# Patient Record
Sex: Male | Born: 1955 | Race: Black or African American | Hispanic: No | Marital: Single | Smoking: Never smoker
Health system: Southern US, Community
[De-identification: ages and names within clinical notes are randomized; demographics above are authoritative.]

## PROBLEM LIST (undated history)

## (undated) DIAGNOSIS — I10 Essential (primary) hypertension: Secondary | ICD-10-CM

## (undated) DIAGNOSIS — I509 Heart failure, unspecified: Secondary | ICD-10-CM

---

## 2004-08-25 ENCOUNTER — Other Ambulatory Visit: Payer: Self-pay

## 2004-08-25 ENCOUNTER — Emergency Department: Payer: Self-pay | Admitting: Emergency Medicine

## 2010-03-12 ENCOUNTER — Emergency Department: Payer: Self-pay | Admitting: Emergency Medicine

## 2010-10-06 ENCOUNTER — Inpatient Hospital Stay: Payer: Self-pay | Admitting: Internal Medicine

## 2010-10-07 DIAGNOSIS — R7989 Other specified abnormal findings of blood chemistry: Secondary | ICD-10-CM

## 2010-10-07 DIAGNOSIS — R0602 Shortness of breath: Secondary | ICD-10-CM

## 2010-10-07 DIAGNOSIS — I517 Cardiomegaly: Secondary | ICD-10-CM

## 2010-10-23 ENCOUNTER — Encounter: Payer: Self-pay | Admitting: Cardiovascular Disease

## 2013-01-01 ENCOUNTER — Emergency Department: Payer: Self-pay | Admitting: Emergency Medicine

## 2013-01-01 LAB — COMPREHENSIVE METABOLIC PANEL
Albumin: 3.7 g/dL (ref 3.4–5.0)
Anion Gap: 7 (ref 7–16)
Bilirubin,Total: 0.9 mg/dL (ref 0.2–1.0)
Calcium, Total: 8.8 mg/dL (ref 8.5–10.1)
Co2: 28 mmol/L (ref 21–32)
Creatinine: 0.8 mg/dL (ref 0.60–1.30)
Osmolality: 280 (ref 275–301)
Potassium: 3.8 mmol/L (ref 3.5–5.1)
SGOT(AST): 220 U/L — ABNORMAL HIGH (ref 15–37)
Sodium: 141 mmol/L (ref 136–145)
Total Protein: 8.6 g/dL — ABNORMAL HIGH (ref 6.4–8.2)

## 2013-01-01 LAB — CBC WITH DIFFERENTIAL/PLATELET
Basophil #: 0.1 10*3/uL (ref 0.0–0.1)
Basophil %: 1.2 %
Eosinophil %: 1.8 %
Lymphocyte %: 20.5 %
Monocyte %: 12.8 %
Neutrophil %: 63.7 %
Platelet: 152 10*3/uL (ref 150–440)
RDW: 13.5 % (ref 11.5–14.5)

## 2013-01-01 LAB — TROPONIN I: Troponin-I: <0.02 ng/mL

## 2013-07-04 ENCOUNTER — Inpatient Hospital Stay: Payer: Self-pay | Admitting: Student

## 2013-07-04 LAB — COMPREHENSIVE METABOLIC PANEL
Alkaline Phosphatase: 79 U/L
Anion Gap: 7 (ref 7–16)
BUN: 8 mg/dL (ref 7–18)
Co2: 29 mmol/L (ref 21–32)
Creatinine: 0.98 mg/dL (ref 0.60–1.30)
Osmolality: 273 (ref 275–301)
Potassium: 3.6 mmol/L (ref 3.5–5.1)
SGOT(AST): 119 U/L — ABNORMAL HIGH (ref 15–37)
SGPT (ALT): 148 U/L — ABNORMAL HIGH (ref 12–78)
Total Protein: 8.5 g/dL — ABNORMAL HIGH (ref 6.4–8.2)

## 2013-07-04 LAB — CBC
HCT: 48.2 % (ref 40.0–52.0)
MCH: 32.6 pg (ref 26.0–34.0)
MCHC: 35.4 g/dL (ref 32.0–36.0)
Platelet: 140 10*3/uL — ABNORMAL LOW (ref 150–440)

## 2013-07-04 LAB — TROPONIN I: Troponin-I: <0.02 ng/mL

## 2013-07-04 LAB — CK TOTAL AND CKMB (NOT AT ARMC): CK, Total: 301 U/L — ABNORMAL HIGH (ref 35–232)

## 2013-07-05 LAB — CBC WITH DIFFERENTIAL/PLATELET
Basophil #: 0 10*3/uL (ref 0.0–0.1)
Basophil %: 0.6 %
Eosinophil #: 0 10*3/uL (ref 0.0–0.7)
HGB: 15.9 g/dL (ref 13.0–18.0)
Lymphocyte #: 0.3 10*3/uL — ABNORMAL LOW (ref 1.0–3.6)
Lymphocyte %: 4.7 %
MCHC: 34.6 g/dL (ref 32.0–36.0)
Monocyte #: 0 x10 3/mm — ABNORMAL LOW (ref 0.2–1.0)
Neutrophil #: 6.9 10*3/uL — ABNORMAL HIGH (ref 1.4–6.5)
RBC: 4.97 10*6/uL (ref 4.40–5.90)
RDW: 13.1 % (ref 11.5–14.5)
WBC: 7.3 10*3/uL (ref 3.8–10.6)

## 2013-07-05 LAB — COMPREHENSIVE METABOLIC PANEL
Albumin: 3.2 g/dL — ABNORMAL LOW (ref 3.4–5.0)
Alkaline Phosphatase: 68 U/L
Anion Gap: 9 (ref 7–16)
BUN: 11 mg/dL (ref 7–18)
Calcium, Total: 8.5 mg/dL (ref 8.5–10.1)
Co2: 24 mmol/L (ref 21–32)
Creatinine: 1.06 mg/dL (ref 0.60–1.30)
EGFR (African American): >60
EGFR (Non-African Amer.): >60
Potassium: 3.8 mmol/L (ref 3.5–5.1)
SGOT(AST): 95 U/L — ABNORMAL HIGH (ref 15–37)
SGPT (ALT): 137 U/L — ABNORMAL HIGH (ref 12–78)
Sodium: 135 mmol/L — ABNORMAL LOW (ref 136–145)
Total Protein: 8.3 g/dL — ABNORMAL HIGH (ref 6.4–8.2)

## 2013-07-05 LAB — TROPONIN I: Troponin-I: <0.02 ng/mL

## 2013-07-05 LAB — TSH: Thyroid Stimulating Horm: 0.599 u[IU]/mL

## 2014-01-20 ENCOUNTER — Inpatient Hospital Stay: Payer: Self-pay | Admitting: Internal Medicine

## 2014-01-20 LAB — COMPREHENSIVE METABOLIC PANEL
ALK PHOS: 70 U/L
Albumin: 3.6 g/dL (ref 3.4–5.0)
Anion Gap: 9 (ref 7–16)
BILIRUBIN TOTAL: 0.9 mg/dL (ref 0.2–1.0)
BUN: 12 mg/dL (ref 7–18)
CO2: 26 mmol/L (ref 21–32)
Calcium, Total: 8.5 mg/dL (ref 8.5–10.1)
Chloride: 106 mmol/L (ref 98–107)
Creatinine: 1.21 mg/dL (ref 0.60–1.30)
EGFR (African American): >60
EGFR (Non-African Amer.): >60
Glucose: 132 mg/dL — ABNORMAL HIGH (ref 65–99)
Osmolality: 283 (ref 275–301)
POTASSIUM: 3.3 mmol/L — AB (ref 3.5–5.1)
SGOT(AST): 161 U/L — ABNORMAL HIGH (ref 15–37)
SGPT (ALT): 197 U/L — ABNORMAL HIGH (ref 12–78)
Sodium: 141 mmol/L (ref 136–145)
Total Protein: 9.2 g/dL — ABNORMAL HIGH (ref 6.4–8.2)

## 2014-01-20 LAB — CBC
HCT: 50.4 % (ref 40.0–52.0)
HGB: 17.3 g/dL (ref 13.0–18.0)
MCH: 32.8 pg (ref 26.0–34.0)
MCHC: 34.3 g/dL (ref 32.0–36.0)
MCV: 96 fL (ref 80–100)
Platelet: 149 10*3/uL — ABNORMAL LOW (ref 150–440)
RBC: 5.26 10*6/uL (ref 4.40–5.90)
RDW: 13.9 % (ref 11.5–14.5)
WBC: 7.5 10*3/uL (ref 3.8–10.6)

## 2014-01-20 LAB — TROPONIN I
Troponin-I: <0.02 ng/mL
Troponin-I: <0.02 ng/mL

## 2014-01-20 LAB — MAGNESIUM: Magnesium: 1.5 mg/dL — ABNORMAL LOW

## 2014-01-20 LAB — CK-MB
CK-MB: 7.6 ng/mL — ABNORMAL HIGH (ref 0.5–3.6)
CK-MB: 7.6 ng/mL — ABNORMAL HIGH (ref 0.5–3.6)
CK-MB: 8.6 ng/mL — AB (ref 0.5–3.6)

## 2014-01-20 LAB — LIPASE, BLOOD: Lipase: 383 U/L (ref 73–393)

## 2014-01-20 LAB — PRO B NATRIURETIC PEPTIDE: B-Type Natriuretic Peptide: 196 pg/mL — ABNORMAL HIGH (ref 0–125)

## 2014-01-21 LAB — BASIC METABOLIC PANEL
Anion Gap: 8 (ref 7–16)
BUN: 19 mg/dL — AB (ref 7–18)
CREATININE: 1.03 mg/dL (ref 0.60–1.30)
Calcium, Total: 8.7 mg/dL (ref 8.5–10.1)
Chloride: 103 mmol/L (ref 98–107)
Co2: 25 mmol/L (ref 21–32)
EGFR (Non-African Amer.): >60
Glucose: 126 mg/dL — ABNORMAL HIGH (ref 65–99)
OSMOLALITY: 276 (ref 275–301)
POTASSIUM: 3.9 mmol/L (ref 3.5–5.1)
Sodium: 136 mmol/L (ref 136–145)

## 2014-01-21 LAB — CBC WITH DIFFERENTIAL/PLATELET
Basophil #: 0 10*3/uL (ref 0.0–0.1)
Basophil %: 0.3 %
EOS ABS: 0 10*3/uL (ref 0.0–0.7)
EOS PCT: 0 %
HCT: 48.9 % (ref 40.0–52.0)
HGB: 16.4 g/dL (ref 13.0–18.0)
Lymphocyte #: 1 10*3/uL (ref 1.0–3.6)
Lymphocyte %: 7.7 %
MCH: 32.8 pg (ref 26.0–34.0)
MCHC: 33.5 g/dL (ref 32.0–36.0)
MCV: 98 fL (ref 80–100)
Monocyte #: 0.4 x10 3/mm (ref 0.2–1.0)
Monocyte %: 2.6 %
Neutrophil #: 12 10*3/uL — ABNORMAL HIGH (ref 1.4–6.5)
Neutrophil %: 89.4 %
PLATELETS: 165 10*3/uL (ref 150–440)
RBC: 5 10*6/uL (ref 4.40–5.90)
RDW: 13.9 % (ref 11.5–14.5)
WBC: 13.4 10*3/uL — ABNORMAL HIGH (ref 3.8–10.6)

## 2014-01-24 LAB — CREATININE, SERUM
Creatinine: 1.06 mg/dL (ref 0.60–1.30)
EGFR (African American): >60

## 2014-11-03 NOTE — Discharge Summary (Signed)
PATIENT NAME:  Joel Harrell, [name] MR#:  [id number] DATE OF BIRTH:  05-Jun-1956  DATE OF ADMISSION:  01/20/2014 DATE OF DISCHARGE:  01/24/2014  PRIMARY CARE PHYSICIAN: [facility].   FINAL DIAGNOSES:  1.  Acute respiratory failure, which improved.  2.  Asthma exacerbation.  3.  Accelerated hypertension.  4.  Hypokalemia.  5.  Hypomagnesemia.   MEDICATIONS ON DISCHARGE: Include: 1.  Multivitamin 1 tablet daily. 2.  Lisinopril 10 mg daily.  3.  Coreg 6.25 mg twice a day. 4.  Albuterol CFC 1 puff 4 times a day as needed for wheezing and shortness of breath. 5.  Prednisone 10 mg 4 tablets day 1 and 2,  three tablets day 3, 4, and 5, two tablets day 5 and 6, one tablet day 7 and 8, then stop. 6.  Amlodipine 5 mg 1 tablet daily. 7.  Ceftin 500 mg twice a day for 5 more days. 8.  Flovent CFC 220 mcg/inhalations 1 puff twice a day.   DIET: Low sodium diet, regular consistency.   ACTIVITY: As tolerated.   FOLLOW-UP: Follow up in [facility] weeks in [facility].   HOSPITAL COURSE: The patient was admitted 01/20/2014, discharged 01/24/2014. Came in with shortness of breath, was admitted with acute respiratory failure on BiPAP for asthma exacerbation and started on high-dose Solu-Medrol, Rocephin, Zithromax, and nebulizer treatments for his hypokalemia. This was replaced orally.  Hypomagnesemia replaced orally. 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   LABORATORY AND RADIOLOGICAL DATA: During the hospital course included an EKG that showed sinus tachycardia, left atrial enlargement. BNP 196. Troponin negative. Magnesium 1.5, lipase 383. White blood cell count 7.5, hemoglobin and hematocrit 7.3 and 50.4, platelet count of 149,000. Glucose 132, BUN 12, creatinine 1.21, sodium 141, potassium 3.3, chloride 106, CO2 26, calcium 8.5. Liver function tests:  ALT elevated at 197, AST up at 161, total protein 9.2,  albumin 3.6. ABG shows a pH of 7.43, pCO2 36, pO2 79, bicarbonate 23.9, oxygen saturation 97.6 and that was on BiPAP. Chest x-ray showed no active disease. Troponin negative. Creatinine upon discharge 1.06.   HOSPITAL COURSE PER PROBLEM LIST:  1.  For the patient's acute respiratory failure, initially requiring BiPAP in order to move air, the patient had improved to the time of discharge where he was not requiring oxygen at all.   2.  Asthma exacerbation: Patient was not moving air upon admission.  Upon discharge was moving better air. Slight expiratory wheeze. The patient was on high-dose steroids  throughout the entire hospital course, and will be put on a steroid taper, given a Flovent and albuterol inhaler upon discharge.   3.  Accelerated hypertension, likely secondary to respiratory failure. I did add amlodipine to his lisinopril and Coreg.   4.  For his hypokalemia, this was replaced during the hospital stay.   5.  For his hypomagnesemia, this was replaced during the hospital stay.   TIME SPENT ON DISCHARGE: 35 minutes.    ____________________________ Herschell Dimesichard J. Renae GlossWieting, MD rjw:ts D: 01/24/2014 16:15:21 ET T: 01/24/2014 17:19:17 ET JOB#: [id number]  cc: Herschell Dimesichard J. Renae GlossWieting, MD, <Dictator> [facility] Deitra Craine Jeanella AntonJ Vale Peraza MD ELECTRONICALLY SIGNED 01/25/2014 12:41

## 2014-11-03 NOTE — Discharge Summary (Signed)
PATIENT NAME:  Joel Harrell, Joel Harrell MR#:  [id number] DATE OF BIRTH:  17-Mar-1956  DATE OF ADMISSION:  07/04/2013 DATE OF DISCHARGE:  07/06/2013  PRIMARY CARE PHYSICIAN:  None.   CHIEF COMPLAINT:  Shortness of breath, palpitations.   DISCHARGE DIAGNOSES:  1.  Acute on chronic obstructive pulmonary disease/asthma exacerbation.  2.  Tachycardia, sinus in nature, likely secondary to epinephrine use.  3.  Chronic diastolic congestive heart failure with ejection fraction of 45% to 50%.  4.  Hypertension.  5.  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   DISCHARGE MEDICATIONS:  Multivitamin 1 tab daily, lisinopril 10 mg daily, Coreg 6.25 mg 2 times a day, Advair 250/50 mcg 1 puff 2 times a day, prednisone taper 50 mg daily and taper by 10 mg until done in 5 days, albuterol 2 puffs 4 times a day as needed for wheezing and shortness of breath, azithromycin 3 pack 500 mg once a day.   DIET:  Low sodium.   ACTIVITY:  As tolerated.   FOLLOWUP:  Please follow with [facility] within 1 to 2 weeks.   SIGNIFICANT LABORATORIES AND IMAGING:  Initial BUN 8, creatinine 0.98, BNP 460. AST 119, ALT 148. Troponin is negative x3. TSA was 0.599. White count of 6 on admission, hemoglobin 17, platelets 140. An x-ray of the chest, PA/lateral, showing no acute cardiopulmonary abnormalities.   HISTORY OF PRESENT ILLNESS AND HOSPITAL COURSE:  For full details of H and P, please see the dictation on December 23rd by Dr. Elisabeth PigeonVachhani, but briefly this is a 59 year old asthmatic with history of COPD, ongoing tobacco, who also has a history of systolic CHF, who has not followed with a physician for several months, ran out of medications, and comes in with palpitations. Of note, he did not have any more inhalers. Previously, he was on Advair and albuterol, and went to [employer], got over-the-counter racemic epinephrine inhalers, took 6 to 7 inhalations since the day prior to admission. He started to have some jitteriness, tachycardia,  palpitations, and came into the hospital. Was noted to have sinus tachycardia, received Cardizem, as well as some metoprolol. He was admitted to the hospitalist service for acute on chronic COPD/asthma exacerbation, started on IV steroids and nebulizers, montelukast, and Levaquin, as well as oxygen. In regards to the palpitations, he was started on metoprolol. He was transitioned from metoprolol to Coreg given his CHF, and slowly he improved. He did not have any significant fevers or leukocytosis to suggest he had pneumonia. He did well over the next couple of days and was taken off of the oxygen, and at this point, has been on lisinopril and Coreg in regards to the chronic systolic CHF. His acute asthma/COPD exacerbation has significantly improved. He is not hypoxic, and at this point, he will be discharged with Ventolin and Advair with azithromycin and a prednisone taper. His palpitations have resolved, and he will be discharged on Coreg. In regards to his elevated LFTs, there are transaminases in nature, and this is likely secondary to chronic hep C, and he will be following with [facility] as an outpatient, as he has lost his insurance. His chronic systolic CHF is stable. He is not volume overloaded.   TOTAL TIME SPENT:  35 minutes.   CODE STATUS:  The patient is FULL CODE.    ____________________________ Krystal EatonShayiq Shaquinta Peruski, MD sa:ms D: 07/06/2013 15:30:41 ET T: 07/06/2013 19:08:05 ET JOB#: [id number]  cc: Krystal EatonShayiq Lea Walbert, MD, <Dictator> Krystal EatonSHAYIQ Natassja Ollis MD ELECTRONICALLY SIGNED 07/25/2013 11:18

## 2014-11-03 NOTE — H&P (Signed)
PATIENT NAME:  Joel Harrell, Joel Harrell MR#:  [id number] DATE OF BIRTH:  1955-09-22  DATE OF ADMISSION:  07/04/2013  PRIMARY CARE PHYSICIAN: None.   REFERRING EMERGENCY ROOM PHYSICIAN: Dr. Daryel November.   CHIEF COMPLAINT: Shortness of breath and palpitations.   HISTORY OF PRESENT ILLNESS: A 59 year old male with past history of asthma and secondhand smoke exposure, questionable COPD, CHF and hypertension, who lost his job a few months ago and so is not following with any doctor and not taking any medication. Started feeling some shortness of breath since yesterday afternoon. He did not have his albuterol inhaler, so he went to [employer] and got over-the-counter racemic epinephrine inhalers and he took almost 6 to 7 of inhalations since yesterday evening until today morning. His shortness of breath did not get relieved much, but he started feeling palpitations and anxious and shaking since then, and so decided to come to the Emergency Room. In the ER, he is given 1 injection of steroid and 1 nebulizer treatment with bronchodilator, but it did not help much. Given 1 injection of Cardizem to slow down his heart rate without much help, so admitted to medical service for further management. ER physician is giving 1 more injection of metoprolol to slow down his heart rate. On further questioning, he denies any similar episode in the recent past. Denies any fever, but states that he has been shaking since today morning.   REVIEW OF SYSTEMS:     CONSTITUTIONAL: Negative for fever, fatigue, weakness, pain or weight loss.  EYES: No blurring, double vision or redness.  EARS, NOSE, THROAT: No tinnitus, ear pain or hearing loss.  RESPIRATORY: No cough, wheezing, hemoptysis, but has some shortness of breath.  CARDIOVASCULAR: No chest pain, orthopnea, edema or arrhythmia, but has palpitations.  GASTROINTESTINAL: No nausea, vomiting, diarrhea, abdominal pain.  GENITOURINARY: No dysuria, hematuria or increased frequency.   ENDOCRINE: No increased sweating. No heat or cold intolerance.  SKIN: No acne, rashes or any lesions on the skin.  MUSCULOSKELETAL: No pain or swelling in the joints.  NEUROLOGICAL: No numbness, weakness or vertigo, but has tremors and shaking.  PSYCHIATRIC: Not any history of anxiety, insomnia or bipolar disorder, but feeling a little anxious today.   PAST MEDICAL HISTORY: Asthma, CHF and hypertension, questionable COPD due to secondhand smoke exposure.   MEDICATIONS. Currently, at home, none.  ALLERGIES: None.   PAST SURGICAL HISTORY: None.   FAMILY HISTORY: Father died at 14 of MI. Mother died of unknown cancer at age 80. Brother had MI at 11 years of age and some kind of cancer. He is undergoing chemotherapy.   SOCIAL HISTORY: Has a monogamous girlfriend. He has 3 children. Does not smoke. No drink. Does not use any illegal drug use. 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   PHYSICAL EXAMINATION:  VITAL SIGNS:  In the ER, temperature 98.8, pulse 133, respirations 24 to 30 and blood pressure went up to 194/123, which came down to 160/110. Oxygen saturation 94 on 2 liters nasal cannula supplementation.  GENERAL: The patient is alert and oriented to time, place and person, appeared anxious and shaking.  HEENT: Head and neck atraumatic. Conjunctiva pink. Oral mucosa moist.  NECK: Supple. No JVD.  RESPIRATORY: Bilateral equal air entry. Some wheezing present. No crepitations.  CARDIOVASCULAR: S1, S2 present. Tachycardia, regular. No murmur appreciated.  SKIN: No rashes.  ABDOMEN: Soft, nontender. Bowel sounds present. No organomegaly.  EXTREMITIES:  Legs: No edema.  NEUROLOGICAL: Power 5/5. Follows commands.  Moves all 4 limbs. Overall shaking in limbs.  MUSCULOSKELETAL:  Joints: No swelling or tenderness.  PSYCHIATRIC: Appears anxious currently.   IMPORTANT LABORATORY RESULTS: BNP 460. Glucose 115, BUN 8, creatinine 0.98, sodium 137, potassium 3.6, chloride 101 and  CO2 of 29. Calcium is 8.5. Total protein 8.5, albumin 3.4, bilirubin total 0.9, alkaline phosphatase 79, SGOT 119, SGPT 148. Troponin less than 0.02.   WBC 6.0, hemoglobin 17, platelet count 140 and MCV 92. Chest x-ray, PA and lateral, no acute cardiopulmonary abnormality seen. EKG shows sinus tachycardia with a heart rate of 131.   ASSESSMENT AND PLAN: A 59 year old male with history of asthma and questionable chronic obstructive pulmonary disease, secondhand smoke exposure, congestive heart failure, hypertension, who came to the Emergency Room after feeling short of breath since yesterday and has used racemic epinephrine inhalation multiple times over the counter since yesterday, now feeling anxious and palpitations. Heart rate is ranging 130 to 140, supraventricular tachycardia.  1.  Chronic obstructive pulmonary disease/asthma exacerbation. We will give IV steroid inhaled  nebulizers and montelukast, and we will also give Levaquin to him and oxygen on as needed basis.  2.  Palpitations and tachycardia, appears to be having anxiety secondary to excessive epinephrine use. There is no effect to Cardizem injection. We will give metoprolol injection, and then put him on oral metoprolol. We will follow troponins and monitor him on telemetry.  3.  History of congestive heart failure. Ejection fraction 45%. Currently no finding of exacerbation. Stable symptoms. Not taking any medication. We will give beta blockers.  4.  Hypertension. We will put him on metoprolol for further management.   CODE STATUS: FULL CODE.   TOTAL TIME SPENT: 50 minutes in this admission.   We will still have to evaluate the effect of metoprolol injection. If not able to control, then we might need to start him on IV drip to control his heart rate better and his anxiety symptoms. We will wait for the response and decide later on.    ____________________________ Hope PigeonVaibhavkumar G. Elisabeth PigeonVachhani, MD vgv:dmm D: 07/04/2013 21:37:10  ET T: 07/04/2013 22:03:02 ET JOB#: [id number]  cc: Hope PigeonVaibhavkumar G. Elisabeth PigeonVachhani, MD, <Dictator> Altamese DillingVAIBHAVKUMAR Bartley Vuolo MD ELECTRONICALLY SIGNED 07/16/2013 21:56

## 2014-11-03 NOTE — H&P (Signed)
PATIENT NAME:  Joel Harrell, [place] MR#:  [id number] DATE OF BIRTH:  [birth year]  DATE OF ADMISSION:  01/20/2014  PRIMARY CARE PHYSICIAN:  None.   REFERRING PHYSICIAN:  Dr. York CeriseForbach.   CHIEF COMPLAINT:  Shortness of breath.   HISTORY OF PRESENT ILLNESS:  Joel Harrell is a 59 year old male with a history of hypertension, asthma, congestive heart failure who comes to the Emergency Department with complaints of severe shortness of breath.  The patient started to experience runny nose followed by cough with productive sputum.  The shortness of breath has been gradually getting worse.  Used multiple inhalers without much improvement.  Tonight experiencing severe shortness of breath.  Concerning this, called EMS and is brought to the Emergency Department.  The patient was in severe respiratory distress.  The patient was initially placed on BiPAP.  Received multiple breathing treatments and magnesium with some improvement.  The patient is able to speak full sentences currently.  The patient is also found to have low potassium of 3.3 and magnesium of 1.5.   PAST MEDICAL HISTORY: 1.  Asthma. 2.  Congestive heart failure.  3.  Hypertension.   PAST SURGICAL HISTORY:  None.   ALLERGIES:  No known drug allergies.   HOME MEDICATIONS: 1.  Multivitamin 1 tablet once a day.  2.  Lisinopril 10 mg daily.  3.  Coreg 6.25 mg 2 times a day.  4.  Albuterol 2 puffs 4 times a day as needed.   SOCIAL HISTORY:  No history of smoking.  Drinks alcohol two beers a day.  Denies using any illicit drugs.  Lives with his fiance.   FAMILY HISTORY:  Father died at the age of 61 with MI.  Mother died of unknown cancer at the age of 61.  Brother had an MI at the age of 256.   REVIEW OF SYSTEMS: CONSTITUTIONAL:  Has been experiencing generalized weakness.  EYES:  No change in vision.  EARS, NOSE, THROAT:  No change in hearing.  RESPIRATORY:  Has cough, shortness of breath.  CARDIOVASCULAR:  No chest pain, palpations.   GASTROINTESTINAL:  No nausea, vomiting, abdominal pain.  GENITOURINARY:  No dysuria or hematuria. HEMATOLOGY:  No easy bruising or bleeding.  SKIN:  No rash or lesions.  MUSCULOSKELETAL:  Has generalized body aches.  NEUROLOGIC:  No weakness or numbness in any part of the body.   PHYSICAL EXAMINATION: GENERAL:  This is a well-built, well-nourished, age-appropriate male lying down in the bed, not in distress, is able to speak full sentences.  VITAL SIGNS:  Temperature 98.9, pulse 124, blood pressure 155/112, respiratory rate of 24, oxygen saturation is 99% on BiPAP. HEENT:  Head normocephalic, atraumatic.  Eyes, no scleral icterus.  Conjunctivae normal.  Pupils equal and react to light.  Extraocular movements are intact.  Mucous membranes moist.  No pharyngeal erythema.  NECK:  Supple.  No lymphadenopathy.  No JVD.  No carotid bruit.  No thyromegaly.  CHEST:  Has no focal tenderness.  Bilateral diffuse wheezing, prolonged expiratory phase.  HEART:  S1, S2, regular, tachycardia.  ABDOMEN:  Bowel sounds plus.  Soft, nontender, nondistended.  No hepatosplenomegaly.  EXTREMITIES:  No pedal edema.  Pulses 2+.  SKIN:  No rash or lesions.  MUSCULOSKELETAL:  Good range of motion in all the extremities.  NEUROLOGIC:  The patient is alert, oriented to place, person, and time.  Cranial nerves II through XII intact.  Motor 5 by 5 in upper and lower extremities.   LABORATORY DATA:  ABGs  well within normal limits except for PaO2 of 79 on BiPAP.  Chest x-ray, one view portable:  No active disease of the chest.  Magnesium 1.5.  CBC is completely within normal limits.  CMP:  Potassium 3.3.  The rest of all the values are within normal limits.   ASSESSMENT AND PLAN:  Joel Harrell is a 59 year old with a known history of asthma comes with asthma exacerbation.  1.  Asthma exacerbation.  The patient is currently on BiPAP.  Continue with breathing treatments, Solu-Medrol.  Keep the patient on Rocephin and Zithromax  considering the patient's having productive sputum.  2.  Hypokalemia.  We will replace by mouth.  3.  Hypomagnesemia.  The patient is receiving currently magnesium.  We will recheck.  4.  Hypertension, uncontrolled, could be secondary to stress.  We will continue the home medications of lisinopril and Coreg.  We will also keep the patient on labetalol.  If the patient continues to have uncontrolled blood pressure, consider starting the patient on amlodipine.  5.  Keep the patient on deep vein thrombosis prophylaxis with Lovenox.   TIME SPENT:  55 minutes.    ____________________________ Susa Griffins, MD pv:ea D: 01/20/2014 04:12:07 ET T: 01/20/2014 05:08:50 ET JOB#: [id number]  cc: Susa Griffins, MD, <Dictator> Susa Griffins MD ELECTRONICALLY SIGNED 01/24/2014 0:05

## 2015-12-28 ENCOUNTER — Emergency Department: Payer: Self-pay

## 2015-12-28 ENCOUNTER — Emergency Department
Admission: EM | Admit: 2015-12-28 | Discharge: 2015-12-28 | Disposition: A | Discharge status: Home / Self Care | Payer: Self-pay | Attending: Emergency Medicine | Admitting: Emergency Medicine

## 2015-12-28 ENCOUNTER — Encounter: Payer: Self-pay | Admitting: *Deleted

## 2015-12-28 DIAGNOSIS — Z79899 Other long term (current) drug therapy: Secondary | ICD-10-CM | POA: Insufficient documentation

## 2015-12-28 DIAGNOSIS — I509 Heart failure, unspecified: Secondary | ICD-10-CM | POA: Insufficient documentation

## 2015-12-28 DIAGNOSIS — Z7982 Long term (current) use of aspirin: Secondary | ICD-10-CM | POA: Insufficient documentation

## 2015-12-28 DIAGNOSIS — J45901 Unspecified asthma with (acute) exacerbation: Secondary | ICD-10-CM | POA: Insufficient documentation

## 2015-12-28 LAB — CBC WITH DIFFERENTIAL/PLATELET
BASOS ABS: 0 10*3/uL (ref 0–0.1)
Basophils Relative: 1 %
Eosinophils Absolute: 0.4 10*3/uL (ref 0–0.7)
Eosinophils Relative: 9 %
HEMATOCRIT: 48.4 % (ref 40.0–52.0)
HEMOGLOBIN: 17.1 g/dL (ref 13.0–18.0)
Lymphocytes Relative: 29 %
Lymphs Abs: 1.4 10*3/uL (ref 1.0–3.6)
MCH: 32.1 pg (ref 26.0–34.0)
MCHC: 35.4 g/dL (ref 32.0–36.0)
MCV: 90.7 fL (ref 80.0–100.0)
MONO ABS: 0.5 10*3/uL (ref 0.2–1.0)
Monocytes Relative: 11 %
NEUTROS ABS: 2.3 10*3/uL (ref 1.4–6.5)
Neutrophils Relative %: 50 %
Platelets: 175 10*3/uL (ref 150–440)
RBC: 5.34 MIL/uL (ref 4.40–5.90)
RDW: 12.9 % (ref 11.5–14.5)
WBC: 4.6 10*3/uL (ref 3.8–10.6)

## 2015-12-28 LAB — COMPREHENSIVE METABOLIC PANEL
ALBUMIN: 3.9 g/dL (ref 3.5–5.0)
ALK PHOS: 67 U/L (ref 38–126)
ALT: 115 U/L — AB (ref 17–63)
ANION GAP: 10 (ref 5–15)
AST: 98 U/L — ABNORMAL HIGH (ref 15–41)
BUN: 9 mg/dL (ref 6–20)
CALCIUM: 8.8 mg/dL — AB (ref 8.9–10.3)
CO2: 22 mmol/L (ref 22–32)
Chloride: 103 mmol/L (ref 101–111)
Creatinine, Ser: 0.99 mg/dL (ref 0.61–1.24)
GFR calc Af Amer: >60 mL/min (ref >60)
GFR calc non Af Amer: >60 mL/min (ref >60)
GLUCOSE: 116 mg/dL — AB (ref 65–99)
Potassium: 3.4 mmol/L — ABNORMAL LOW (ref 3.5–5.1)
SODIUM: 135 mmol/L (ref 135–145)
Total Bilirubin: 0.9 mg/dL (ref 0.3–1.2)
Total Protein: 8.4 g/dL — ABNORMAL HIGH (ref 6.5–8.1)

## 2015-12-28 LAB — BRAIN NATRIURETIC PEPTIDE: B Natriuretic Peptide: 10 pg/mL (ref 0.0–100.0)

## 2015-12-28 LAB — TROPONIN I: Troponin I: <0.03 ng/mL (ref <0.031)

## 2015-12-28 LAB — MAGNESIUM: MAGNESIUM: 1.7 mg/dL (ref 1.7–2.4)

## 2015-12-28 MED ORDER — PREDNISONE 20 MG PO TABS: 40.0000 mg | ORAL_TABLET | Freq: Every day | ORAL | Status: DC

## 2015-12-28 MED ORDER — IPRATROPIUM-ALBUTEROL 0.5-2.5 (3) MG/3ML IN SOLN
3.0000 mL | Freq: Once | RESPIRATORY_TRACT | Status: AC
  Administered 2015-12-28: 3 mL via RESPIRATORY_TRACT
  Filled 2015-12-28: qty 3

## 2015-12-28 MED ORDER — METHYLPREDNISOLONE SODIUM SUCC 125 MG IJ SOLR
125.0000 mg | Freq: Once | INTRAMUSCULAR | Status: AC
  Administered 2015-12-28: 125 mg via INTRAVENOUS
  Filled 2015-12-28: qty 2

## 2015-12-28 MED ORDER — MONTELUKAST SODIUM 10 MG PO TABS: 10.0000 mg | ORAL_TABLET | Freq: Every day | ORAL | Status: DC

## 2015-12-28 MED ORDER — LISINOPRIL 20 MG PO TABS: 20.0000 mg | ORAL_TABLET | Freq: Every day | ORAL | Status: DC

## 2015-12-28 MED ORDER — ALBUTEROL SULFATE HFA 108 (90 BASE) MCG/ACT IN AERS: 2.0000 | INHALATION_SPRAY | Freq: Four times a day (QID) | RESPIRATORY_TRACT | Status: DC | PRN

## 2015-12-28 MED ORDER — ALBUTEROL SULFATE (2.5 MG/3ML) 0.083% IN NEBU: 2.5000 mg | INHALATION_SOLUTION | Freq: Four times a day (QID) | RESPIRATORY_TRACT | Status: DC | PRN

## 2015-12-28 NOTE — ED Notes (Signed)
Pt reports dizziness for 1 week , pt reports feeling short of breath, pt has a history of asthma

## 2015-12-28 NOTE — ED Provider Notes (Signed)
Time Seen: Approximately 0 900 I have reviewed the triage notes  Chief Complaint: Dizziness and Chest Pain   History of Present Illness: Joel Harrell is a 60 y.o. male *who states a history of congestive heart failure and asthma. Patient states he's had some increased shortness of breath over the past week. He's developed some mild left-sided chest discomfort he states which is typical for him when he develops asthma. He denies any new chest discomfort. He states he felt somewhat lightheaded without any syncopal episode. He denies any calf tenderness or swelling and no new pulmonary emboli risk factors. He denies any arm job back or flank discomfort. Denies any fever or chills or productive cough. Past Medical History  Diagnosis Date  . Asthma     There are no active problems to display for this patient.   History reviewed. No pertinent past surgical history.  History reviewed. No pertinent past surgical history.  Current Outpatient Rx  Name  Route  Sig  Dispense  Refill  . albuterol (PROVENTIL) (2.5 MG/3ML) 0.083% nebulizer solution   Nebulization   Take 2.5 mg by nebulization every 6 (six) hours as needed.           Marland Kitchen albuterol (PROVENTIL,VENTOLIN) 90 MCG/ACT inhaler   Inhalation   Inhale 2 puffs into the lungs every 6 (six) hours as needed.           Marland Kitchen aspirin 81 MG EC tablet   Oral   Take 81 mg by mouth daily.           . budesonide-formoterol (SYMBICORT) 160-4.5 MCG/ACT inhaler   Inhalation   Inhale 2 puffs into the lungs 2 (two) times daily.           Marland Kitchen lisinopril (PRINIVIL,ZESTRIL) 20 MG tablet   Oral   Take 20 mg by mouth daily.           . montelukast (SINGULAIR) 10 MG tablet   Oral   Take 10 mg by mouth at bedtime.           Marland Kitchen omeprazole (PRILOSEC) 20 MG capsule   Oral   Take 20 mg by mouth daily.           . predniSONE (DELTASONE) 20 MG tablet   Oral   Take 60 mg by mouth daily. ruce to  every 2 days until done             Allergies:  Review of patient's allergies indicates no known allergies.  Family History: Family History  Problem Relation Age of Onset  . Heart attack Father   . Cancer Mother     Social History: Social History  Substance Use Topics  . Smoking status: Never Smoker   . Smokeless tobacco: Never Used  . Alcohol Use: 4.2 oz/week    7 Cans of beer per week     Review of Systems:   10 point review of systems was performed and was otherwise negative:  Constitutional: No fever Eyes: No visual disturbances ENT: No sore throat, ear pain Cardiac: No chest pain Respiratory: Increasing shortness of breath and he has been using his inhaler at home. He is currently not on any steroid therapy. Abdomen: No abdominal pain, no vomiting, No diarrhea Endocrine: No weight loss, No night sweats Extremities: No peripheral edema, cyanosis Skin: No rashes, easy bruising Neurologic: No focal weakness, trouble with speech or swollowing Urologic: No dysuria, Hematuria, or urinary frequency   Physical Exam:  ED Triage Vitals  Enc Vitals Group     BP 12/28/15 0845 146/90 mmHg     Pulse Rate 12/28/15 0845 109     Resp 12/28/15 0845 20     Temp 12/28/15 0845 98.8 F (37.1 C)     Temp Source 12/28/15 0845 Oral     SpO2 12/28/15 0845 96 %     Weight 12/28/15 0843 232 lb (105.235 kg)     Height 12/28/15 0843 6\' 2"  (1.88 m)     Head Cir --      Peak Flow --      Pain Score 12/28/15 0841 6     Pain Loc --      Pain Edu? --      Excl. in GC? --     General: Awake , Alert , and Oriented times 3; GCS 15 Head: Normal cephalic , atraumatic Eyes: Pupils equal , round, reactive to light Nose/Throat: No nasal drainage, patent upper airway without erythema or exudate.  Neck: Supple, Full range of motion, No anterior adenopathy or palpable thyroid masses Lungs: Patient has an expiratory wheezing auscultated in all lung fields without rales or rhonchi Heart: Regular rate, regular rhythm without  murmurs , gallops , or rubs Abdomen: Soft, non tender without rebound, guarding , or rigidity; bowel sounds positive and symmetric in all 4 quadrants. No organomegaly .        Extremities: 2 plus symmetric pulses. No edema, clubbing or cyanosis Neurologic: normal ambulation, Motor symmetric without deficits, sensory intact Skin: warm, dry, no rashes   Labs:   All laboratory work was reviewed including any pertinent negatives or positives listed below:  Labs Reviewed  BRAIN NATRIURETIC PEPTIDE  COMPREHENSIVE METABOLIC PANEL  CBC WITH DIFFERENTIAL/PLATELET  TROPONIN I  MAGNESIUM  Laboratory work was reviewed and showed no clinically significant abnormalities. LFTs are slightly elevated.  EKG:  ED ECG REPORT I, Jennye MoccasinBrian S Quigley, the attending physician, personally viewed and interpreted this ECG.  Date: 12/28/2015 EKG Time: 0845 Rate: 105 Rhythm: Sinus tachycardia QRS Axis: normal Intervals: normal ST/T Wave abnormalities: normal Conduction Disturbances: none Narrative Interpretation: unremarkable No acute ischemic changes  Radiology: *      DG Chest Port 1 View (Final result) Result time: 12/28/15 09:36:25   Final result by Rad Results In Interface (12/28/15 09:36:25)   Narrative:   CLINICAL DATA: Shortness of breath.  EXAM: PORTABLE CHEST 1 VIEW  COMPARISON: Radiograph of January 20, 2014.  FINDINGS: The heart size and mediastinal contours are within normal limits. Both lungs are clear. No pneumothorax or pleural effusion is noted. The visualized skeletal structures are unremarkable.  IMPRESSION: No acute cardiopulmonary abnormality seen.   Electronically Signed By: Lupita RaiderJames Green Jr, M.D. On: 12/28/2015 09:36        I personally reviewed the radiologic studies  ED Course: ** Repeat exam shows clearing of majority of his wheezing. Patient admits that he is currently out of the vast majority of his medications at home. I prescribed him at least a  month's supply of 4 his nebulizer that he can use at home. He states he uses his sister's nebulizer. They apparently reside in the same location. Patient was also started on steroid therapy and was advised started the prescription for prednisone tomorrow. He was advised drink plenty of fluids and return here especially if he develops a fever, chest pain, increased shortness of breath or any other new concerns.  Assessment: Acute exacerbation of chronic asthma   F   Plan:  Outpatient management  Prescription for the patient's chronic blood pressure medication, asthma treatment, etc. Patient was advised to return immediately if condition worsens. Patient was advised to follow up with their primary care physician or other specialized physicians involved in their outpatient care. The patient and/or family member/power of attorney had laboratory results reviewed at the bedside. All questions and concerns were addressed and appropriate discharge instructions were distributed by the nursing staff.            Jennye Moccasin, MD 12/28/15 [phone]

## 2015-12-28 NOTE — ED Notes (Signed)
Reports mild dizziness and left side chest pain x 1 wk.  Some sob this am.  nodistress at this time, skin w/d with good color.  HS normal, mild wheezing noted. A&o, MAE

## 2015-12-28 NOTE — Discharge Instructions (Signed)
Asthma, Acute Bronchospasm °Acute bronchospasm caused by asthma is also referred to as an asthma attack. Bronchospasm means your air passages become narrowed. The narrowing is caused by inflammation and tightening of the muscles in the air tubes (bronchi) in your lungs. This can make it hard to breathe or cause you to wheeze and cough. °CAUSES °Possible triggers are: °· Animal dander from the skin, hair, or feathers of animals. °· Dust mites contained in house dust. °· Cockroaches. °· Pollen from trees or grass. °· Mold. °· Cigarette or tobacco smoke. °· Air pollutants such as dust, household cleaners, hair sprays, aerosol sprays, paint fumes, strong chemicals, or strong odors. °· Cold air or weather changes. Cold air may trigger inflammation. Winds increase molds and pollens in the air. °· Strong emotions such as crying or laughing hard. °· Stress. °· Certain medicines such as aspirin or beta-blockers. °· Sulfites in foods and drinks, such as dried fruits and wine. °· Infections or inflammatory conditions, such as a flu, cold, or inflammation of the nasal membranes (rhinitis). °· Gastroesophageal reflux disease (GERD). GERD is a condition where stomach acid backs up into your esophagus. °· Exercise or strenuous activity. °SIGNS AND SYMPTOMS  °· Wheezing. °· Excessive coughing, particularly at night. °· Chest tightness. °· Shortness of breath. °DIAGNOSIS  °Your health care provider will ask you about your medical history and perform a physical exam. A chest X-ray or blood testing may be performed to look for other causes of your symptoms or other conditions that may have triggered your asthma attack.  °TREATMENT  °Treatment is aimed at reducing inflammation and opening up the airways in your lungs.  Most asthma attacks are treated with inhaled medicines. These include quick relief or rescue medicines (such as bronchodilators) and controller medicines (such as inhaled corticosteroids). These medicines are sometimes  given through an inhaler or a nebulizer. Systemic steroid medicine taken by mouth or given through an IV tube also can be used to reduce the inflammation when an attack is moderate or severe. Antibiotic medicines are only used if a bacterial infection is present.  °HOME CARE INSTRUCTIONS  °· Rest. °· Drink plenty of liquids. This helps the mucus to remain thin and be easily coughed up. Only use caffeine in moderation and do not use alcohol until you have recovered from your illness. °· Do not smoke. Avoid being exposed to secondhand smoke. °· You play a critical role in keeping yourself in good health. Avoid exposure to things that cause you to wheeze or to have breathing problems. °· Keep your medicines up-to-date and available. Carefully follow your health care provider's treatment plan. °· Take your medicine exactly as prescribed. °· When pollen or pollution is bad, keep windows closed and use an air conditioner or go to places with air conditioning. °· Asthma requires careful medical care. See your health care provider for a follow-up as advised. If you are more than [redacted] weeks pregnant and you were prescribed any new medicines, let your obstetrician know about the visit and how you are doing. Follow up with your health care provider as directed. °· After you have recovered from your asthma attack, make an appointment with your outpatient doctor to talk about ways to reduce the likelihood of future attacks. If you do not have a doctor who manages your asthma, make an appointment with a primary care doctor to discuss your asthma. °SEEK IMMEDIATE MEDICAL CARE IF:  °· You are getting worse. °· You have trouble breathing. If severe, call your local   emergency services (911 in the [place]  You develop chest pain or discomfort.  You are vomiting.  You are not able to keep fluids down.  You are coughing up yellow, green, brown, or bloody sputum.  You have a fever and your symptoms suddenly get worse.  You have  trouble swallowing. MAKE SURE YOU:   Understand these instructions.  Will watch your condition.  Will get help right away if you are not doing well or get worse.   This information is not intended to replace advice given to you by your health care provider. Make sure you discuss any questions you have with your health care provider.   Document Released: 10/14/2006 Document Revised: 07/04/2013 Document Reviewed: 01/04/2013 [employer] Interactive Patient Education [employer]   Please return immediately if condition worsens. Please contact her primary physician or the physician you were given for referral. If you have any specialist physicians involved in her treatment and plan please also contact them. Thank you for using [facility].  Please continue with rescue inhalers at home. Prednisone prescription and is to start tomorrow.

## 2016-01-04 ENCOUNTER — Encounter: Payer: Self-pay | Admitting: Emergency Medicine

## 2016-01-04 ENCOUNTER — Other Ambulatory Visit: Payer: Self-pay

## 2016-01-04 DIAGNOSIS — Z7952 Long term (current) use of systemic steroids: Secondary | ICD-10-CM | POA: Insufficient documentation

## 2016-01-04 DIAGNOSIS — Z7982 Long term (current) use of aspirin: Secondary | ICD-10-CM | POA: Insufficient documentation

## 2016-01-04 DIAGNOSIS — J45901 Unspecified asthma with (acute) exacerbation: Secondary | ICD-10-CM | POA: Insufficient documentation

## 2016-01-04 DIAGNOSIS — R079 Chest pain, unspecified: Secondary | ICD-10-CM | POA: Insufficient documentation

## 2016-01-04 DIAGNOSIS — I509 Heart failure, unspecified: Secondary | ICD-10-CM | POA: Insufficient documentation

## 2016-01-04 DIAGNOSIS — I110 Hypertensive heart disease with heart failure: Secondary | ICD-10-CM | POA: Insufficient documentation

## 2016-01-04 NOTE — ED Notes (Signed)
Pt reports feeling dizzy, short of breath, wheezy and his blood pressure has been high. Pt reports seen here last week and put on lisinopril for his blood pressure. Pt did not follow up with a PCP because he could not afford to. Pt reports hx of htn and chf in the past.

## 2016-01-05 ENCOUNTER — Emergency Department
Admission: EM | Admit: 2016-01-05 | Discharge: 2016-01-05 | Disposition: A | Discharge status: Home / Self Care | Payer: Self-pay | Attending: Emergency Medicine | Admitting: Emergency Medicine

## 2016-01-05 ENCOUNTER — Emergency Department: Payer: Self-pay

## 2016-01-05 DIAGNOSIS — J45901 Unspecified asthma with (acute) exacerbation: Secondary | ICD-10-CM

## 2016-01-05 DIAGNOSIS — I10 Essential (primary) hypertension: Secondary | ICD-10-CM

## 2016-01-05 HISTORY — DX: Essential (primary) hypertension: I10

## 2016-01-05 LAB — URINALYSIS COMPLETE WITH MICROSCOPIC (ARMC ONLY)
Bacteria, UA: NONE SEEN
Bilirubin Urine: NEGATIVE
Glucose, UA: NEGATIVE mg/dL
HGB URINE DIPSTICK: NEGATIVE
KETONES UR: NEGATIVE mg/dL
Leukocytes, UA: NEGATIVE
NITRITE: NEGATIVE
PH: 6 (ref 5.0–8.0)
PROTEIN: NEGATIVE mg/dL
SPECIFIC GRAVITY, URINE: 1.011 (ref 1.005–1.030)

## 2016-01-05 LAB — CBC
HCT: 50.2 % (ref 40.0–52.0)
HEMOGLOBIN: 17.8 g/dL (ref 13.0–18.0)
MCH: 32.8 pg (ref 26.0–34.0)
MCHC: 35.5 g/dL (ref 32.0–36.0)
MCV: 92.6 fL (ref 80.0–100.0)
Platelets: 195 10*3/uL (ref 150–440)
RBC: 5.42 MIL/uL (ref 4.40–5.90)
RDW: 12.8 % (ref 11.5–14.5)
WBC: 6.1 10*3/uL (ref 3.8–10.6)

## 2016-01-05 LAB — BASIC METABOLIC PANEL
ANION GAP: 9 (ref 5–15)
BUN: 16 mg/dL (ref 6–20)
CALCIUM: 9.1 mg/dL (ref 8.9–10.3)
CO2: 30 mmol/L (ref 22–32)
Chloride: 99 mmol/L — ABNORMAL LOW (ref 101–111)
Creatinine, Ser: 0.99 mg/dL (ref 0.61–1.24)
Glucose, Bld: 98 mg/dL (ref 65–99)
Potassium: 3.5 mmol/L (ref 3.5–5.1)
Sodium: 138 mmol/L (ref 135–145)

## 2016-01-05 LAB — TROPONIN I: Troponin I: <0.03 ng/mL (ref <0.031)

## 2016-01-05 LAB — GLUCOSE, CAPILLARY: Glucose-Capillary: 103 mg/dL — ABNORMAL HIGH (ref 65–99)

## 2016-01-05 LAB — BRAIN NATRIURETIC PEPTIDE: B Natriuretic Peptide: 30 pg/mL (ref 0.0–100.0)

## 2016-01-05 MED ORDER — IPRATROPIUM-ALBUTEROL 0.5-2.5 (3) MG/3ML IN SOLN
3.0000 mL | Freq: Once | RESPIRATORY_TRACT | Status: AC
  Administered 2016-01-05: 3 mL via RESPIRATORY_TRACT
  Filled 2016-01-05: qty 3

## 2016-01-05 MED ORDER — ALBUTEROL SULFATE (2.5 MG/3ML) 0.083% IN NEBU
INHALATION_SOLUTION | RESPIRATORY_TRACT | Status: AC
  Filled 2016-01-05: qty 3

## 2016-01-05 MED ORDER — ACETAMINOPHEN 325 MG PO TABS
650.0000 mg | ORAL_TABLET | Freq: Once | ORAL | Status: AC
  Administered 2016-01-05: 650 mg via ORAL
  Filled 2016-01-05: qty 2

## 2016-01-05 NOTE — ED Notes (Signed)
Pt c/o of chest pain, first occurrence last week, and again Saturday (6/24) morning in the left chest described as sharp/pressure. Pt reports dizziness, diaphoresis, and SOB with onset of chest pain. Pt denies radiation, nausea/vomiting/weakness. Pt reports [facility] Wednesday for same symptoms and discharged with albuterol, singular, prednisone and lisinopril. Pt reports symptoms had completely resolved, however came back yesterday morning.

## 2016-01-05 NOTE — Discharge Instructions (Signed)
You have been seen in the Emergency Department (ED) today for shortness of breath and some chest pain.  As we have discussed todays test results are normal, but you may require further testing.  Please follow up with the recommended doctor as instructed above in these documents regarding todays emergent visit and your recent symptoms to discuss further management.  Continue to take your regular medications. If you are not doing so already, please also take a daily baby aspirin (81 mg), at least until you follow up with your doctor.  Return to the Emergency Department (ED) if you experience any further chest pain/pressure/tightness, difficulty breathing, or sudden sweating, or other symptoms that concern you.   Chest Pain (Nonspecific) It is often hard to give a specific diagnosis for the cause of chest pain. There is always a chance that your pain could be related to something serious, such as a heart attack or a blood clot in the lungs. You need to follow up with your health care provider for further evaluation. CAUSES   Heartburn.  Pneumonia or bronchitis.  Anxiety or stress.  Inflammation around your heart (pericarditis) or lung (pleuritis or pleurisy).  A blood clot in the lung.  A collapsed lung (pneumothorax). It can develop suddenly on its own (spontaneous pneumothorax) or from trauma to the chest.  Shingles infection (herpes zoster virus). The chest wall is composed of bones, muscles, and cartilage. Any of these can be the source of the pain.  The bones can be bruised by injury.  The muscles or cartilage can be strained by coughing or overwork.  The cartilage can be affected by inflammation and become sore (costochondritis). DIAGNOSIS  Lab tests or other studies may be needed to find the cause of your pain. Your health care provider may have you take a test called an ambulatory electrocardiogram (ECG). An ECG records your heartbeat patterns over a 24-hour period. You may  also have other tests, such as:  Transthoracic echocardiogram (TTE). During echocardiography, sound waves are used to evaluate how blood flows through your heart.  Transesophageal echocardiogram (TEE).  Cardiac monitoring. This allows your health care provider to monitor your heart rate and rhythm in real time.  Holter monitor. This is a portable device that records your heartbeat and can help diagnose heart arrhythmias. It allows your health care provider to track your heart activity for several days, if needed.  Stress tests by exercise or by giving medicine that makes the heart beat faster. TREATMENT   Treatment depends on what may be causing your chest pain. Treatment may include:  Acid blockers for heartburn.  Anti-inflammatory medicine.  Pain medicine for inflammatory conditions.  Antibiotics if an infection is present.  You may be advised to change lifestyle habits. This includes stopping smoking and avoiding alcohol, caffeine, and chocolate.  You may be advised to keep your head raised (elevated) when sleeping. This reduces the chance of acid going backward from your stomach into your esophagus. Most of the time, nonspecific chest pain will improve within 2-3 days with rest and mild pain medicine.  HOME CARE INSTRUCTIONS   If antibiotics were prescribed, take them as directed. Finish them even if you start to feel better.  For the next few days, avoid physical activities that bring on chest pain. Continue physical activities as directed.  Do not use any tobacco products, including cigarettes, chewing tobacco, or electronic cigarettes.  Avoid drinking alcohol.  Only take medicine as directed by your health care provider.  Follow your  health care provider's suggestions for further testing if your chest pain does not go away.  Keep any follow-up appointments you made. If you do not go to an appointment, you could develop lasting (chronic) problems with pain. If there is  any problem keeping an appointment, call to reschedule. SEEK MEDICAL CARE IF:   Your chest pain does not go away, even after treatment.  You have a rash with blisters on your chest.  You have a fever. SEEK IMMEDIATE MEDICAL CARE IF:   You have increased chest pain or pain that spreads to your arm, neck, jaw, back, or abdomen.  You have shortness of breath.  You have an increasing cough, or you cough up blood.  You have severe back or abdominal pain.  You feel nauseous or vomit.  You have severe weakness.  You faint.  You have chills. This is an emergency. Do not wait to see if the pain will go away. Get medical help at once. Call your local emergency services (911 in [place] Do not drive yourself to the hospital. MAKE SURE YOU:   Understand these instructions.  Will watch your condition.  Will get help right away if you are not doing well or get worse. Document Released: 04/08/2005 Document Revised: 07/04/2013 Document Reviewed: 02/02/2008 [facility] Patient Information 2015 [place] [place]. This information is not intended to replace advice given to you by your health care provider. Make sure you discuss any questions you have with your health care provider.

## 2016-01-05 NOTE — ED Provider Notes (Signed)
[facility] Emergency Department Provider Note  ____________________________________________  Time seen: Approximately 4:28 AM  I have reviewed the triage vital signs and the nursing notes.   HISTORY  Chief Complaint Hypertension; Dizziness; and Wheezing    HPI Joel Harrell is a 60 y.o. male with history of asthma on nebulizers, hypertension, and mild CHF not on diuretics new presents for evaluation of chest pain and shortness of breath and wheezing.  He reports that he was seen last week for similar symptoms and discharged with lisinopril and some nebulizer medication as well as some prednisone.  He was feeling better for days but then had acute onset of worsening shortness of breath and wheezing about 24 hours ago that felt similar to prior but also with some mild, intermittent, brief left sided sharp chest pain.  That is completely resolved.  Nothing particular made it better or worse.  He denies fever/chills, nausea, vomiting, abdominal pain.  He has been taking his lisinopril which is a new prescription.  He has not yet followed up with a primary care doctor due to financial reasons.   Past Medical History  Diagnosis Date  . Asthma   . Hypertension     There are no active problems to display for this patient.   No past surgical history on file.  Current Outpatient Rx  Name  Route  Sig  Dispense  Refill  . albuterol (PROVENTIL HFA;VENTOLIN HFA) 108 (90 Base) MCG/ACT inhaler   Inhalation   Inhale 2 puffs into the lungs every 6 (six) hours as needed for wheezing or shortness of breath.   1 Inhaler   2   . albuterol (PROVENTIL) (2.5 MG/3ML) 0.083% nebulizer solution   Nebulization   Take 3 mLs (2.5 mg total) by nebulization every 6 (six) hours as needed for wheezing or shortness of breath.   75 mL   12   . aspirin 81 MG EC tablet   Oral   Take 81 mg by mouth daily.           Marland Kitchen lisinopril (PRINIVIL,ZESTRIL) 20 MG tablet   Oral   Take 1  tablet (20 mg total) by mouth daily.   30 tablet   1   . montelukast (SINGULAIR) 10 MG tablet   Oral   Take 1 tablet (10 mg total) by mouth at bedtime.   30 tablet   2   . predniSONE (DELTASONE) 20 MG tablet   Oral   Take 2 tablets (40 mg total) by mouth daily.   10 tablet   0   . budesonide-formoterol (SYMBICORT) 160-4.5 MCG/ACT inhaler   Inhalation   Inhale 2 puffs into the lungs 2 (two) times daily.           Marland Kitchen omeprazole (PRILOSEC) 20 MG capsule   Oral   Take 20 mg by mouth daily.             Allergies Review of patient's allergies indicates no known allergies.  Family History  Problem Relation Age of Onset  . Heart attack Father   . Cancer Mother     Social History Social History  Substance Use Topics  . Smoking status: Never Smoker   . Smokeless tobacco: Never Used  . Alcohol Use: 4.2 oz/week    7 Cans of beer per week    Review of Systems Constitutional: No fever/chills Eyes: No visual changes. ENT: No sore throat. Cardiovascular: +chest pain. Respiratory: +shortness of breath. Gastrointestinal: No abdominal pain.  No  nausea, no vomiting.  No diarrhea.  No constipation. Genitourinary: Negative for dysuria. Musculoskeletal: Negative for back pain. Skin: Negative for rash. Neurological: Negative for headaches, focal weakness or numbness.  10-point ROS otherwise negative.  ____________________________________________   PHYSICAL EXAM:  VITAL SIGNS: ED Triage Vitals  Enc Vitals Group     BP 01/04/16 2332 178/108 mmHg     Pulse Rate 01/04/16 2332 97     Resp 01/05/16 0241 18     Temp 01/04/16 2332 98.5 F (36.9 C)     Temp Source 01/04/16 2332 Oral     SpO2 01/04/16 2332 97 %     Weight 01/04/16 2332 232 lb (105.235 kg)     Height 01/04/16 2332 6\' 3"  (1.905 m)     Head Cir --      Peak Flow --      Pain Score 01/04/16 2353 7     Pain Loc --      Pain Edu? --      Excl. in GC? --     Constitutional: Alert and oriented. Well  appearing and in no acute distress. Eyes: Conjunctivae are normal. PERRL. EOMI. Head: Atraumatic. Nose: No congestion/rhinnorhea. Mouth/Throat: Mucous membranes are moist.  Oropharynx non-erythematous. Neck: No stridor.  No meningeal signs.   Cardiovascular: Normal rate, regular rhythm. Good peripheral circulation. Grossly normal heart sounds.   Respiratory: Normal respiratory effort.  No retractions. Mild expiratory wheezing throughout Gastrointestinal: Soft and nontender. No distention.  Musculoskeletal: No lower extremity tenderness nor edema. No gross deformities of extremities. Neurologic:  Normal speech and language. No gross focal neurologic deficits are appreciated.  Skin:  Skin is warm, dry and intact. No rash noted. Psychiatric: Mood and affect are normal. Speech and behavior are normal.  ____________________________________________   LABS (all labs ordered are listed, but only abnormal results are displayed)  Labs Reviewed  BASIC METABOLIC PANEL - Abnormal; Notable for the following:    Chloride 99 (*)    All other components within normal limits  URINALYSIS COMPLETEWITH MICROSCOPIC ([facility] ONLY) - Abnormal; Notable for the following:    Color, Urine YELLOW (*)    APPearance CLEAR (*)    Squamous Epithelial / LPF 0-5 (*)    All other components within normal limits  GLUCOSE, CAPILLARY - Abnormal; Notable for the following:    Glucose-Capillary 103 (*)    All other components within normal limits  CBC  BRAIN NATRIURETIC PEPTIDE  TROPONIN I  CBG MONITORING, ED   ____________________________________________  EKG  ED ECG REPORT I, Charlann Wayne, the attending physician, personally viewed and interpreted this ECG.  Date: 01/04/2016 EKG Time: 23:30 Rate: 85 Rhythm: normal sinus rhythm QRS Axis: normal Intervals: normal ST/T Wave abnormalities: normal Conduction Disturbances: none Narrative Interpretation:  unremarkable  ____________________________________________  RADIOLOGY   Dg Chest 2 View  01/05/2016  CLINICAL DATA:  60 year old male with shortness of breath and chest pain EXAM: CHEST  2 VIEW COMPARISON:  Chest radiograph dated 12/28/2015 FINDINGS: The heart size and mediastinal contours are within normal limits. Both lungs are clear. The visualized skeletal structures are unremarkable. IMPRESSION: No active cardiopulmonary disease. Electronically Signed   By: Elgie CollardArash  Radparvar M.D.   On: 01/05/2016 04:28    ____________________________________________   PROCEDURES  Procedure(s) performed:   Procedures   ____________________________________________   INITIAL IMPRESSION / ASSESSMENT AND PLAN / ED COURSE  Pertinent labs & imaging results that were available during my care of the patient were reviewed by me and considered in my  medical decision making (see chart for details).  The patient was alert and feeling better by the time I saw him.  He continued having some wheezing which improved completely after 2 DuoNeb labs.  His labs were reassuring and he had an unremarkable stay in the ED.  I encouraged him to continue using his breathing treatments and regular medications and follow-up with Dr. this coming week.  He has plans to see someone to establish primary care.  I gave my usual and customary return precautions.      ____________________________________________  FINAL CLINICAL IMPRESSION(S) / ED DIAGNOSES  Final diagnoses:  Asthma exacerbation  Essential hypertension     MEDICATIONS GIVEN DURING THIS VISIT:  Medications  acetaminophen (TYLENOL) tablet 650 mg (650 mg Oral Given 01/05/16 0406)  ipratropium-albuterol (DUONEB) 0.5-2.5 (3) MG/3ML nebulizer solution 3 mL (3 mLs Nebulization Given 01/05/16 0457)  ipratropium-albuterol (DUONEB) 0.5-2.5 (3) MG/3ML nebulizer solution 3 mL (3 mLs Nebulization Given 01/05/16 0457)  albuterol (PROVENTIL) (2.5 MG/3ML) 0.083%  nebulizer solution (  [facility])     NEW OUTPATIENT MEDICATIONS STARTED DURING THIS VISIT:  Discharge Medication List as of 01/05/2016  6:54 AM        Note:  This document was prepared using Dragon voice recognition software and may include unintentional dictation errors.   Loleta Roseory Astryd Pearcy, MD 01/05/16 [phone]

## 2016-09-14 ENCOUNTER — Encounter: Payer: Self-pay | Admitting: Emergency Medicine

## 2016-09-14 ENCOUNTER — Emergency Department
Admission: EM | Admit: 2016-09-14 | Discharge: 2016-09-14 | Disposition: A | Discharge status: Home / Self Care | Payer: Self-pay | Attending: Emergency Medicine | Admitting: Emergency Medicine

## 2016-09-14 DIAGNOSIS — Z9114 Patient's other noncompliance with medication regimen: Secondary | ICD-10-CM | POA: Insufficient documentation

## 2016-09-14 DIAGNOSIS — J4521 Mild intermittent asthma with (acute) exacerbation: Secondary | ICD-10-CM | POA: Insufficient documentation

## 2016-09-14 DIAGNOSIS — I10 Essential (primary) hypertension: Secondary | ICD-10-CM | POA: Insufficient documentation

## 2016-09-14 DIAGNOSIS — Z79899 Other long term (current) drug therapy: Secondary | ICD-10-CM | POA: Insufficient documentation

## 2016-09-14 DIAGNOSIS — Z760 Encounter for issue of repeat prescription: Secondary | ICD-10-CM | POA: Insufficient documentation

## 2016-09-14 LAB — URINALYSIS, COMPLETE (UACMP) WITH MICROSCOPIC
Bacteria, UA: NONE SEEN
Bilirubin Urine: NEGATIVE
Glucose, UA: NEGATIVE mg/dL
Ketones, ur: 5 mg/dL — AB
Leukocytes, UA: NEGATIVE
Nitrite: NEGATIVE
PH: 5 (ref 5.0–8.0)
Protein, ur: NEGATIVE mg/dL
SPECIFIC GRAVITY, URINE: 1.011 (ref 1.005–1.030)
WBC, UA: NONE SEEN WBC/hpf (ref 0–5)

## 2016-09-14 LAB — BASIC METABOLIC PANEL
ANION GAP: 8 (ref 5–15)
BUN: 10 mg/dL (ref 6–20)
CHLORIDE: 104 mmol/L (ref 101–111)
CO2: 25 mmol/L (ref 22–32)
Calcium: 8.7 mg/dL — ABNORMAL LOW (ref 8.9–10.3)
Creatinine, Ser: 0.83 mg/dL (ref 0.61–1.24)
GFR calc non Af Amer: >60 mL/min (ref >60)
Glucose, Bld: 107 mg/dL — ABNORMAL HIGH (ref 65–99)
POTASSIUM: 4 mmol/L (ref 3.5–5.1)
SODIUM: 137 mmol/L (ref 135–145)

## 2016-09-14 LAB — CBC
HEMATOCRIT: 45.5 % (ref 40.0–52.0)
HEMOGLOBIN: 16 g/dL (ref 13.0–18.0)
MCH: 32.4 pg (ref 26.0–34.0)
MCHC: 35.2 g/dL (ref 32.0–36.0)
MCV: 91.8 fL (ref 80.0–100.0)
PLATELETS: 169 10*3/uL (ref 150–440)
RBC: 4.96 MIL/uL (ref 4.40–5.90)
RDW: 12.8 % (ref 11.5–14.5)
WBC: 5 10*3/uL (ref 3.8–10.6)

## 2016-09-14 MED ORDER — ACETAMINOPHEN 500 MG PO TABS
1000.0000 mg | ORAL_TABLET | Freq: Once | ORAL | Status: AC
  Administered 2016-09-14: 1000 mg via ORAL

## 2016-09-14 MED ORDER — LISINOPRIL 20 MG PO TABS: 20.0000 mg | ORAL_TABLET | Freq: Every day | ORAL | 1 refills | Status: DC

## 2016-09-14 MED ORDER — ALBUTEROL SULFATE HFA 108 (90 BASE) MCG/ACT IN AERS: 2.0000 | INHALATION_SPRAY | RESPIRATORY_TRACT | 0 refills | Status: DC | PRN

## 2016-09-14 MED ORDER — PREDNISONE 20 MG PO TABS: 40.0000 mg | ORAL_TABLET | Freq: Every day | ORAL | 0 refills | Status: DC

## 2016-09-14 MED ORDER — ACETAMINOPHEN 500 MG PO TABS
ORAL_TABLET | ORAL | Status: AC
  Filled 2016-09-14: qty 2

## 2016-09-14 NOTE — ED Notes (Signed)
Pt reports dizziness and sob.  Sx began at noon today.  Pt reports high blood pressure today. Pt took bp med and now has a h/a.  Pt alert.  Speech clear.  No diff ambulating.  Pt wheezing and used breathing treatment this morning with some relief.  Nonsmoker  No fever.  No chest pain.  Iv in place.

## 2016-09-14 NOTE — ED Notes (Signed)
ED Provider at bedside. 

## 2016-09-14 NOTE — ED Notes (Signed)
Patient was given a specimen cup to collect a sample of urine when the patient has to use the restroom.

## 2016-09-14 NOTE — ED Provider Notes (Signed)
[facility] Emergency Department Provider Note  ____________________________________________  Time seen: Approximately 5:56 PM  I have reviewed the triage vital signs and the nursing notes.   HISTORY  Chief Complaint Dizziness and Shortness of Breath    HPI Joel Harrell is a 61 y.o. male who complains of elevated blood pressure of about 200/115 earlier today at home with his automatic cuff. He went to the fire department and found it to be about 180/120. He had some dizziness so he came to the ED. He notes that he doesn't have a doctor and was running out of his lisinopril so he's been stretching it out taking it and most every other day. Blood pressures have been running higher because of that. Denies vision changes numbness [place] weakness or syncope. No chest pain. Also reports wheezing, has albuterol inhaler. He's been battling a cold for almost a week with productive cough and nasal congestion as well.     Past Medical History:  Diagnosis Date  . Asthma   . Hypertension      There are no active problems to display for this patient.    History reviewed. No pertinent surgical history.   Prior to Admission medications   Medication Sig Start Date End Date Taking? Authorizing Provider  albuterol (PROVENTIL) (2.5 MG/3ML) 0.083% nebulizer solution Take 3 mLs (2.5 mg total) by nebulization every 6 (six) hours as needed for wheezing or shortness of breath. 12/28/15  Yes Jennye MoccasinBrian S Quigley, MD  aspirin 81 MG EC tablet Take 81 mg by mouth daily.     Yes Historical Provider, MD  budesonide-formoterol (SYMBICORT) 160-4.5 MCG/ACT inhaler Inhale 2 puffs into the lungs 2 (two) times daily.     Yes Historical Provider, MD  lisinopril (PRINIVIL,ZESTRIL) 20 MG tablet Take 1 tablet (20 mg total) by mouth daily. 12/28/15 12/27/16 Yes Jennye MoccasinBrian S Quigley, MD  albuterol (PROVENTIL HFA) 108 (90 Base) MCG/ACT inhaler Inhale 2 puffs into the lungs every 4 (four) hours as needed for  wheezing or shortness of breath. 09/14/16   Sharman CheekPhillip Lisia Westbay, MD  albuterol (PROVENTIL HFA;VENTOLIN HFA) 108 (90 Base) MCG/ACT inhaler Inhale 2 puffs into the lungs every 6 (six) hours as needed for wheezing or shortness of breath. 12/28/15   Jennye MoccasinBrian S Quigley, MD  lisinopril (PRINIVIL,ZESTRIL) 20 MG tablet Take 1 tablet (20 mg total) by mouth daily. 09/14/16 09/14/17  Sharman CheekPhillip Corrinna Karapetyan, MD  montelukast (SINGULAIR) 10 MG tablet Take 1 tablet (10 mg total) by mouth at bedtime. 12/28/15 12/27/16  Jennye MoccasinBrian S Quigley, MD  omeprazole (PRILOSEC) 20 MG capsule Take 20 mg by mouth daily.      Historical Provider, MD  predniSONE (DELTASONE) 20 MG tablet Take 2 tablets (40 mg total) by mouth daily. 09/14/16   Sharman CheekPhillip Nirel Babler, MD     Allergies Patient has no known allergies.   Family History  Problem Relation Age of Onset  . Heart attack Father   . Cancer Mother     Social History Social History  Substance Use Topics  . Smoking status: Never Smoker  . Smokeless tobacco: Never Used  . Alcohol use 6.0 oz/week    10 Cans of beer per week    Review of Systems  Constitutional:   No fever or chills.  ENT:   No sore throat. No rhinorrhea. Cardiovascular:   No chest pain. Respiratory:   No dyspnea Positive wheezing and cough. Gastrointestinal:   Negative for abdominal pain, vomiting and diarrhea.  Genitourinary:   Negative for dysuria or difficulty  urinating. Musculoskeletal:   Negative for focal pain or swelling Neurological:   Positive of bilateral frontal headache 10-point ROS otherwise negative.  ____________________________________________   PHYSICAL EXAM:  VITAL SIGNS: ED Triage Vitals [09/14/16 1512]  Enc Vitals Group     BP (!) 160/82     Pulse Rate (!) 110     Resp 20     Temp 98.4 F (36.9 C)     Temp Source Oral     SpO2 97 %     Weight 230 lb (104.3 kg)     Height 6\' 3"  (1.905 m)     Head Circumference      Peak Flow      Pain Score      Pain Loc      Pain Edu?      Excl. in  GC?     Vital signs reviewed, nursing assessments reviewed.   Constitutional:   Alert and oriented. Well appearing and in no distress. Eyes:   No scleral icterus. No conjunctival pallor. PERRL. EOMI.  No nystagmus. ENT   Head:   Normocephalic and atraumatic.   Nose:   No congestion/rhinnorhea. No septal hematoma   Mouth/Throat:   MMM, no pharyngeal erythema. No peritonsillar mass.    Neck:   No stridor. No SubQ emphysema. No meningismus. Hematological/Lymphatic/Immunilogical:   No cervical lymphadenopathy. Cardiovascular:   RRR. Symmetric bilateral radial and DP pulses.  No murmurs.  Respiratory:   Normal respiratory effort without tachypnea nor retractions. Diffuse expiratory wheezing Gastrointestinal:   Soft and nontender. Non distended. There is no CVA tenderness.  No rebound, rigidity, or guarding. Genitourinary:   deferred Musculoskeletal:   Normal range of motion in all extremities. No joint effusions.  No lower extremity tenderness.  No edema. Neurologic:   Normal speech and language.  CN 2-10 normal. Motor grossly intact. Normal gait. Normal cerebellar testing. No gross focal neurologic deficits are appreciated.  Skin:    Skin is warm, dry and intact. No rash noted.  No petechiae, purpura, or bullae.  ____________________________________________    LABS (pertinent positives/negatives) (all labs ordered are listed, but only abnormal results are displayed) Labs Reviewed  BASIC METABOLIC PANEL - Abnormal; Notable for the following:       Result Value   Glucose, Bld 107 (*)    Calcium 8.7 (*)    All other components within normal limits  URINALYSIS, COMPLETE (UACMP) WITH MICROSCOPIC - Abnormal; Notable for the following:    Color, Urine YELLOW (*)    APPearance CLEAR (*)    Hgb urine dipstick SMALL (*)    Ketones, ur 5 (*)    Squamous Epithelial / LPF 0-5 (*)    All other components within normal limits  CBC  CBG MONITORING, ED  CBG MONITORING, ED    ____________________________________________   EKG  Interpreted by me Sinus tachycardia rate 103, normal axis intervals QRS ST segments and T waves  ____________________________________________    RADIOLOGY  No results found.  ____________________________________________   PROCEDURES Procedures  ____________________________________________   INITIAL IMPRESSION / ASSESSMENT AND PLAN / ED COURSE  Pertinent labs & imaging results that were available during my care of the patient were reviewed by me and considered in my medical decision making (see chart for details).  Patient well appearing no acute distress, complains of some vague headache symptoms possibly related to uncontrolled hypertension. Neurologically intact, low suspicion for stroke intracranial hemorrhage glaucoma temporal arteritis or meningitis or encephalitis. I'll re-prescribe his lisinopril. Also prednisone  burst for his asthma exacerbation. No evidence of ACS PE dissection heart failure. Suitable for outpatient follow-up. Patient will call to arrange himself a primary care doctor.         ____________________________________________   FINAL CLINICAL IMPRESSION(S) / ED DIAGNOSES  Final diagnoses:  Mild intermittent asthma with exacerbation  Uncontrolled hypertension  Encounter for medication refill  Noncompliance with medications      New Prescriptions   ALBUTEROL (PROVENTIL HFA) 108 (90 BASE) MCG/ACT INHALER    Inhale 2 puffs into the lungs every 4 (four) hours as needed for wheezing or shortness of breath.   LISINOPRIL (PRINIVIL,ZESTRIL) 20 MG TABLET    Take 1 tablet (20 mg total) by mouth daily.   PREDNISONE (DELTASONE) 20 MG TABLET    Take 2 tablets (40 mg total) by mouth daily.     Portions of this note were generated with dragon dictation software. Dictation errors may occur despite best attempts at proofreading.    Sharman Cheek, MD 09/14/16 [phone]

## 2016-09-14 NOTE — ED Notes (Signed)
20g iv removed from left hand.  D/c inst to pt.

## 2016-09-14 NOTE — ED Triage Notes (Addendum)
Pt reports feeling dizzy this morning after breakfast, as well as sob. He reports his bp was 208/116 at home. Pt states he is still feeling dizzy, but not as bad. He also reports having a breathing treatment at home before he came here, but he still feels some sob. Pt has had cough and cold symptoms for last week, with productive cough. Pt alert & oriented with NAD noted.

## 2018-03-17 ENCOUNTER — Emergency Department: Payer: Self-pay

## 2018-03-17 ENCOUNTER — Other Ambulatory Visit: Payer: Self-pay

## 2018-03-17 ENCOUNTER — Emergency Department
Admission: EM | Admit: 2018-03-17 | Discharge: 2018-03-17 | Disposition: A | Discharge status: Home / Self Care | Payer: Self-pay | Attending: Emergency Medicine | Admitting: Emergency Medicine

## 2018-03-17 ENCOUNTER — Encounter: Payer: Self-pay | Admitting: Emergency Medicine

## 2018-03-17 DIAGNOSIS — Z79899 Other long term (current) drug therapy: Secondary | ICD-10-CM | POA: Insufficient documentation

## 2018-03-17 DIAGNOSIS — Z7982 Long term (current) use of aspirin: Secondary | ICD-10-CM | POA: Insufficient documentation

## 2018-03-17 DIAGNOSIS — R0602 Shortness of breath: Secondary | ICD-10-CM | POA: Insufficient documentation

## 2018-03-17 DIAGNOSIS — I10 Essential (primary) hypertension: Secondary | ICD-10-CM | POA: Insufficient documentation

## 2018-03-17 DIAGNOSIS — J45909 Unspecified asthma, uncomplicated: Secondary | ICD-10-CM | POA: Insufficient documentation

## 2018-03-17 LAB — BASIC METABOLIC PANEL
Anion gap: 8 (ref 5–15)
BUN: 9 mg/dL (ref 8–23)
CHLORIDE: 101 mmol/L (ref 98–111)
CO2: 26 mmol/L (ref 22–32)
Calcium: 8.6 mg/dL — ABNORMAL LOW (ref 8.9–10.3)
Creatinine, Ser: 0.96 mg/dL (ref 0.61–1.24)
GFR calc non Af Amer: >60 mL/min (ref >60)
Glucose, Bld: 125 mg/dL — ABNORMAL HIGH (ref 70–99)
POTASSIUM: 3 mmol/L — AB (ref 3.5–5.1)
SODIUM: 135 mmol/L (ref 135–145)

## 2018-03-17 LAB — LACTIC ACID, PLASMA: Lactic Acid, Venous: 1.4 mmol/L (ref 0.5–1.9)

## 2018-03-17 LAB — CBC
HEMATOCRIT: 46.9 % (ref 40.0–52.0)
Hemoglobin: 16.5 g/dL (ref 13.0–18.0)
MCH: 32.3 pg (ref 26.0–34.0)
MCHC: 35.1 g/dL (ref 32.0–36.0)
MCV: 92 fL (ref 80.0–100.0)
Platelets: 159 10*3/uL (ref 150–440)
RBC: 5.1 MIL/uL (ref 4.40–5.90)
RDW: 13.6 % (ref 11.5–14.5)
WBC: 4 10*3/uL (ref 3.8–10.6)

## 2018-03-17 LAB — TROPONIN I: Troponin I: <0.03 ng/mL (ref <0.03)

## 2018-03-17 MED ORDER — ALBUTEROL SULFATE HFA 108 (90 BASE) MCG/ACT IN AERS: 2.0000 | INHALATION_SPRAY | Freq: Four times a day (QID) | RESPIRATORY_TRACT | 0 refills | Status: DC | PRN

## 2018-03-17 MED ORDER — PREDNISONE 20 MG PO TABS: 40.0000 mg | ORAL_TABLET | Freq: Every day | ORAL | 0 refills | Status: DC

## 2018-03-17 MED ORDER — ALBUTEROL SULFATE (2.5 MG/3ML) 0.083% IN NEBU: 2.5000 mg | INHALATION_SOLUTION | Freq: Four times a day (QID) | RESPIRATORY_TRACT | 12 refills | Status: DC | PRN

## 2018-03-17 MED ORDER — AMLODIPINE BESYLATE 5 MG PO TABS: 5.0000 mg | ORAL_TABLET | Freq: Every day | ORAL | 1 refills | Status: DC

## 2018-03-17 MED ORDER — ALBUTEROL SULFATE (2.5 MG/3ML) 0.083% IN NEBU
5.0000 mg | INHALATION_SOLUTION | Freq: Once | RESPIRATORY_TRACT | Status: AC
  Administered 2018-03-17: 5 mg via RESPIRATORY_TRACT
  Filled 2018-03-17: qty 6

## 2018-03-17 MED ORDER — IPRATROPIUM-ALBUTEROL 0.5-2.5 (3) MG/3ML IN SOLN
3.0000 mL | Freq: Once | RESPIRATORY_TRACT | Status: AC
  Administered 2018-03-17: 3 mL via RESPIRATORY_TRACT
  Filled 2018-03-17: qty 3

## 2018-03-17 MED ORDER — PREDNISONE 20 MG PO TABS
60.0000 mg | ORAL_TABLET | Freq: Once | ORAL | Status: AC
  Administered 2018-03-17: 60 mg via ORAL
  Filled 2018-03-17: qty 3

## 2018-03-17 NOTE — ED Triage Notes (Signed)
Pt in via POV with complaints of chest pain w/ radiation into left arm and shortness of breath x 2 days.  Congested cough and dyspnea noted at rest.

## 2018-03-17 NOTE — Discharge Instructions (Addendum)
Please seek medical attention for any high fevers, chest pain, shortness of breath, change in behavior, persistent vomiting, bloody stool or any other new or concerning symptoms.  

## 2018-03-17 NOTE — ED Triage Notes (Signed)
FIRST NURSE NOTE-pulled for EKG, c/o chest pain. Ambulatory.

## 2018-03-17 NOTE — ED Provider Notes (Signed)
[facility] Emergency Department Provider Note  ____________________________________________   I have reviewed the triage vital signs and the nursing notes.   HISTORY  Chief Complaint Chest Pain and Shortness of Breath   History limited by: Not Limited   HPI Joel Harrell is a 62 y.o. male who presents to the emergency department today with concerns for shortness of breath as well as some chest discomfort.  The patient states that the shortness of breath started a couple of days ago.  He states that he has been wheezing.  He has had a cough which is been nonbloody.  He has some chest discomfort with this.  He states that it addition for the past few weeks he has been having intermittent left arm numbness.  He has not noticed any pattern to this.  He denies any fevers.  Per medical record review patient has a history of asthma, htn.  Past Medical History:  Diagnosis Date  . Asthma   . Hypertension     There are no active problems to display for this patient.   History reviewed. No pertinent surgical history.  Prior to Admission medications   Medication Sig Start Date End Date Taking? Authorizing Provider  albuterol (PROVENTIL HFA) 108 (90 Base) MCG/ACT inhaler Inhale 2 puffs into the lungs every 4 (four) hours as needed for wheezing or shortness of breath. 09/14/16   Sharman Cheek, MD  albuterol (PROVENTIL HFA;VENTOLIN HFA) 108 (90 Base) MCG/ACT inhaler Inhale 2 puffs into the lungs every 6 (six) hours as needed for wheezing or shortness of breath. 12/28/15   Jennye Moccasin, MD  albuterol (PROVENTIL) (2.5 MG/3ML) 0.083% nebulizer solution Take 3 mLs (2.5 mg total) by nebulization every 6 (six) hours as needed for wheezing or shortness of breath. 12/28/15   Jennye Moccasin, MD  aspirin 81 MG EC tablet Take 81 mg by mouth daily.      [provider]  budesonide-formoterol (SYMBICORT) 160-4.5 MCG/ACT inhaler Inhale 2 puffs into the lungs 2 (two)  times daily.      [provider]  lisinopril (PRINIVIL,ZESTRIL) 20 MG tablet Take 1 tablet (20 mg total) by mouth daily. 12/28/15 12/27/16  Jennye Moccasin, MD  lisinopril (PRINIVIL,ZESTRIL) 20 MG tablet Take 1 tablet (20 mg total) by mouth daily. 09/14/16 09/14/17  Sharman Cheek, MD  montelukast (SINGULAIR) 10 MG tablet Take 1 tablet (10 mg total) by mouth at bedtime. 12/28/15 12/27/16  Jennye Moccasin, MD  omeprazole (PRILOSEC) 20 MG capsule Take 20 mg by mouth daily.      [provider]  predniSONE (DELTASONE) 20 MG tablet Take 2 tablets (40 mg total) by mouth daily. 09/14/16   Sharman Cheek, MD  albuterol (PROVENTIL,VENTOLIN) 90 MCG/ACT inhaler Inhale 2 puffs into the lungs every 6 (six) hours as needed.    12/28/15  [provider]    Allergies Patient has no known allergies.  Family History  Problem Relation Age of Onset  . Heart attack Father   . Cancer Mother     Social History Social History   Tobacco Use  . Smoking status: Never Smoker  . Smokeless tobacco: Never Used  Substance Use Topics  . Alcohol use: Yes    Alcohol/week: 10.0 standard drinks    Types: 10 Cans of beer per week  . Drug use: No    Review of Systems Constitutional: No fever/chills Eyes: No visual changes. ENT: No sore throat. Cardiovascular: Positive for chest pain. Respiratory: Positive for shortness of  breath. Gastrointestinal: No abdominal pain.  No nausea, no vomiting.  No diarrhea.   Genitourinary: Negative for dysuria. Musculoskeletal: Negative for back pain. Skin: Negative for rash. Neurological: Intermittent left arm numbness  ____________________________________________   PHYSICAL EXAM:  VITAL SIGNS: ED Triage Vitals  Enc Vitals Group     BP 03/17/18 1309 (!) 155/87     Pulse Rate 03/17/18 1309 (!) 105     Resp 03/17/18 1309 20     Temp 03/17/18 1309 98.1 F (36.7 C)     Temp Source 03/17/18 1309 Oral     SpO2 03/17/18 1309 94 %     Weight  03/17/18 1310 230 lb (104.3 kg)     Height 03/17/18 1310 6\' 2"  (1.88 m)     Head Circumference --      Peak Flow --      Pain Score 03/17/18 1310 6   Constitutional: Alert and oriented.  Eyes: Conjunctivae are normal.  ENT      Head: Normocephalic and atraumatic.      Nose: No congestion/rhinnorhea.      Mouth/Throat: Mucous membranes are moist.      Neck: No stridor. Hematological/Lymphatic/Immunilogical: No cervical lymphadenopathy. Cardiovascular: Normal rate, regular rhythm.  No murmurs, rubs, or gallops.  Respiratory: Slightly increased respiratory effort with diffuse expiratory wheezing. Gastrointestinal: Soft and non tender. No rebound. No guarding.  Genitourinary: Deferred Musculoskeletal: Normal range of motion in all extremities. No lower extremity edema. Neurologic:  Normal speech and language. No gross focal neurologic deficits are appreciated.  Skin:  Skin is warm, dry and intact. No rash noted. Psychiatric: Mood and affect are normal. Speech and behavior are normal. Patient exhibits appropriate insight and judgment.  ____________________________________________    LABS (pertinent positives/negatives)  Lactic 1.4 Trop <0.03 CBC wnl BMP na 135, k 3.0, glu 125, cr 0.96  ____________________________________________   EKG  I, Phineas Semen, attending physician, personally viewed and interpreted this EKG  EKG Time: 1304 Rate: 105 Rhythm: sinus tachycardia Axis: normal Intervals: qtc 459 QRS: narrow ST changes: no st elevation Impression: abnormal ekg  ____________________________________________    RADIOLOGY  CXR No acute process  ____________________________________________   PROCEDURES  Procedures  ____________________________________________   INITIAL IMPRESSION / ASSESSMENT AND PLAN / ED COURSE  Pertinent labs & imaging results that were available during my care of the patient were reviewed by me and considered in my medical decision  making (see chart for details).   Patient presented to the emergency department today because of concerns for shortness of breath and some chest discomfort.  Patient did have diffuse expiratory wheezing on initial exam.  Patient was given steroids breathing treatments and did feel improved.  Patient's work-up without any concerning findings for pneumonia.  This point I doubt ACS.  Will discharge with prescription for steroids and inhaler.  _________________________   FINAL CLINICAL IMPRESSION(S) / ED DIAGNOSES  Final diagnoses:  Shortness of breath     Note: This dictation was prepared with Dragon dictation. Any transcriptional errors that result from this process are unintentional     Phineas Semen, MD 03/17/18 1645

## 2018-12-05 ENCOUNTER — Encounter: Payer: Self-pay | Admitting: *Deleted

## 2018-12-05 ENCOUNTER — Other Ambulatory Visit: Payer: Self-pay

## 2018-12-05 ENCOUNTER — Emergency Department: Payer: Medicare Other

## 2018-12-05 ENCOUNTER — Emergency Department
Admission: EM | Admit: 2018-12-05 | Discharge: 2018-12-05 | Disposition: A | Discharge status: Home / Self Care | Payer: Medicare Other | Attending: Emergency Medicine | Admitting: Emergency Medicine

## 2018-12-05 DIAGNOSIS — R079 Chest pain, unspecified: Secondary | ICD-10-CM | POA: Diagnosis present

## 2018-12-05 DIAGNOSIS — Z79899 Other long term (current) drug therapy: Secondary | ICD-10-CM | POA: Insufficient documentation

## 2018-12-05 DIAGNOSIS — J45909 Unspecified asthma, uncomplicated: Secondary | ICD-10-CM | POA: Insufficient documentation

## 2018-12-05 DIAGNOSIS — I110 Hypertensive heart disease with heart failure: Secondary | ICD-10-CM | POA: Diagnosis not present

## 2018-12-05 DIAGNOSIS — I509 Heart failure, unspecified: Secondary | ICD-10-CM | POA: Diagnosis not present

## 2018-12-05 DIAGNOSIS — R0789 Other chest pain: Secondary | ICD-10-CM | POA: Insufficient documentation

## 2018-12-05 DIAGNOSIS — Z7982 Long term (current) use of aspirin: Secondary | ICD-10-CM | POA: Diagnosis not present

## 2018-12-05 DIAGNOSIS — Z760 Encounter for issue of repeat prescription: Secondary | ICD-10-CM

## 2018-12-05 HISTORY — DX: Heart failure, unspecified: I50.9

## 2018-12-05 LAB — CBC
HCT: 44.6 % (ref 39.0–52.0)
Hemoglobin: 15.6 g/dL (ref 13.0–17.0)
MCH: 32.1 pg (ref 26.0–34.0)
MCHC: 35 g/dL (ref 30.0–36.0)
MCV: 91.8 fL (ref 80.0–100.0)
Platelets: 164 10*3/uL (ref 150–400)
RBC: 4.86 MIL/uL (ref 4.22–5.81)
RDW: 13.1 % (ref 11.5–15.5)
WBC: 6.2 10*3/uL (ref 4.0–10.5)
nRBC: 0 % (ref 0.0–0.2)

## 2018-12-05 LAB — BASIC METABOLIC PANEL
Anion gap: 10 (ref 5–15)
BUN: 11 mg/dL (ref 8–23)
CO2: 23 mmol/L (ref 22–32)
Calcium: 8.2 mg/dL — ABNORMAL LOW (ref 8.9–10.3)
Chloride: 107 mmol/L (ref 98–111)
Creatinine, Ser: 0.89 mg/dL (ref 0.61–1.24)
GFR calc Af Amer: >60 mL/min (ref >60)
GFR calc non Af Amer: >60 mL/min (ref >60)
Glucose, Bld: 95 mg/dL (ref 70–99)
Potassium: 3 mmol/L — ABNORMAL LOW (ref 3.5–5.1)
Sodium: 140 mmol/L (ref 135–145)

## 2018-12-05 LAB — TROPONIN I: Troponin I: <0.03 ng/mL (ref <0.03)

## 2018-12-05 MED ORDER — IPRATROPIUM-ALBUTEROL 0.5-2.5 (3) MG/3ML IN SOLN
3.0000 mL | Freq: Once | RESPIRATORY_TRACT | Status: AC
  Administered 2018-12-05: 3 mL via RESPIRATORY_TRACT

## 2018-12-05 MED ORDER — BUDESONIDE-FORMOTEROL FUMARATE 160-4.5 MCG/ACT IN AERO: 2.0000 | INHALATION_SPRAY | Freq: Two times a day (BID) | RESPIRATORY_TRACT | 1 refills | Status: DC

## 2018-12-05 MED ORDER — AMLODIPINE BESYLATE 5 MG PO TABS: 5.0000 mg | ORAL_TABLET | Freq: Every day | ORAL | 1 refills | Status: DC

## 2018-12-05 MED ORDER — PREDNISONE 20 MG PO TABS
60.0000 mg | ORAL_TABLET | Freq: Once | ORAL | Status: AC
  Administered 2018-12-05: 02:00:00 60 mg via ORAL
  Filled 2018-12-05: qty 3

## 2018-12-05 MED ORDER — PREDNISONE 50 MG PO TABS: 50.0000 mg | ORAL_TABLET | Freq: Every day | ORAL | 0 refills | Status: DC

## 2018-12-05 MED ORDER — IPRATROPIUM-ALBUTEROL 0.5-2.5 (3) MG/3ML IN SOLN
RESPIRATORY_TRACT | Status: AC
  Filled 2018-12-05: qty 3

## 2018-12-05 MED ORDER — ALBUTEROL SULFATE HFA 108 (90 BASE) MCG/ACT IN AERS: 2.0000 | INHALATION_SPRAY | Freq: Four times a day (QID) | RESPIRATORY_TRACT | 1 refills | Status: DC | PRN

## 2018-12-05 MED ORDER — LISINOPRIL 20 MG PO TABS: 20.0000 mg | ORAL_TABLET | Freq: Every day | ORAL | 1 refills | Status: DC

## 2018-12-05 NOTE — ED Provider Notes (Signed)
[facility] Emergency Department Provider Note   ____________________________________________    I have reviewed the triage vital signs and the nursing notes.   HISTORY  Chief Complaint Chest Pain     HPI Joel Harrell is a 63 y.o. male who presents with complaints of chest tightness and mild shortness of breath especially when he is outside in the heat.  He thinks this may have played with his asthma as he has been out of his medications.  He denies fevers or chills.  No significant cough.  No myalgias.  No exposure to COVID-19 positive patients.  Has not take anything for this.  Has tried to schedule a follow-up visit for refills with PCP.  Currently feels well and reports only minimal chest tightness  Past Medical History:  Diagnosis Date  . Asthma   . CHF (congestive heart failure) (HCC)   . Hypertension     There are no active problems to display for this patient.   History reviewed. No pertinent surgical history.  Prior to Admission medications   Medication Sig Start Date End Date Taking? Authorizing Provider  albuterol (VENTOLIN HFA) 108 (90 Base) MCG/ACT inhaler Inhale 2 puffs into the lungs every 6 (six) hours as needed for wheezing or shortness of breath. 12/05/18   Jene EveryKinner, Dealva Lafoy, MD  amLODipine (NORVASC) 5 MG tablet Take 1 tablet (5 mg total) by mouth daily. 12/05/18 12/05/19  Jene EveryKinner, Joei Frangos, MD  aspirin 81 MG EC tablet Take 81 mg by mouth daily.      [provider]  budesonide-formoterol (SYMBICORT) 160-4.5 MCG/ACT inhaler Inhale 2 puffs into the lungs 2 (two) times daily. 12/05/18   Jene EveryKinner, Jakerria Kingbird, MD  lisinopril (PRINIVIL,ZESTRIL) 20 MG tablet Take 1 tablet (20 mg total) by mouth daily. 09/14/16 09/14/17  Sharman CheekStafford, Phillip, MD  lisinopril (ZESTRIL) 20 MG tablet Take 1 tablet (20 mg total) by mouth daily. 12/05/18 12/05/19  Jene EveryKinner, Ascencion Stegner, MD  montelukast (SINGULAIR) 10 MG tablet Take 1 tablet (10 mg total) by mouth at bedtime. 12/28/15  12/27/16  Jennye MoccasinQuigley, Brian S, MD  omeprazole (PRILOSEC) 20 MG capsule Take 20 mg by mouth daily.      [provider]  predniSONE (DELTASONE) 50 MG tablet Take 1 tablet (50 mg total) by mouth daily with breakfast. 12/05/18   Jene EveryKinner, Ruthe Roemer, MD     Allergies Patient has no known allergies.  Family History  Problem Relation Age of Onset  . Heart attack Father   . Cancer Mother     Social History Social History   Tobacco Use  . Smoking status: Never Smoker  . Smokeless tobacco: Never Used  Substance Use Topics  . Alcohol use: Yes    Alcohol/week: 10.0 standard drinks    Types: 10 Cans of beer per week  . Drug use: No    Review of Systems  Constitutional: No fever/chills Eyes: No visual changes.  ENT: No sore throat. Cardiovascular: As above Respiratory: As above Gastrointestinal: No abdominal pain.  No nausea, no vomiting.   Genitourinary: Negative for dysuria. Musculoskeletal: Negative for back pain. Skin: Negative for rash. Neurological: Negative for headaches or weakness   ____________________________________________   PHYSICAL EXAM:  VITAL SIGNS: ED Triage Vitals  Enc Vitals Group     BP 12/05/18 0100 (!) 134/95     Pulse Rate 12/05/18 0100 72     Resp 12/05/18 0100 19     Temp 12/05/18 0100 98.2 F (36.8 C)     Temp src --  SpO2 12/05/18 0100 97 %     Weight 12/05/18 0024 102.1 kg (225 lb)     Height 12/05/18 0024 1.905 m (6\' 3" )     Head Circumference --      Peak Flow --      Pain Score 12/05/18 0024 6     Pain Loc --      Pain Edu? --      Excl. in [place]? --     Constitutional: Alert and oriented. No acute distress. Pleasant and interactive Eyes: Conjunctivae are normal.  Head: Atraumatic. Nose: No congestion/rhinnorhea.  Cardiovascular: Normal rate, regular rhythm. Grossly normal heart sounds.  Good peripheral circulation. Respiratory: Normal respiratory effort.  No retractions.  Scattered wheezes Gastrointestinal: Soft and  nontender. No distention.  No CVA tenderness.  Musculoskeletal: No lower extremity tenderness nor edema.  Warm and well perfused Neurologic:  Normal speech and language. No gross focal neurologic deficits are appreciated.  Skin:  Skin is warm, dry and intact. No rash noted. Psychiatric: Mood and affect are normal. Speech and behavior are normal.  ____________________________________________   LABS (all labs ordered are listed, but only abnormal results are displayed)  Labs Reviewed  BASIC METABOLIC PANEL - Abnormal; Notable for the following components:      Result Value   Potassium 3.0 (*)    Calcium 8.2 (*)    All other components within normal limits  CBC  TROPONIN I   ____________________________________________  EKG  ED ECG REPORT I, Jene Every, the attending physician, personally viewed and interpreted this ECG.  Date: 12/05/2018  Rhythm: normal sinus rhythm QRS Axis: normal Intervals: normal ST/T Wave abnormalities: normal Narrative Interpretation: no evidence of acute ischemia  ____________________________________________  RADIOLOGY  Chest x-ray viewed by me, unremarkable ____________________________________________   PROCEDURES  Procedure(s) performed: No  Procedures   Critical Care performed: No ____________________________________________   INITIAL IMPRESSION / ASSESSMENT AND PLAN / ED COURSE  Pertinent labs & imaging results that were available during my care of the patient were reviewed by me and considered in my medical decision making (see chart for details).  Patient presents with chest tightness, mild shortness of breath with exertion.  Out of his asthma meds.  He does have scattered wheezes this is likely the cause of his symptoms.  Will treat with p.o. prednisone, DuoNeb here in the emergency department.    Feel much better after DuoNeb.  Lab work is quite reassuring, troponin is normal.  EKG is reassuring.  Chest x-ray is clear   We will refill his asthma medications as well as his blood pressure medications.   Verne Chaussee was evaluated in Emergency Department on 12/05/2018 for the symptoms described in the history of present illness. He was evaluated in the context of the global COVID-19 pandemic, which necessitated consideration that the patient might be at risk for infection with the SARS-CoV-2 virus that causes COVID-19. Institutional protocols and algorithms that pertain to the evaluation of patients at risk for COVID-19 are in a state of rapid change based on information released by regulatory bodies including the CDC and federal and state organizations. These policies and algorithms were followed during the patient's care in the ED.     ____________________________________________   FINAL CLINICAL IMPRESSION(S) / ED DIAGNOSES  Final diagnoses:  Atypical chest pain  Medication refill        Note:  This document was prepared using Dragon voice recognition software and may include unintentional dictation errors.   Jene Every, MD 12/05/18 [phone]

## 2018-12-05 NOTE — ED Triage Notes (Signed)
Patient reports upper chest pain all day.  Reports shortness of breath and cough.  Reports pain from right shoulder across to left chest.

## 2019-01-19 ENCOUNTER — Encounter: Payer: Self-pay | Admitting: Emergency Medicine

## 2019-01-19 ENCOUNTER — Other Ambulatory Visit: Payer: Self-pay

## 2019-01-19 ENCOUNTER — Emergency Department: Payer: Medicare Other

## 2019-01-19 ENCOUNTER — Emergency Department
Admission: EM | Admit: 2019-01-19 | Discharge: 2019-01-20 | Disposition: A | Discharge status: Home / Self Care | Payer: Medicare Other | Attending: Emergency Medicine | Admitting: Emergency Medicine

## 2019-01-19 DIAGNOSIS — I110 Hypertensive heart disease with heart failure: Secondary | ICD-10-CM | POA: Insufficient documentation

## 2019-01-19 DIAGNOSIS — I509 Heart failure, unspecified: Secondary | ICD-10-CM | POA: Diagnosis not present

## 2019-01-19 DIAGNOSIS — Z7982 Long term (current) use of aspirin: Secondary | ICD-10-CM | POA: Diagnosis not present

## 2019-01-19 DIAGNOSIS — J45909 Unspecified asthma, uncomplicated: Secondary | ICD-10-CM | POA: Insufficient documentation

## 2019-01-19 DIAGNOSIS — Z79899 Other long term (current) drug therapy: Secondary | ICD-10-CM | POA: Insufficient documentation

## 2019-01-19 DIAGNOSIS — R0789 Other chest pain: Secondary | ICD-10-CM | POA: Diagnosis present

## 2019-01-19 DIAGNOSIS — R0602 Shortness of breath: Secondary | ICD-10-CM

## 2019-01-19 DIAGNOSIS — R079 Chest pain, unspecified: Secondary | ICD-10-CM

## 2019-01-19 LAB — BASIC METABOLIC PANEL
Anion gap: 9 (ref 5–15)
BUN: 11 mg/dL (ref 8–23)
CO2: 25 mmol/L (ref 22–32)
Calcium: 8.7 mg/dL — ABNORMAL LOW (ref 8.9–10.3)
Chloride: 104 mmol/L (ref 98–111)
Creatinine, Ser: 0.8 mg/dL (ref 0.61–1.24)
GFR calc Af Amer: >60 mL/min (ref >60)
GFR calc non Af Amer: >60 mL/min (ref >60)
Glucose, Bld: 103 mg/dL — ABNORMAL HIGH (ref 70–99)
Potassium: 3.5 mmol/L (ref 3.5–5.1)
Sodium: 138 mmol/L (ref 135–145)

## 2019-01-19 LAB — CBC
HCT: 46.9 % (ref 39.0–52.0)
Hemoglobin: 16.4 g/dL (ref 13.0–17.0)
MCH: 32.5 pg (ref 26.0–34.0)
MCHC: 35 g/dL (ref 30.0–36.0)
MCV: 92.9 fL (ref 80.0–100.0)
Platelets: 203 10*3/uL (ref 150–400)
RBC: 5.05 MIL/uL (ref 4.22–5.81)
RDW: 12.6 % (ref 11.5–15.5)
WBC: 5.2 10*3/uL (ref 4.0–10.5)
nRBC: 0 % (ref 0.0–0.2)

## 2019-01-19 LAB — TROPONIN I (HIGH SENSITIVITY): Troponin I (High Sensitivity): 7 ng/L (ref <18)

## 2019-01-19 NOTE — ED Triage Notes (Signed)
Patient reports dizziness that started this morning. Reports intermittent left-sided sharp chest pain with SOB that started this afternoon. Patient denies cough or recent illness.

## 2019-01-20 LAB — TROPONIN I (HIGH SENSITIVITY): Troponin I (High Sensitivity): 7 ng/L (ref <18)

## 2019-01-20 MED ORDER — ASPIRIN 81 MG PO CHEW
243.0000 mg | CHEWABLE_TABLET | Freq: Once | ORAL | Status: AC
  Administered 2019-01-20: 243 mg via ORAL
  Filled 2019-01-20: qty 3

## 2019-01-20 NOTE — ED Provider Notes (Signed)
[facility] Emergency Department Provider Note  ____________________________________________   First MD Initiated Contact with Patient 01/20/19 0026     (approximate)  I have reviewed the triage vital signs and the nursing notes.   HISTORY  Chief Complaint Dizziness, Chest Pain, and Shortness of Breath    HPI Joel Harrell Allensworth is a [age] y.o. male    HPI: A 63 year old patient with a history of hypertension presents for evaluation of chest pain. Initial onset of pain was more than 6 hours ago. The patient's chest pain is well-localized, is described as heaviness/pressure/tightness and is not worse with exertion. The patient's chest pain is middle- or left-sided, is not sharp and does not radiate to the arms/jaw/neck. The patient does not complain of nausea and denies diaphoresis. The patient has no history of stroke, has no history of peripheral artery disease, has not smoked in the past 90 days, denies any history of treated diabetes, has no relevant family history of coronary artery disease (first degree relative at less than age 65), has no history of hypercholesterolemia and does not have an elevated BMI (>=30).    The patient has no history of blood clots in the legs of the lungs.  He has not been diagnosed with any specific heart problem except for hypertension.  He does not have a cardiologist or primary care doctor at this time.  He says the pain is mild currently although it was severe earlier.  He has a history of asthma and he has an inhaler at home that helps when he feels short of breath.  He has not had any contact recently with COVID-19 patients and denies sore throat, cough, and shortness of breath except occasionally is associated with the chest pain.    Past Medical History:  Diagnosis Date  . Asthma   . CHF (congestive heart failure) (HCC)   . Hypertension     There are no active problems to display for this patient.   History reviewed. No  pertinent surgical history.  Prior to Admission medications   Medication Sig Start Date End Date Taking? Authorizing Provider  albuterol (VENTOLIN HFA) 108 (90 Base) MCG/ACT inhaler Inhale 2 puffs into the lungs every 6 (six) hours as needed for wheezing or shortness of breath. 12/05/18   Jene EveryKinner, Robert, MD  amLODipine (NORVASC) 5 MG tablet Take 1 tablet (5 mg total) by mouth daily. 12/05/18 12/05/19  Jene EveryKinner, Robert, MD  aspirin 81 MG EC tablet Take 81 mg by mouth daily.      [provider]  budesonide-formoterol (SYMBICORT) 160-4.5 MCG/ACT inhaler Inhale 2 puffs into the lungs 2 (two) times daily. 12/05/18   Jene EveryKinner, Robert, MD  lisinopril (PRINIVIL,ZESTRIL) 20 MG tablet Take 1 tablet (20 mg total) by mouth daily. 09/14/16 09/14/17  Sharman CheekStafford, Phillip, MD  lisinopril (ZESTRIL) 20 MG tablet Take 1 tablet (20 mg total) by mouth daily. 12/05/18 12/05/19  Jene EveryKinner, Robert, MD  montelukast (SINGULAIR) 10 MG tablet Take 1 tablet (10 mg total) by mouth at bedtime. 12/28/15 12/27/16  Jennye MoccasinQuigley, Brian S, MD  omeprazole (PRILOSEC) 20 MG capsule Take 20 mg by mouth daily.      [provider]  predniSONE (DELTASONE) 50 MG tablet Take 1 tablet (50 mg total) by mouth daily with breakfast. 12/05/18   Jene EveryKinner, Robert, MD    Allergies Patient has no known allergies.  Family History  Problem Relation Age of Onset  . Heart attack Father   . Cancer Mother     Social  History Social History   Tobacco Use  . Smoking status: Never Smoker  . Smokeless tobacco: Never Used  Substance Use Topics  . Alcohol use: Yes    Alcohol/week: 10.0 standard drinks    Types: 10 Cans of beer per week  . Drug use: No    Review of Systems Constitutional: No fever/chills Eyes: No visual changes. ENT: No sore throat. Cardiovascular: +chest pain. Respiratory: +shortness of breath. Gastrointestinal: No abdominal pain.  No nausea, no vomiting.  No diarrhea.  No constipation. Genitourinary: Negative for dysuria.  Musculoskeletal: Negative for neck pain.  Negative for back pain. Integumentary: Negative for rash. Neurological: Negative for headaches, focal weakness or numbness.   ____________________________________________   PHYSICAL EXAM:  VITAL SIGNS: ED Triage Vitals  Enc Vitals Group     BP 01/19/19 1836 135/89     Pulse Rate 01/19/19 1836 96     Resp 01/19/19 1836 16     Temp 01/19/19 1836 98.5 F (36.9 C)     Temp Source 01/19/19 1836 Oral     SpO2 01/19/19 1836 98 %     Weight 01/19/19 1837 99.8 kg (220 lb)     Height 01/19/19 1837 1.88 m (6\' 2" )     Head Circumference --      Peak Flow --      Pain Score 01/19/19 1837 6     Pain Loc --      Pain Edu? --      Excl. in [place]? --     Constitutional: Alert and oriented. Well appearing and in no acute distress. Eyes: Conjunctivae are normal.  Head: Atraumatic. Nose: No congestion/rhinnorhea. Mouth/Throat: Mucous membranes are moist. Neck: No stridor.  No meningeal signs.   Cardiovascular: Normal rate, regular rhythm. Good peripheral circulation. Grossly normal heart sounds. Respiratory: Normal respiratory effort.  No retractions. No audible wheezing.  Good air movement throughout lung fields. Gastrointestinal: Soft and nontender. No distention.  Musculoskeletal: There is a slight reproducible nature to the left-sided chest pain upon palpation of the ribs just below the left nipple, but he said that it does not feel exactly like what he was feeling earlier.  No lower extremity tenderness nor edema. No gross deformities of extremities. Neurologic:  Normal speech and language. No gross focal neurologic deficits are appreciated.  Skin:  Skin is warm, dry and intact. No rash noted. Psychiatric: Mood and affect are normal. Speech and behavior are normal.  ____________________________________________   LABS (all labs ordered are listed, but only abnormal results are displayed)  Labs Reviewed  BASIC METABOLIC PANEL - Abnormal;  Notable for the following components:      Result Value   Glucose, Bld 103 (*)    Calcium 8.7 (*)    All other components within normal limits  CBC  TROPONIN I (HIGH SENSITIVITY)  TROPONIN I (HIGH SENSITIVITY)   ____________________________________________  EKG  ED ECG REPORT I, Hinda Kehr, the attending physician, personally viewed and interpreted this ECG.  Date: 01/19/2019 EKG Time: 18:28 Rate: 91 Rhythm: normal sinus rhythm QRS Axis: normal Intervals: normal ST/T Wave abnormalities: normal Narrative Interpretation: no evidence of acute ischemia  ____________________________________________  RADIOLOGY I, Hinda Kehr, personally viewed and evaluated these images (plain radiographs) as part of my medical decision making, as well as reviewing the written report by the radiologist.  ED MD interpretation:  No acute abnormalities  Official radiology report(s): Dg Chest Port 1 View  Result Date: 01/20/2019 CLINICAL DATA:  Dizziness and left-sided sharp chest pain.  EXAM: PORTABLE CHEST 1 VIEW COMPARISON:  Dec 05, 2018 FINDINGS: The heart size and mediastinal contours are within normal limits. Both lungs are clear. The visualized skeletal structures are unremarkable. IMPRESSION: No active disease. Electronically Signed   By: Katherine Mantlehristopher  Green M.D.   On: 01/20/2019 00:25    ____________________________________________   PROCEDURES   Procedure(s) performed (including Critical Care):  Procedures   ____________________________________________   INITIAL IMPRESSION / MDM / ASSESSMENT AND PLAN / ED COURSE  As part of my medical decision making, I reviewed the following data within the electronic MEDICAL RECORD NUMBER Nursing notes reviewed and incorporated, Labs reviewed , EKG interpreted , Old chart reviewed, Radiograph reviewed  and Notes from prior ED visits   HEAR Score: 4  Differential diagnosis includes, but is not limited to, ACS, musculoskeletal pain, PE,  pneumonia, pneumothorax, aortic dissection.  The patient is well-appearing and in no distress.  He has been in the emergency department for more than 7 hours and has had 2 unchanged high-sensitivity troponins with the results being 7 each time which is reassuring.  No ischemic changes on EKG.  Lung sounds are clear, chest x-ray is clear, lab work is unremarkable.  We talked about risk factors for chest pain and the importance of follow-up.  He is comfortable going home and does not want to stay in the hospital and given his relatively low risk and reassuring results including the 2- high-sensitivity troponins I think that is okay, but I strongly encouraged him to call the office of Dr. Juliann Paresallwood in the morning and set up the next available follow-up appointment to see whether additional outpatient testing would be appropriate.  I also gave him strict return precautions.  He takes a daily 81 mg aspirin I encouraged him to continue taking it and I gave him an extra 381 mg aspirin tonight to make a total of a full dose for the day.  He understands and agrees with the plan.      ____________________________________________  FINAL CLINICAL IMPRESSION(S) / ED DIAGNOSES  Final diagnoses:  Chest pain, unspecified type     MEDICATIONS GIVEN DURING THIS VISIT:  Medications  aspirin chewable tablet 243 mg (has no administration in time range)     ED Discharge Orders    None      *Please note:  Joel Harrell Hearn was evaluated in Emergency Department on 01/20/2019 for the symptoms described in the history of present illness. He was evaluated in the context of the global COVID-19 pandemic, which necessitated consideration that the patient might be at risk for infection with the SARS-CoV-2 virus that causes COVID-19. Institutional protocols and algorithms that pertain to the evaluation of patients at risk for COVID-19 are in a state of rapid change based on information released by regulatory bodies including the  CDC and federal and state organizations. These policies and algorithms were followed during the patient's care in the ED.  Some ED evaluations and interventions may be delayed as a result of limited staffing during the pandemic.*  Note:  This document was prepared using Dragon voice recognition software and may include unintentional dictation errors.   Loleta RoseForbach, Josafat Enrico, MD 01/20/19 [id number]

## 2019-01-20 NOTE — Discharge Instructions (Signed)

## 2019-04-20 ENCOUNTER — Emergency Department
Admission: EM | Admit: 2019-04-20 | Discharge: 2019-04-20 | Disposition: A | Discharge status: Home / Self Care | Payer: Medicare Other | Attending: Emergency Medicine | Admitting: Emergency Medicine

## 2019-04-20 ENCOUNTER — Emergency Department: Payer: Medicare Other

## 2019-04-20 ENCOUNTER — Other Ambulatory Visit: Payer: Self-pay

## 2019-04-20 DIAGNOSIS — R0789 Other chest pain: Secondary | ICD-10-CM | POA: Diagnosis not present

## 2019-04-20 DIAGNOSIS — I110 Hypertensive heart disease with heart failure: Secondary | ICD-10-CM | POA: Insufficient documentation

## 2019-04-20 DIAGNOSIS — J45909 Unspecified asthma, uncomplicated: Secondary | ICD-10-CM | POA: Diagnosis not present

## 2019-04-20 DIAGNOSIS — M940 Chondrocostal junction syndrome [Tietze]: Secondary | ICD-10-CM | POA: Insufficient documentation

## 2019-04-20 DIAGNOSIS — R0602 Shortness of breath: Secondary | ICD-10-CM | POA: Insufficient documentation

## 2019-04-20 DIAGNOSIS — Z7982 Long term (current) use of aspirin: Secondary | ICD-10-CM | POA: Insufficient documentation

## 2019-04-20 DIAGNOSIS — I509 Heart failure, unspecified: Secondary | ICD-10-CM | POA: Insufficient documentation

## 2019-04-20 DIAGNOSIS — Z79899 Other long term (current) drug therapy: Secondary | ICD-10-CM | POA: Diagnosis not present

## 2019-04-20 DIAGNOSIS — R079 Chest pain, unspecified: Secondary | ICD-10-CM | POA: Diagnosis present

## 2019-04-20 LAB — BASIC METABOLIC PANEL
Anion gap: 10 (ref 5–15)
BUN: 14 mg/dL (ref 8–23)
CO2: 24 mmol/L (ref 22–32)
Calcium: 8.8 mg/dL — ABNORMAL LOW (ref 8.9–10.3)
Chloride: 103 mmol/L (ref 98–111)
Creatinine, Ser: 0.92 mg/dL (ref 0.61–1.24)
GFR calc Af Amer: >60 mL/min (ref >60)
GFR calc non Af Amer: >60 mL/min (ref >60)
Glucose, Bld: 116 mg/dL — ABNORMAL HIGH (ref 70–99)
Potassium: 3.3 mmol/L — ABNORMAL LOW (ref 3.5–5.1)
Sodium: 137 mmol/L (ref 135–145)

## 2019-04-20 LAB — CBC
HCT: 47.6 % (ref 39.0–52.0)
Hemoglobin: 16.9 g/dL (ref 13.0–17.0)
MCH: 32.4 pg (ref 26.0–34.0)
MCHC: 35.5 g/dL (ref 30.0–36.0)
MCV: 91.2 fL (ref 80.0–100.0)
Platelets: 188 10*3/uL (ref 150–400)
RBC: 5.22 MIL/uL (ref 4.22–5.81)
RDW: 11.9 % (ref 11.5–15.5)
WBC: 5.7 10*3/uL (ref 4.0–10.5)
nRBC: 0 % (ref 0.0–0.2)

## 2019-04-20 LAB — TROPONIN I (HIGH SENSITIVITY)
Troponin I (High Sensitivity): 8 ng/L (ref <18)
Troponin I (High Sensitivity): 6 ng/L (ref <18)

## 2019-04-20 LAB — FIBRIN DERIVATIVES D-DIMER (ARMC ONLY): Fibrin derivatives D-dimer (ARMC): 257.44 ng/mL (FEU) (ref 0.00–499.00)

## 2019-04-20 MED ORDER — NAPROXEN 375 MG PO TABS: 375.0000 mg | ORAL_TABLET | Freq: Two times a day (BID) | ORAL | 0 refills | Status: DC

## 2019-04-20 MED ORDER — NAPROXEN 375 MG PO TABS: 375.0000 mg | ORAL_TABLET | Freq: Two times a day (BID) | ORAL | 0 refills | Status: AC

## 2019-04-20 MED ORDER — ALBUTEROL SULFATE (2.5 MG/3ML) 0.083% IN NEBU
2.5000 mg | INHALATION_SOLUTION | Freq: Once | RESPIRATORY_TRACT | Status: AC
  Administered 2019-04-20: 15:00:00 2.5 mg via RESPIRATORY_TRACT
  Filled 2019-04-20: qty 3

## 2019-04-20 MED ORDER — AMLODIPINE BESYLATE 5 MG PO TABS: 5.0000 mg | ORAL_TABLET | Freq: Every day | ORAL | 1 refills | Status: DC

## 2019-04-20 MED ORDER — LISINOPRIL 20 MG PO TABS: 20.0000 mg | ORAL_TABLET | Freq: Every day | ORAL | 1 refills | Status: DC

## 2019-04-20 MED ORDER — KETOROLAC TROMETHAMINE 60 MG/2ML IM SOLN
60.0000 mg | Freq: Once | INTRAMUSCULAR | Status: AC
  Administered 2019-04-20: 60 mg via INTRAMUSCULAR
  Filled 2019-04-20: qty 2

## 2019-04-20 MED ORDER — PREDNISONE 20 MG PO TABS
60.0000 mg | ORAL_TABLET | Freq: Once | ORAL | Status: AC
  Administered 2019-04-20: 15:00:00 60 mg via ORAL
  Filled 2019-04-20: qty 3

## 2019-04-20 NOTE — ED Provider Notes (Signed)
[facility] Emergency Department Provider Note  ____________________________________________   First MD Initiated Contact with Patient 04/20/19 1426     (approximate)  I have reviewed the triage vital signs and the nursing notes.   HISTORY  Chief Complaint Chest Pain and Shortness of Breath    HPI Joel Harrell is a 63 y.o. male with history of hypertension, reported CHF though he does not have a cardiologist, asthma, here with very atypical chest pain.  The patient states that off and on for the last several days, he has had an intermittent, sharp, left, parasternal chest pain.  The pain is somewhat worse with palpation and movement.  He states that he has been somewhat more short of breath recently, and has been coughing more than usual.  He has been using his nebulizer, which does improve his symptoms, though only briefly.  He states that the pain has been very transient, and he denies any specific alleviating or aggravating factors.  For example, he was able to walk with his family this morning without any kind of difficulty or chest pain, but upon returning inside, he began coughing, then experienced the pain.  No hemoptysis.  No leg swelling.  No acute recent immobilization or history of DVT.  No recent surgeries.  He has had similar pain in the past and was referred to cardiology, but has not set up an appointment.  Denies any specific alleviating factors.       Past Medical History:  Diagnosis Date  . Asthma   . CHF (congestive heart failure) (HCC)   . Hypertension     There are no active problems to display for this patient.   History reviewed. No pertinent surgical history.  Prior to Admission medications   Medication Sig Start Date End Date Taking? Authorizing Provider  albuterol (VENTOLIN HFA) 108 (90 Base) MCG/ACT inhaler Inhale 2 puffs into the lungs every 6 (six) hours as needed for wheezing or shortness of breath. 12/05/18   Jene Every,  MD  amLODipine (NORVASC) 5 MG tablet Take 1 tablet (5 mg total) by mouth daily. 04/20/19 04/19/20  Shaune Pollack, MD  aspirin 81 MG EC tablet Take 81 mg by mouth daily.      [provider]  budesonide-formoterol (SYMBICORT) 160-4.5 MCG/ACT inhaler Inhale 2 puffs into the lungs 2 (two) times daily. 12/05/18   Jene Every, MD  lisinopril (ZESTRIL) 20 MG tablet Take 1 tablet (20 mg total) by mouth daily. 04/20/19 04/19/20  Shaune Pollack, MD  montelukast (SINGULAIR) 10 MG tablet Take 1 tablet (10 mg total) by mouth at bedtime. 12/28/15 12/27/16  Jennye Moccasin, MD  naproxen (NAPROSYN) 375 MG tablet Take 1 tablet (375 mg total) by mouth 2 (two) times daily with a meal for 5 days. 04/20/19 04/25/19  Shaune Pollack, MD  omeprazole (PRILOSEC) 20 MG capsule Take 20 mg by mouth daily.      [provider]  predniSONE (DELTASONE) 50 MG tablet Take 1 tablet (50 mg total) by mouth daily with breakfast. 12/05/18   Jene Every, MD    Allergies Patient has no known allergies.  Family History  Problem Relation Age of Onset  . Heart attack Father   . Cancer Mother     Social History Social History   Tobacco Use  . Smoking status: Never Smoker  . Smokeless tobacco: Never Used  Substance Use Topics  . Alcohol use: Yes    Alcohol/week: 10.0 standard drinks    Types: 10 Cans  of beer per week  . Drug use: No    Review of Systems  Review of Systems  Constitutional: Positive for fatigue. Negative for chills and fever.  HENT: Negative for sore throat.   Respiratory: Positive for chest tightness and shortness of breath.   Cardiovascular: Positive for chest pain.  Gastrointestinal: Negative for abdominal pain.  Genitourinary: Negative for flank pain.  Musculoskeletal: Negative for neck pain.  Skin: Negative for rash and wound.  Allergic/Immunologic: Negative for immunocompromised state.  Neurological: Negative for weakness and numbness.  Hematological: Does not bruise/bleed  easily.     ____________________________________________  PHYSICAL EXAM:      VITAL SIGNS: ED Triage Vitals  Enc Vitals Group     BP 04/20/19 1205 (!) 145/80     Pulse Rate 04/20/19 1205 (!) 105     Resp 04/20/19 1205 18     Temp 04/20/19 1205 97.8 F (36.6 C)     Temp Source 04/20/19 1205 Oral     SpO2 04/20/19 1205 97 %     Weight 04/20/19 1206 220 lb (99.8 kg)     Height 04/20/19 1206 6' (1.829 m)     Head Circumference --      Peak Flow --      Pain Score 04/20/19 1205 5     Pain Loc --      Pain Edu? --      Excl. in GC? --      Physical Exam Vitals signs and nursing note reviewed.  Constitutional:      General: He is not in acute distress.    Appearance: He is well-developed.  HENT:     Head: Normocephalic and atraumatic.  Eyes:     Conjunctiva/sclera: Conjunctivae normal.  Neck:     Musculoskeletal: Neck supple.  Cardiovascular:     Rate and Rhythm: Normal rate and regular rhythm.     Heart sounds: Normal heart sounds. No murmur. No friction rub.  Pulmonary:     Effort: Pulmonary effort is normal. No respiratory distress.     Breath sounds: Normal breath sounds. No wheezing or rales.  Abdominal:     General: There is no distension.     Palpations: Abdomen is soft.     Tenderness: There is no abdominal tenderness.  Skin:    General: Skin is warm.     Capillary Refill: Capillary refill takes less than 2 seconds.  Neurological:     Mental Status: He is alert and oriented to person, place, and time.     Motor: No abnormal muscle tone.       ____________________________________________   LABS (all labs ordered are listed, but only abnormal results are displayed)  Labs Reviewed  BASIC METABOLIC PANEL - Abnormal; Notable for the following components:      Result Value   Potassium 3.3 (*)    Glucose, Bld 116 (*)    Calcium 8.8 (*)    All other components within normal limits  CBC  FIBRIN DERIVATIVES D-DIMER ([facility] ONLY)  TROPONIN I (HIGH  SENSITIVITY)  TROPONIN I (HIGH SENSITIVITY)    ____________________________________________  EKG: Normal sinus rhythm, ventricular rate 98.  PR 170, QRS 82, QTc 449.  No acute ST or T-segment changes.  No evidence of acute ischemia or infarct. ________________________________________  RADIOLOGY All imaging, including plain films, CT scans, and ultrasounds, independently reviewed by me, and interpretations confirmed via formal radiology reads.  ED MD interpretation:   Chest x-ray: Clear, no pneumonia, no abnormality noted.  Official radiology report(s): Dg Chest 2 View  Result Date: 04/20/2019 CLINICAL DATA:  Shortness of breath EXAM: CHEST - 2 VIEW COMPARISON:  01/19/2019 FINDINGS: The heart size and mediastinal contours are within normal limits. Both lungs are clear. The visualized skeletal structures are unremarkable. IMPRESSION: No active cardiopulmonary disease. Electronically Signed   By: Duanne GuessNicholas  Plundo M.D.   On: 04/20/2019 13:27    ____________________________________________  PROCEDURES   Procedure(s) performed (including Critical Care):  Procedures  ____________________________________________  INITIAL IMPRESSION / MDM / ASSESSMENT AND PLAN / ED COURSE  As part of my medical decision making, I reviewed the following data within the electronic MEDICAL RECORD NUMBER       *Assunta CurtisObra Klecka was evaluated in Emergency Department on 04/20/2019 for the symptoms described in the history of present illness. He was evaluated in the context of the global COVID-19 pandemic, which necessitated consideration that the patient might be at risk for infection with the SARS-CoV-2 virus that causes COVID-19. Institutional protocols and algorithms that pertain to the evaluation of patients at risk for COVID-19 are in a state of rapid change based on information released by regulatory bodies including the CDC and federal and state organizations. These policies and algorithms were followed during the  patient's care in the ED.  Some ED evaluations and interventions may be delayed as a result of limited staffing during the pandemic.*   Clinical Course as of Apr 20 1535  Thu Apr 20, 2019  [id number] 63 year old male here with atypical chest pain.  The patient has been seen multiple times for this in the past.  His pain is pinpoint, located in the left intercostal spaces, and I suspect he has musculoskeletal chest wall pain.  It is reproducible on my examination. EKG is non-ischemic and troponin neg x 2 with low-risk HEART score - low concern for acute ACS at this time, and he has already been referred to Cardiology as an outpatient. Otherwise, CXR is clear. No PNA, PTX. D-Dimer neg, and pain, time course, and presentation is not highly concerning for PE or dissection. He does have some mild end-expiratory wheezes on exam with h/o asthma, and I suspect his chest wall pain is 2/2 coughing spells from his asthma/COPD. Will treat as such, refer for outpatient follow-up. Good return precautions given.   [CI]    Clinical Course User Index [CI] Shaune PollackIsaacs, Teruko Joswick, MD    Medical Decision Making:  As above.  Symptoms are completely resolved after Toradol.  On further discussion, the patient states that he actually now remembers lifting a heavy object to help someone move just prior to the onset of his pain.  I suspect this, more so than his chronic wheezing, is the likely etiology for his chest wall pain.  He will continue to use his inhaler as needed, but feel he would benefit from an NSAID regimen for the next 5 days.  He was given a steroid here, but given his otherwise normal work of breathing, normal sats, resolution of wheezing with albuterol, will hold to prevent GI distress and side effects in addition to the NSAIDs.  ____________________________________________  FINAL CLINICAL IMPRESSION(S) / ED DIAGNOSES  Final diagnoses:  Atypical chest pain  Costochondritis     MEDICATIONS GIVEN DURING THIS VISIT:   Medications  predniSONE (DELTASONE) tablet 60 mg (60 mg Oral Given 04/20/19 1445)  albuterol (PROVENTIL) (2.5 MG/3ML) 0.083% nebulizer solution 2.5 mg (2.5 mg Nebulization Given 04/20/19 1446)  ketorolac (TORADOL) injection 60 mg (60 mg Intramuscular Given 04/20/19 1446)  ED Discharge Orders         Ordered    naproxen (NAPROSYN) 375 MG tablet  2 times daily with meals     04/20/19 1531    amLODipine (NORVASC) 5 MG tablet  Daily     04/20/19 1531    lisinopril (ZESTRIL) 20 MG tablet  Daily     04/20/19 1531           Note:  This document was prepared using Dragon voice recognition software and may include unintentional dictation errors.   Duffy Bruce, MD 04/20/19 1536

## 2019-04-20 NOTE — ED Triage Notes (Signed)
Reports SOB X 3-4 days and chest pain on left side of chest that started yesterday. Denies cough/ congestion or recent sickness. Pt alert and oriented X4, cooperative, RR even and unlabored, color WNL. Pt in NAD.

## 2019-04-20 NOTE — Discharge Instructions (Addendum)
As we discussed, I suspect your pain could be from muscles in your chest.  For this, I prescribed Naprosyn 375 mg.  Take this twice a day for 5 days with a meal.  Do not take ibuprofen, Aleve, or other NSAID pain medications while taking this.  This should help with the pain and inflammation.  You can take Tylenol over-the-counter as well.  Follow-up with a doctor in the next 1 to 2 months for refills of your medications.

## 2019-04-20 NOTE — ED Notes (Signed)
Pt alert and oriented X 4, stable for discharge. RR even and unlabored, color WNL. Discussed discharge instructions and follow up when appropriate. Instructed to follow up with ER for any life threatening symptoms or concerns that patient or family of patient may have  

## 2019-04-20 NOTE — ED Notes (Signed)
ED Provider at bedside. 

## 2019-06-08 ENCOUNTER — Emergency Department: Payer: Medicare Other

## 2019-06-08 ENCOUNTER — Other Ambulatory Visit: Payer: Self-pay

## 2019-06-08 ENCOUNTER — Emergency Department
Admission: EM | Admit: 2019-06-08 | Discharge: 2019-06-09 | Disposition: A | Discharge status: Home / Self Care | Payer: Medicare Other | Attending: Student | Admitting: Student

## 2019-06-08 DIAGNOSIS — Z7982 Long term (current) use of aspirin: Secondary | ICD-10-CM | POA: Insufficient documentation

## 2019-06-08 DIAGNOSIS — S61313A Laceration without foreign body of left middle finger with damage to nail, initial encounter: Secondary | ICD-10-CM

## 2019-06-08 DIAGNOSIS — W268XXA Contact with other sharp object(s), not elsewhere classified, initial encounter: Secondary | ICD-10-CM | POA: Insufficient documentation

## 2019-06-08 DIAGNOSIS — Z760 Encounter for issue of repeat prescription: Secondary | ICD-10-CM | POA: Diagnosis not present

## 2019-06-08 DIAGNOSIS — Y929 Unspecified place or not applicable: Secondary | ICD-10-CM | POA: Diagnosis not present

## 2019-06-08 DIAGNOSIS — Z79899 Other long term (current) drug therapy: Secondary | ICD-10-CM | POA: Diagnosis not present

## 2019-06-08 DIAGNOSIS — Y999 Unspecified external cause status: Secondary | ICD-10-CM | POA: Insufficient documentation

## 2019-06-08 DIAGNOSIS — Y939 Activity, unspecified: Secondary | ICD-10-CM | POA: Insufficient documentation

## 2019-06-08 MED ORDER — LISINOPRIL 20 MG PO TABS: 20.0000 mg | ORAL_TABLET | Freq: Every day | ORAL | 1 refills | Status: DC

## 2019-06-08 MED ORDER — CEPHALEXIN 500 MG PO CAPS: 500.0000 mg | ORAL_CAPSULE | Freq: Four times a day (QID) | ORAL | 0 refills | Status: DC

## 2019-06-08 MED ORDER — ALBUTEROL SULFATE (2.5 MG/3ML) 0.083% IN NEBU: 2.5000 mg | INHALATION_SOLUTION | Freq: Four times a day (QID) | RESPIRATORY_TRACT | 0 refills | Status: DC | PRN

## 2019-06-08 MED ORDER — CEPHALEXIN 500 MG PO CAPS: 500.0000 mg | ORAL_CAPSULE | Freq: Four times a day (QID) | ORAL | 0 refills | Status: AC

## 2019-06-08 MED ORDER — TETANUS-DIPHTH-ACELL PERTUSSIS 5-2.5-18.5 LF-MCG/0.5 IM SUSP
0.5000 mL | Freq: Once | INTRAMUSCULAR | Status: AC
  Administered 2019-06-08: 0.5 mL via INTRAMUSCULAR
  Filled 2019-06-08: qty 0.5

## 2019-06-08 MED ORDER — AMLODIPINE BESYLATE 5 MG PO TABS: 5.0000 mg | ORAL_TABLET | Freq: Every day | ORAL | 1 refills | Status: DC

## 2019-06-08 MED ORDER — ALBUTEROL SULFATE HFA 108 (90 BASE) MCG/ACT IN AERS: 2.0000 | INHALATION_SPRAY | Freq: Four times a day (QID) | RESPIRATORY_TRACT | 0 refills | Status: DC | PRN

## 2019-06-08 MED ORDER — CEPHALEXIN 500 MG PO CAPS
500.0000 mg | ORAL_CAPSULE | Freq: Once | ORAL | Status: AC
  Administered 2019-06-08: 500 mg via ORAL
  Filled 2019-06-08: qty 1

## 2019-06-08 MED ORDER — LIDOCAINE HCL (PF) 1 % IJ SOLN
5.0000 mL | Freq: Once | INTRAMUSCULAR | Status: AC
  Administered 2019-06-08: 5 mL via INTRADERMAL
  Filled 2019-06-08: qty 5

## 2019-06-08 NOTE — ED Triage Notes (Signed)
Pt arrives to ED with lac to L middle finger, states cut on can tonight. Wrapped by pt. No blood thinner use. A&O, ambulatory. No distress noted.

## 2019-06-08 NOTE — ED Notes (Signed)
PA remains at bedside discussing home meds with pt. Will give ordered meds once PA done.

## 2019-06-08 NOTE — ED Notes (Signed)
Caryl Pina, PA at bedside with lidocaine. Dara at bedside for imaging.

## 2019-06-08 NOTE — ED Notes (Signed)
Pt has lac to tip of middle L hand finger. Got lac from open can. Bleeding currently controlled. Pt states unsure of when last tetanus shot was. Pt can move finger appropriately.

## 2019-06-08 NOTE — Discharge Instructions (Addendum)
Please establish with primary care.  Sutures need to be removed in 7 days.  I have given you prescription for Keflex to prevent any infection.  I have also refilled your blood pressure medications and your albuterol for a short time.

## 2019-06-08 NOTE — ED Provider Notes (Signed)
[facility] Emergency Department Provider Note  ____________________________________________  Time seen: Approximately 11:20 PM  I have reviewed the triage vital signs and the nursing notes.   HISTORY  Chief Complaint Laceration    HPI Joel Harrell is a 63 y.o. male that presents to emergency department for evaluation of left middle finger laceration.  Patient cut his finger on a beam can.  He is unsure of last tetanus.  Patient would also like a refill on his blood pressure medications and his albuterol.  Patient states that he only has a couple of pills left of his blood pressure medications.  He also only has a small amount left on his albuterol nebulizer treatment and does not believe there is anything left in his inhaler. He would also like a flu shot.  No current complaints.  No headache, shortness of breath, chest pain.  He no longer has primary care but states that he has good insurance he just has not been able to get established with anyone new.  He would like a referral.    Past Medical History:  Diagnosis Date  . Asthma   . CHF (congestive heart failure) (HCC)   . Hypertension     There are no active problems to display for this patient.   History reviewed. No pertinent surgical history.  Prior to Admission medications   Medication Sig Start Date End Date Taking? Authorizing Provider  albuterol (PROVENTIL) (2.5 MG/3ML) 0.083% nebulizer solution Take 3 mLs (2.5 mg total) by nebulization every 6 (six) hours as needed for wheezing or shortness of breath. 06/08/19   Enid Derry, PA-C  albuterol (VENTOLIN HFA) 108 (90 Base) MCG/ACT inhaler Inhale 2 puffs into the lungs every 6 (six) hours as needed for wheezing or shortness of breath. 06/08/19   Enid Derry, PA-C  amLODipine (NORVASC) 5 MG tablet Take 1 tablet (5 mg total) by mouth daily. 06/08/19 06/07/20  Enid Derry, PA-C  aspirin 81 MG EC tablet Take 81 mg by mouth daily.      [provider]  budesonide-formoterol (SYMBICORT) 160-4.5 MCG/ACT inhaler Inhale 2 puffs into the lungs 2 (two) times daily. 12/05/18   Jene Every, MD  cephALEXin (KEFLEX) 500 MG capsule Take 1 capsule (500 mg total) by mouth 4 (four) times daily for 10 days. 06/08/19 06/18/19  Enid Derry, PA-C  lisinopril (ZESTRIL) 20 MG tablet Take 1 tablet (20 mg total) by mouth daily. 06/08/19 06/07/20  Enid Derry, PA-C  montelukast (SINGULAIR) 10 MG tablet Take 1 tablet (10 mg total) by mouth at bedtime. 12/28/15 12/27/16  Jennye Moccasin, MD  omeprazole (PRILOSEC) 20 MG capsule Take 20 mg by mouth daily.      [provider]  predniSONE (DELTASONE) 50 MG tablet Take 1 tablet (50 mg total) by mouth daily with breakfast. 12/05/18   Jene Every, MD    Allergies Patient has no known allergies.  Family History  Problem Relation Age of Onset  . Heart attack Father   . Cancer Mother     Social History Social History   Tobacco Use  . Smoking status: Never Smoker  . Smokeless tobacco: Never Used  Substance Use Topics  . Alcohol use: Yes    Alcohol/week: 10.0 standard drinks    Types: 10 Cans of beer per week  . Drug use: No     Review of Systems  Musculoskeletal: Positive for finger pain. Skin: Negative for rash, abrasions, ecchymosis. Positive for laceration. Neurological: Negative for numbness or tingling  ____________________________________________   PHYSICAL EXAM:  VITAL SIGNS: ED Triage Vitals  Enc Vitals Group     BP 06/08/19 2304 126/80     Pulse Rate 06/08/19 2304 92     Resp 06/08/19 2304 16     Temp 06/08/19 2304 98.3 F (36.8 C)     Temp Source 06/08/19 2304 Oral     SpO2 06/08/19 2304 98 %     Weight 06/08/19 2305 220 lb (99.8 kg)     Height 06/08/19 2305 6\' 2"  (1.88 m)     Head Circumference --      Peak Flow --      Pain Score 06/08/19 2305 6     Pain Loc --      Pain Edu? --      Excl. in GC? --      Constitutional: Alert and oriented.  Well appearing and in no acute distress. Eyes: Conjunctivae are normal. PERRL. EOMI. Head: Atraumatic. ENT:      Ears:      Nose: No congestion/rhinnorhea.      Mouth/Throat: Mucous membranes are moist.  Neck: No stridor.  Cardiovascular: Normal rate, regular rhythm.  Good peripheral circulation. Respiratory: Normal respiratory effort without tachypnea or retractions. Lungs CTAB. Good air entry to the bases with no decreased or absent breath sounds. Musculoskeletal: Full range of motion to all extremities. No gross deformities appreciated. 1 cm flap laceration to distal left middle finger. Distal fingernail avulsed. Neurologic:  Normal speech and language. No gross focal neurologic deficits are appreciated.  Skin:  Skin is warm, dry and intact. No rash noted. Psychiatric: Mood and affect are normal. Speech and behavior are normal. Patient exhibits appropriate insight and judgement.   ____________________________________________   LABS (all labs ordered are listed, but only abnormal results are displayed)  Labs Reviewed - No data to display ____________________________________________  EKG   ____________________________________________  RADIOLOGY   Dg Finger Middle Left  Result Date: 06/08/2019 CLINICAL DATA:  Recent laceration on can, initial encounter EXAM: LEFT MIDDLE FINGER 2+V COMPARISON:  None. FINDINGS: Soft tissue laceration is noted distally. No radiopaque foreign body is noted. No bony abnormality is seen. IMPRESSION: Soft tissue laceration in the distal third digit without bony abnormality. Electronically Signed   By: Alcide CleverMark  Lukens M.D.   On: 06/08/2019 23:44    ____________________________________________    PROCEDURES  Procedure(s) performed:    Procedures  LACERATION REPAIR Performed by: Enid DerryAshley Bastion Bolger  Consent: Verbal consent obtained.  Consent given by: patient  Prepped and Draped in normal sterile fashion  Wound explored: No foreign bodies    Laceration Location: finger  Laceration Length: 1 cm  Anesthesia: None  Local anesthetic: lidocaine 1% without epinephrine  Anesthetic total: 4 ml  Irrigation method: syringe  Amount of cleaning: 500ml normal saline  Skin closure: 4-0 nylon  Number of sutures: 3  Technique: Simple interrupted  Patient tolerance: Patient tolerated the procedure well with no immediate complications.  Medications  Tdap (BOOSTRIX) injection 0.5 mL (has no administration in time range)  lidocaine (PF) (XYLOCAINE) 1 % injection 5 mL (5 mLs Intradermal Given 06/08/19 2337)  cephALEXin (KEFLEX) capsule 500 mg (500 mg Oral Given 06/08/19 2352)     ____________________________________________   INITIAL IMPRESSION / ASSESSMENT AND PLAN / ED COURSE  Pertinent labs & imaging results that were available during my care of the patient were reviewed by me and considered in my medical decision making (see chart for details).  Review of the [place] CSRS was  performed in accordance of the [place] prior to dispensing any controlled drugs.     Patient's diagnosis is consistent with finger laceration.  Vital signs and exam are reassuring.  X-ray negative for acute bony abnormalities.  Laceration was repaired with stitches.  Finger splint was placed.  Patient is also requesting refills on his blood pressure medications and his albuterol.  He was given resources for primary care.  Patient will be discharged home with prescriptions for keflex, lisinopril, amlodipine, albuterol, albuterol nebulizer. Patient is to follow up with PCP as directed.  Referrals were given. He will follow up with PCP or health department for flu vaccine. Patient is given ED precautions to return to the ED for any worsening or new symptoms.   Joel Harrell was evaluated in Emergency Department on 06/08/2019 for the symptoms described in the history of present illness. He was evaluated in the context of the global COVID-19 pandemic, which  necessitated consideration that the patient might be at risk for infection with the SARS-CoV-2 virus that causes COVID-19. Institutional protocols and algorithms that pertain to the evaluation of patients at risk for COVID-19 are in a state of rapid change based on information released by regulatory bodies including the CDC and federal and state organizations. These policies and algorithms were followed during the patient's care in the ED.  ____________________________________________  FINAL CLINICAL IMPRESSION(S) / ED DIAGNOSES  Final diagnoses:  Laceration of left middle finger without foreign body with damage to nail, initial encounter  Encounter for medication refill      NEW MEDICATIONS STARTED DURING THIS VISIT:  ED Discharge Orders         Ordered    cephALEXin (KEFLEX) 500 MG capsule  4 times daily,   Status:  Discontinued     06/08/19 2325    lisinopril (ZESTRIL) 20 MG tablet  Daily     06/08/19 2350    cephALEXin (KEFLEX) 500 MG capsule  4 times daily     06/08/19 2351    albuterol (VENTOLIN HFA) 108 (90 Base) MCG/ACT inhaler  Every 6 hours PRN     06/08/19 2351    amLODipine (NORVASC) 5 MG tablet  Daily     06/08/19 2351    albuterol (PROVENTIL) (2.5 MG/3ML) 0.083% nebulizer solution  Every 6 hours PRN     06/08/19 2352              This chart was dictated using voice recognition software/Dragon. Despite best efforts to proofread, errors can occur which can change the meaning. Any change was purely unintentional.    Laban Emperor, PA-C 06/09/19 0004    Lilia Pro., MD 06/09/19 [phone]

## 2019-06-24 ENCOUNTER — Other Ambulatory Visit: Payer: Self-pay

## 2019-06-24 ENCOUNTER — Emergency Department: Payer: Medicare Other

## 2019-06-24 ENCOUNTER — Emergency Department
Admission: EM | Admit: 2019-06-24 | Discharge: 2019-06-24 | Disposition: A | Discharge status: Home / Self Care | Payer: Medicare Other | Attending: Emergency Medicine | Admitting: Emergency Medicine

## 2019-06-24 ENCOUNTER — Encounter: Payer: Self-pay | Admitting: Emergency Medicine

## 2019-06-24 DIAGNOSIS — Y939 Activity, unspecified: Secondary | ICD-10-CM | POA: Insufficient documentation

## 2019-06-24 DIAGNOSIS — I509 Heart failure, unspecified: Secondary | ICD-10-CM | POA: Diagnosis not present

## 2019-06-24 DIAGNOSIS — M79652 Pain in left thigh: Secondary | ICD-10-CM | POA: Diagnosis not present

## 2019-06-24 DIAGNOSIS — Z79899 Other long term (current) drug therapy: Secondary | ICD-10-CM | POA: Insufficient documentation

## 2019-06-24 DIAGNOSIS — Y929 Unspecified place or not applicable: Secondary | ICD-10-CM | POA: Diagnosis not present

## 2019-06-24 DIAGNOSIS — S79922A Unspecified injury of left thigh, initial encounter: Secondary | ICD-10-CM | POA: Diagnosis present

## 2019-06-24 DIAGNOSIS — W1849XA Other slipping, tripping and stumbling without falling, initial encounter: Secondary | ICD-10-CM | POA: Diagnosis not present

## 2019-06-24 DIAGNOSIS — S61213D Laceration without foreign body of left middle finger without damage to nail, subsequent encounter: Secondary | ICD-10-CM | POA: Diagnosis not present

## 2019-06-24 DIAGNOSIS — Z7982 Long term (current) use of aspirin: Secondary | ICD-10-CM | POA: Insufficient documentation

## 2019-06-24 DIAGNOSIS — Y999 Unspecified external cause status: Secondary | ICD-10-CM | POA: Insufficient documentation

## 2019-06-24 DIAGNOSIS — J45909 Unspecified asthma, uncomplicated: Secondary | ICD-10-CM | POA: Insufficient documentation

## 2019-06-24 DIAGNOSIS — Z4802 Encounter for removal of sutures: Secondary | ICD-10-CM | POA: Diagnosis not present

## 2019-06-24 DIAGNOSIS — I110 Hypertensive heart disease with heart failure: Secondary | ICD-10-CM | POA: Diagnosis not present

## 2019-06-24 DIAGNOSIS — S7012XA Contusion of left thigh, initial encounter: Secondary | ICD-10-CM | POA: Diagnosis not present

## 2019-06-24 DIAGNOSIS — S8012XA Contusion of left lower leg, initial encounter: Secondary | ICD-10-CM

## 2019-06-24 MED ORDER — ONDANSETRON 8 MG PO TBDP: 8.0000 mg | ORAL_TABLET | Freq: Once | ORAL | Status: DC

## 2019-06-24 MED ORDER — OXYCODONE-ACETAMINOPHEN 5-325 MG PO TABS: 1.0000 | ORAL_TABLET | Freq: Once | ORAL | Status: DC

## 2019-06-24 NOTE — ED Triage Notes (Signed)
Needs stitches removed from left 3rd finger, and wants a bruise checked out on his left inner thigh from a slip 5 days ago. Denies falling, states just hyperextended.

## 2019-06-24 NOTE — ED Provider Notes (Signed)
[facility] Emergency Department Provider Note  ____________________________________________  Time seen: Approximately 3:08 PM  I have reviewed the triage vital signs and the nursing notes.   HISTORY  Chief Complaint Suture / Staple Removal    HPI Joel Harrell is a 63 y.o. male who presents the emergency department for 2 complaints.  Patient is here for suture removal from a laceration that was primary closed.  Patient denies any complications with same, has 3 stitches requiring removal.  Patient is also concerned about pain and ecchymosis to the left medial thigh.  Patient reports that he slipped a few days prior, had a splitting motion but did not actually fall.  Patient is having significant ecchymosis and pain to the medial thigh since.  No other injury or complaint.  Patient is ambulatory at this time.  No medications prior to arrival.  Patient has a history of asthma, CHF and hypertension and no complaints of chronic medical problems.         Past Medical History:  Diagnosis Date  . Asthma   . CHF (congestive heart failure) (HCC)   . Hypertension     There are no problems to display for this patient.   History reviewed. No pertinent surgical history.  Prior to Admission medications   Medication Sig Start Date End Date Taking? Authorizing Provider  albuterol (PROVENTIL) (2.5 MG/3ML) 0.083% nebulizer solution Take 3 mLs (2.5 mg total) by nebulization every 6 (six) hours as needed for wheezing or shortness of breath. 06/08/19   Enid DerryWagner, Ashley, PA-C  albuterol (VENTOLIN HFA) 108 (90 Base) MCG/ACT inhaler Inhale 2 puffs into the lungs every 6 (six) hours as needed for wheezing or shortness of breath. 06/08/19   Enid DerryWagner, Ashley, PA-C  amLODipine (NORVASC) 5 MG tablet Take 1 tablet (5 mg total) by mouth daily. 06/08/19 06/07/20  Enid DerryWagner, Ashley, PA-C  aspirin 81 MG EC tablet Take 81 mg by mouth daily.      [provider]  budesonide-formoterol  (SYMBICORT) 160-4.5 MCG/ACT inhaler Inhale 2 puffs into the lungs 2 (two) times daily. 12/05/18   Jene EveryKinner, Robert, MD  lisinopril (ZESTRIL) 20 MG tablet Take 1 tablet (20 mg total) by mouth daily. 06/08/19 06/07/20  Enid DerryWagner, Ashley, PA-C  montelukast (SINGULAIR) 10 MG tablet Take 1 tablet (10 mg total) by mouth at bedtime. 12/28/15 12/27/16  Jennye MoccasinQuigley, Brian S, MD  omeprazole (PRILOSEC) 20 MG capsule Take 20 mg by mouth daily.      [provider]  predniSONE (DELTASONE) 50 MG tablet Take 1 tablet (50 mg total) by mouth daily with breakfast. 12/05/18   Jene EveryKinner, Robert, MD    Allergies Patient has no known allergies.  Family History  Problem Relation Age of Onset  . Heart attack Father   . Cancer Mother     Social History Social History   Tobacco Use  . Smoking status: Never Smoker  . Smokeless tobacco: Never Used  Substance Use Topics  . Alcohol use: Yes    Alcohol/week: 10.0 standard drinks    Types: 10 Cans of beer per week  . Drug use: No     Review of Systems  Constitutional: No fever/chills Eyes: No visual changes. No discharge ENT: No upper respiratory complaints. Cardiovascular: no chest pain. Respiratory: no cough. No SOB. Gastrointestinal: No abdominal pain.  No nausea, no vomiting.   Musculoskeletal: Pain and ecchymosis to the medial thigh left leg Skin: Sutured laceration to left middle finger Neurological: Negative for headaches, focal weakness or numbness. 10-point  ROS otherwise negative.  ____________________________________________   PHYSICAL EXAM:  VITAL SIGNS: ED Triage Vitals  Enc Vitals Group     BP 06/24/19 1400 (!) 149/89     Pulse Rate 06/24/19 1400 (!) 120     Resp 06/24/19 1400 16     Temp 06/24/19 1400 98.9 F (37.2 C)     Temp Source 06/24/19 1400 Oral     SpO2 06/24/19 1400 95 %     Weight 06/24/19 1401 220 lb (99.8 kg)     Height 06/24/19 1401 6\' 2"  (1.88 m)     Head Circumference --      Peak Flow --      Pain Score 06/24/19  1404 6     Pain Loc --      Pain Edu? --      Excl. in [place]? --      Constitutional: Alert and oriented. Well appearing and in no acute distress. Eyes: Conjunctivae are normal. PERRL. EOMI. Head: Atraumatic. ENT:      Ears:       Nose: No congestion/rhinnorhea.      Mouth/Throat: Mucous membranes are moist.  Neck: No stridor.    Cardiovascular: Normal rate, regular rhythm. Normal S1 and S2.  Good peripheral circulation. Respiratory: Normal respiratory effort without tachypnea or retractions. Lungs CTAB. Good air entry to the bases with no decreased or absent breath sounds. Musculoskeletal: Full range of motion to all extremities. No gross deformities appreciated.  Visualization of the left lower extremity reveals significant ecchymosis along the medial aspect of the thigh.  No other appreciable abnormality about the left lower extremity.  Full range of motion to the left hip, left knee.  No erythema, no warmth.  Area is diffusely tender to palpation along ecchymotic region.  Dorsalis pedis pulses sensation intact distally. Neurologic:  Normal speech and language. No gross focal neurologic deficits are appreciated.  Skin:  Skin is warm, dry and intact. No rash noted. Psychiatric: Mood and affect are normal. Speech and behavior are normal. Patient exhibits appropriate insight and judgement.   ____________________________________________   LABS (all labs ordered are listed, but only abnormal results are displayed)  Labs Reviewed - No data to display ____________________________________________  EKG   ____________________________________________  RADIOLOGY I personally viewed and evaluated these images as part of my medical decision making, as well as reviewing the written report by the radiologist.  14/12/20 Venous Img Lower Unilateral Left  Result Date: 06/24/2019 CLINICAL DATA:  Left thigh pain, ecchymosis EXAM: LEFT LOWER EXTREMITY VENOUS DOPPLER ULTRASOUND TECHNIQUE: Gray-scale  sonography with graded compression, as well as color Doppler and duplex ultrasound were performed to evaluate the lower extremity deep venous systems from the level of the common femoral vein and including the common femoral, femoral, profunda femoral, popliteal and calf veins including the posterior tibial, peroneal and gastrocnemius veins when visible. The superficial great saphenous vein was also interrogated. Spectral Doppler was utilized to evaluate flow at rest and with distal augmentation maneuvers in the common femoral, femoral and popliteal veins. COMPARISON:  None. FINDINGS: Contralateral Common Femoral Vein: Respiratory phasicity is normal and symmetric with the symptomatic side. No evidence of thrombus. Normal compressibility. Common Femoral Vein: No evidence of thrombus. Normal compressibility, respiratory phasicity and response to augmentation. Saphenofemoral Junction: No evidence of thrombus. Normal compressibility and flow on color Doppler imaging. Profunda Femoral Vein: No evidence of thrombus. Normal compressibility and flow on color Doppler imaging. Femoral Vein: No evidence of thrombus. Normal compressibility, respiratory phasicity and  response to augmentation. Popliteal Vein: No evidence of thrombus. Normal compressibility, respiratory phasicity and response to augmentation. Calf Veins: No evidence of thrombus. Normal compressibility and flow on color Doppler imaging. Superficial Great Saphenous Vein: No evidence of thrombus. Normal compressibility. Venous Reflux:  None. Other Findings:  None. IMPRESSION: No evidence of left lower extremity deep venous thrombosis. Electronically Signed   By: Davina Poke M.D.   On: 06/24/2019 16:35    ____________________________________________    PROCEDURES  Procedure(s) performed:    .Suture Removal  Date/Time: 06/24/2019 3:44 PM Performed by: Darletta Moll, PA-C Authorized by: Darletta Moll, PA-C   Consent:    Consent  obtained:  Verbal   Consent given by:  Patient   Risks discussed:  Pain and wound separation Location:    Location:  Upper extremity   Upper extremity location:  Hand   Hand location:  L long finger Procedure details:    Wound appearance:  No signs of infection and good wound healing   Number of sutures removed:  3 Post-procedure details:    Post-removal:  No dressing applied   Patient tolerance of procedure:  Tolerated well, no immediate complications      Medications - No data to display   ____________________________________________   INITIAL IMPRESSION / ASSESSMENT AND PLAN / ED COURSE  Pertinent labs & imaging results that were available during my care of the patient were reviewed by me and considered in my medical decision making (see chart for details).  Review of the [place] CSRS was performed in accordance of the [place] prior to dispensing any controlled drugs.           Patient's diagnosis is consistent with suture removal, traumatic ecchymosis of the left lower leg.  Patient presented to emergency department with 2 complaints.  Patient had sutures removed with no complication.  Patient also had an injury without direct trauma to the lower extremity but had posttraumatic ecchymosis.  Patient was concerned for blood clot.  Ultrasound is reassuring at this time.  No evidence of muscle or ligament rupture about the left leg.  Patient is to follow-up with orthopedics if patient develops any worsening leg symptoms.  Otherwise, over-the-counter medications for any pain.  Follow-up with orthopedics as described above.. Patient is given ED precautions to return to the ED for any worsening or new symptoms.     ____________________________________________  FINAL CLINICAL IMPRESSION(S) / ED DIAGNOSES  Final diagnoses:  Visit for suture removal  Traumatic ecchymosis of left lower leg, initial encounter      NEW MEDICATIONS STARTED DURING THIS VISIT:  ED Discharge Orders     None          This chart was dictated using voice recognition software/Dragon. Despite best efforts to proofread, errors can occur which can change the meaning. Any change was purely unintentional.    Darletta Moll, PA-C 06/24/19 1734    Nance Pear, MD 06/24/19 Pauline Aus

## 2019-09-10 ENCOUNTER — Encounter: Payer: Self-pay | Admitting: Emergency Medicine

## 2019-09-10 ENCOUNTER — Emergency Department: Payer: Medicare HMO

## 2019-09-10 ENCOUNTER — Other Ambulatory Visit: Payer: Self-pay

## 2019-09-10 ENCOUNTER — Emergency Department
Admission: EM | Admit: 2019-09-10 | Discharge: 2019-09-10 | Disposition: A | Discharge status: Home / Self Care | Payer: Medicare HMO | Attending: Emergency Medicine | Admitting: Emergency Medicine

## 2019-09-10 DIAGNOSIS — I509 Heart failure, unspecified: Secondary | ICD-10-CM | POA: Diagnosis not present

## 2019-09-10 DIAGNOSIS — R42 Dizziness and giddiness: Secondary | ICD-10-CM

## 2019-09-10 DIAGNOSIS — Z7982 Long term (current) use of aspirin: Secondary | ICD-10-CM | POA: Insufficient documentation

## 2019-09-10 DIAGNOSIS — E871 Hypo-osmolality and hyponatremia: Secondary | ICD-10-CM | POA: Diagnosis not present

## 2019-09-10 DIAGNOSIS — J45909 Unspecified asthma, uncomplicated: Secondary | ICD-10-CM | POA: Insufficient documentation

## 2019-09-10 DIAGNOSIS — Z79899 Other long term (current) drug therapy: Secondary | ICD-10-CM | POA: Insufficient documentation

## 2019-09-10 DIAGNOSIS — I110 Hypertensive heart disease with heart failure: Secondary | ICD-10-CM | POA: Diagnosis not present

## 2019-09-10 LAB — CBC
HCT: 46.7 % (ref 39.0–52.0)
Hemoglobin: 16.3 g/dL (ref 13.0–17.0)
MCH: 31.8 pg (ref 26.0–34.0)
MCHC: 34.9 g/dL (ref 30.0–36.0)
MCV: 91.2 fL (ref 80.0–100.0)
Platelets: 211 10*3/uL (ref 150–400)
RBC: 5.12 MIL/uL (ref 4.22–5.81)
RDW: 12.3 % (ref 11.5–15.5)
WBC: 6.7 10*3/uL (ref 4.0–10.5)
nRBC: 0 % (ref 0.0–0.2)

## 2019-09-10 LAB — URINALYSIS, COMPLETE (UACMP) WITH MICROSCOPIC
Bacteria, UA: NONE SEEN
Bilirubin Urine: NEGATIVE
Glucose, UA: NEGATIVE mg/dL
Hgb urine dipstick: NEGATIVE
Ketones, ur: NEGATIVE mg/dL
Leukocytes,Ua: NEGATIVE
Nitrite: NEGATIVE
Protein, ur: NEGATIVE mg/dL
Specific Gravity, Urine: 1.009 (ref 1.005–1.030)
Squamous Epithelial / HPF: NONE SEEN (ref 0–5)
pH: 5 (ref 5.0–8.0)

## 2019-09-10 LAB — BASIC METABOLIC PANEL
Anion gap: 10 (ref 5–15)
BUN: 13 mg/dL (ref 8–23)
CO2: 21 mmol/L — ABNORMAL LOW (ref 22–32)
Calcium: 8.5 mg/dL — ABNORMAL LOW (ref 8.9–10.3)
Chloride: 100 mmol/L (ref 98–111)
Creatinine, Ser: 1.12 mg/dL (ref 0.61–1.24)
GFR calc Af Amer: >60 mL/min (ref >60)
GFR calc non Af Amer: >60 mL/min (ref >60)
Glucose, Bld: 97 mg/dL (ref 70–99)
Potassium: 3.5 mmol/L (ref 3.5–5.1)
Sodium: 131 mmol/L — ABNORMAL LOW (ref 135–145)

## 2019-09-10 LAB — TROPONIN I (HIGH SENSITIVITY)
Troponin I (High Sensitivity): 8 ng/L (ref <18)
Troponin I (High Sensitivity): 8 ng/L (ref <18)

## 2019-09-10 MED ORDER — SODIUM CHLORIDE 0.9 % IV BOLUS
500.0000 mL | Freq: Once | INTRAVENOUS | Status: AC
  Administered 2019-09-10: 500 mL via INTRAVENOUS

## 2019-09-10 MED ORDER — LISINOPRIL 20 MG PO TABS: 20.0000 mg | ORAL_TABLET | Freq: Every day | ORAL | 0 refills | Status: DC

## 2019-09-10 MED ORDER — AMLODIPINE BESYLATE 5 MG PO TABS: 5.0000 mg | ORAL_TABLET | Freq: Every day | ORAL | 0 refills | Status: DC

## 2019-09-10 NOTE — ED Triage Notes (Signed)
Pt arrives ambulatory to triage with c/o of being dizzy the last two days. Pt reports that he has had a HA x 2 days as well. Pt is currently working on getting his medication fixed as he has been out of it for a few days.

## 2019-09-10 NOTE — ED Provider Notes (Signed)
[facility] Emergency Department Provider Note   ____________________________________________   First MD Initiated Contact with Patient 09/10/19 [phone]     (approximate)  I have reviewed the triage vital signs and the nursing notes.   HISTORY  Chief Complaint Dizziness    HPI Joel Harrell is a 64 y.o. male who presents to the ED from home with a chief complaint of dizziness.  Patient reports a 2-day history of dizziness which he describes as movement and causing a headache behind his right eye.  Admits he is out of his blood pressure medications for the past several days.  Denies fever, cough, chest pain, shortness of breath, abdominal pain, nausea or vomiting..      Past Medical History:  Diagnosis Date   Asthma    CHF (congestive heart failure) (HCC)    Hypertension     There are no problems to display for this patient.   History reviewed. No pertinent surgical history.  Prior to Admission medications   Medication Sig Start Date End Date Taking? Authorizing Provider  albuterol (PROVENTIL) (2.5 MG/3ML) 0.083% nebulizer solution Take 3 mLs (2.5 mg total) by nebulization every 6 (six) hours as needed for wheezing or shortness of breath. 06/08/19   Enid Derry, PA-C  albuterol (VENTOLIN HFA) 108 (90 Base) MCG/ACT inhaler Inhale 2 puffs into the lungs every 6 (six) hours as needed for wheezing or shortness of breath. 06/08/19   Enid Derry, PA-C  amLODipine (NORVASC) 5 MG tablet Take 1 tablet (5 mg total) by mouth daily. 09/10/19 09/09/20  Irean Hong, MD  aspirin 81 MG EC tablet Take 81 mg by mouth daily.      [provider]  budesonide-formoterol (SYMBICORT) 160-4.5 MCG/ACT inhaler Inhale 2 puffs into the lungs 2 (two) times daily. 12/05/18   Jene Every, MD  lisinopril (ZESTRIL) 20 MG tablet Take 1 tablet (20 mg total) by mouth daily. 09/10/19 10/10/19  Irean Hong, MD  montelukast (SINGULAIR) 10 MG tablet Take 1 tablet (10 mg  total) by mouth at bedtime. 12/28/15 12/27/16  Jennye Moccasin, MD  omeprazole (PRILOSEC) 20 MG capsule Take 20 mg by mouth daily.      [provider]  predniSONE (DELTASONE) 50 MG tablet Take 1 tablet (50 mg total) by mouth daily with breakfast. 12/05/18   Jene Every, MD    Allergies Patient has no known allergies.  Family History  Problem Relation Age of Onset   Heart attack Father    Cancer Mother     Social History Social History   Tobacco Use   Smoking status: Never Smoker   Smokeless tobacco: Never Used  Substance Use Topics   Alcohol use: Yes    Alcohol/week: 10.0 standard drinks    Types: 10 Cans of beer per week   Drug use: No    Review of Systems  Constitutional: No fever/chills Eyes: No visual changes. ENT: No sore throat. Cardiovascular: Denies chest pain. Respiratory: Denies shortness of breath. Gastrointestinal: No abdominal pain.  No nausea, no vomiting.  No diarrhea.  No constipation. Genitourinary: Negative for dysuria. Musculoskeletal: Negative for back pain. Skin: Negative for rash. Neurological: Positive for dizziness.  Negative for headaches, focal weakness or numbness.   ____________________________________________   PHYSICAL EXAM:  VITAL SIGNS: ED Triage Vitals  Enc Vitals Group     BP 09/10/19 0110 129/80     Pulse Rate 09/10/19 0110 94     Resp 09/10/19 0110 18     Temp  09/10/19 0110 98 F (36.7 C)     Temp Source 09/10/19 0110 Oral     SpO2 09/10/19 0110 97 %     Weight 09/10/19 0112 235 lb (106.6 kg)     Height 09/10/19 0112 6\' 3"  (1.905 m)     Head Circumference --      Peak Flow --      Pain Score 09/10/19 0110 5     Pain Loc --      Pain Edu? --      Excl. in [place]? --     Constitutional: Alert and oriented. Well appearing and in no acute distress. Eyes: Conjunctivae are normal. PERRL. EOMI. Head: Atraumatic. Nose: No congestion/rhinnorhea. Mouth/Throat: Mucous membranes are moist.   Neck: No stridor.   No carotid bruits.  Supple neck without meningismus. Cardiovascular: Normal rate, regular rhythm. Grossly normal heart sounds.  Good peripheral circulation. Respiratory: Normal respiratory effort.  No retractions. Lungs CTAB. Gastrointestinal: Soft and nontender. No distention. No abdominal bruits. No CVA tenderness. Musculoskeletal: No lower extremity tenderness nor edema.  No joint effusions. Neurologic: Alert and oriented x3.  CN II to XII grossly intact.  Normal speech and language. No gross focal neurologic deficits are appreciated.  MAE x4.  No gait instability. Skin:  Skin is warm, dry and intact. No rash noted. Psychiatric: Mood and affect are normal. Speech and behavior are normal.  ____________________________________________   LABS (all labs ordered are listed, but only abnormal results are displayed)  Labs Reviewed  BASIC METABOLIC PANEL - Abnormal; Notable for the following components:      Result Value   Sodium 131 (*)    CO2 21 (*)    Calcium 8.5 (*)    All other components within normal limits  URINALYSIS, COMPLETE (UACMP) WITH MICROSCOPIC - Abnormal; Notable for the following components:   Color, Urine YELLOW (*)    APPearance CLEAR (*)    All other components within normal limits  CBC  CBG MONITORING, ED  TROPONIN I (HIGH SENSITIVITY)  TROPONIN I (HIGH SENSITIVITY)   ____________________________________________  EKG  ED ECG REPORT I, Walsie Smeltz J, the attending physician, personally viewed and interpreted this ECG.   Date: 09/10/2019  EKG Time: 0119  Rate: 89  Rhythm: normal EKG, normal sinus rhythm  Axis: Normal  Intervals:none  ST&T Change: T wave inversion inferior leads New T wave inversion from 04/2019 ____________________________________________  RADIOLOGY  ED MD interpretation: No ICH  Official radiology report(s): CT Head Wo Contrast  Result Date: 09/10/2019 CLINICAL DATA:  Acute presentation with dizziness over the last 2 days. EXAM: CT  HEAD WITHOUT CONTRAST TECHNIQUE: Contiguous axial images were obtained from the base of the skull through the vertex without intravenous contrast. COMPARISON:  01/01/2013 FINDINGS: Brain: The brain shows a normal appearance without evidence of malformation, atrophy, old or acute small or large vessel infarction, mass lesion, hemorrhage, hydrocephalus or extra-axial collection. Vascular: No hyperdense vessel. No evidence of atherosclerotic calcification. Skull: Normal.  No traumatic finding.  No focal bone lesion. Sinuses/Orbits: Sinuses are clear. Orbits appear normal. Mastoids are clear. Other: None significant IMPRESSION: Normal head CT. Electronically Signed   By: Nelson Chimes M.D.   On: 09/10/2019 05:35    ____________________________________________   PROCEDURES  Procedure(s) performed (including Critical Care):  Procedures   ____________________________________________   INITIAL IMPRESSION / ASSESSMENT AND PLAN / ED COURSE  As part of my medical decision making, I reviewed the following data within the [place] notes reviewed  and incorporated, Labs reviewed, EKG interpreted, Old EKG reviewed, Old chart reviewed, Radiograph reviewed and Notes from prior ED visits     Daelen Belvedere was evaluated in Emergency Department on 09/10/2019 for the symptoms described in the history of present illness. He was evaluated in the context of the global COVID-19 pandemic, which necessitated consideration that the patient might be at risk for infection with the SARS-CoV-2 virus that causes COVID-19. Institutional protocols and algorithms that pertain to the evaluation of patients at risk for COVID-19 are in a state of rapid change based on information released by regulatory bodies including the CDC and federal and state organizations. These policies and algorithms were followed during the patient's care in the ED.    64 year old male who presents with dizziness for several days.   Differential diagnosis includes but is not limited to CVA, ICH, hypertension, subtherapeutic medications, ACS, etc.  Neck is supple and patient has no focal neurological deficits.  Currently voices no complaints of dizziness or headache.  Denies ever having chest pain or shortness of breath.  He has had 2 sets of negative troponins.  Mild hyponatremia noted.  Will obtain CT head, administer small IV fluid bolus and reassess.  Will refill amlodipine and lisinopril.   Clinical Course as of Sep 09 641  Wynelle Link Sep 10, 2019  3614 Updated patient on CT head result.  No complaints of dizziness or headache.  Will discharge home with refills of his blood pressure medications. Strict return precautions given. Patient verbalizes understanding and agrees with plan of care.    [JS]    Clinical Course User Index [JS] Irean Hong, MD     ____________________________________________   FINAL CLINICAL IMPRESSION(S) / ED DIAGNOSES  Final diagnoses:  Dizziness  Hyponatremia     ED Discharge Orders         Ordered    amLODipine (NORVASC) 5 MG tablet  Daily     09/10/19 0522    lisinopril (ZESTRIL) 20 MG tablet  Daily     09/10/19 0522           Note:  This document was prepared using Dragon voice recognition software and may include unintentional dictation errors.   Irean Hong, MD 09/10/19 [phone]

## 2019-09-10 NOTE — Discharge Instructions (Signed)
Please restart your blood pressure medicines which have been refilled for you.  Return to the ER for worsening symptoms, persistent vomiting, lethargy or other concerns.

## 2019-09-10 NOTE — ED Notes (Signed)
Primary rn notified of pt arrival to room.  

## 2019-10-28 ENCOUNTER — Emergency Department
Admission: EM | Admit: 2019-10-28 | Discharge: 2019-10-28 | Disposition: A | Discharge status: Home / Self Care | Payer: Medicare HMO | Attending: Emergency Medicine | Admitting: Emergency Medicine

## 2019-10-28 ENCOUNTER — Other Ambulatory Visit: Payer: Self-pay

## 2019-10-28 DIAGNOSIS — Z7982 Long term (current) use of aspirin: Secondary | ICD-10-CM | POA: Diagnosis not present

## 2019-10-28 DIAGNOSIS — J45909 Unspecified asthma, uncomplicated: Secondary | ICD-10-CM | POA: Insufficient documentation

## 2019-10-28 DIAGNOSIS — I10 Essential (primary) hypertension: Secondary | ICD-10-CM

## 2019-10-28 DIAGNOSIS — Z79899 Other long term (current) drug therapy: Secondary | ICD-10-CM | POA: Insufficient documentation

## 2019-10-28 DIAGNOSIS — I509 Heart failure, unspecified: Secondary | ICD-10-CM | POA: Insufficient documentation

## 2019-10-28 DIAGNOSIS — R519 Headache, unspecified: Secondary | ICD-10-CM | POA: Diagnosis present

## 2019-10-28 DIAGNOSIS — I110 Hypertensive heart disease with heart failure: Secondary | ICD-10-CM | POA: Insufficient documentation

## 2019-10-28 MED ORDER — AMLODIPINE BESYLATE 5 MG PO TABS: 5.0000 mg | ORAL_TABLET | Freq: Every day | ORAL | 0 refills | Status: DC

## 2019-10-28 MED ORDER — ACETAMINOPHEN 500 MG PO TABS
1000.0000 mg | ORAL_TABLET | Freq: Once | ORAL | Status: AC
  Administered 2019-10-28: 20:00:00 1000 mg via ORAL
  Filled 2019-10-28: qty 2

## 2019-10-28 MED ORDER — LISINOPRIL 20 MG PO TABS: 20.0000 mg | ORAL_TABLET | Freq: Every day | ORAL | 0 refills | Status: DC

## 2019-10-28 MED ORDER — AMLODIPINE BESYLATE 5 MG PO TABS
5.0000 mg | ORAL_TABLET | Freq: Once | ORAL | Status: AC
  Administered 2019-10-28: 5 mg via ORAL
  Filled 2019-10-28: qty 1

## 2019-10-28 NOTE — ED Notes (Signed)
Pt from home, pt states he has high bp. Pt states he took his lisinopril this am but not his other bp med. Pt in no apparent distress at present.

## 2019-10-28 NOTE — ED Triage Notes (Signed)
Pt arrives to ED c/o of HTN. States he went to an ambulance bay and BP was 190/115 and 170/102 about 30 minutes ago. Pt has hx HTN and takes 2 meds for it. Has been out of 1 medication for about 3 days.

## 2019-10-28 NOTE — ED Provider Notes (Signed)
Emergency Department Provider Note  ____________________________________________  Time seen: Approximately 8:17 PM  I have reviewed the triage vital signs and the nursing notes.   HISTORY  Chief Complaint Hypertension   Historian Patient     HPI Joel Harrell is a 64 y.o. male presents to the emergency department with hypertension noted at home.  Patient states that he has had a mild headache.  He has been off his amlodipine for the past 3 days because he ran out of his prescription.  He states that he has 1 more dose of lisinopril left at home.  He has not been able to make contact with his primary care provider.  No chest pain, chest tightness or abdominal pain.   Past Medical History:  Diagnosis Date  . Asthma   . CHF (congestive heart failure) ([place])   . Hypertension      Immunizations up to date:  Yes.     Past Medical History:  Diagnosis Date  . Asthma   . CHF (congestive heart failure) ([place])   . Hypertension     There are no problems to display for this patient.   History reviewed. No pertinent surgical history.  Prior to Admission medications   Medication Sig Start Date End Date Taking? Authorizing Provider  albuterol (PROVENTIL) (2.5 MG/3ML) 0.083% nebulizer solution Take 3 mLs (2.5 mg total) by nebulization every 6 (six) hours as needed for wheezing or shortness of breath. 06/08/19   Laban Emperor, PA-C  albuterol (VENTOLIN HFA) 108 (90 Base) MCG/ACT inhaler Inhale 2 puffs into the lungs every 6 (six) hours as needed for wheezing or shortness of breath. 06/08/19   Laban Emperor, PA-C  amLODipine (NORVASC) 5 MG tablet Take 1 tablet (5 mg total) by mouth daily. 10/28/19 11/27/19  Lannie Fields, PA-C  aspirin 81 MG EC tablet Take 81 mg by mouth daily.      [provider]  budesonide-formoterol (SYMBICORT) 160-4.5 MCG/ACT inhaler Inhale 2 puffs into the lungs 2 (two) times daily. 12/05/18   Lavonia Drafts, MD  lisinopril (ZESTRIL) 20 MG tablet  Take 1 tablet (20 mg total) by mouth daily. 10/28/19 11/27/19  Vallarie Mare M, PA-C  montelukast (SINGULAIR) 10 MG tablet Take 1 tablet (10 mg total) by mouth at bedtime. 12/28/15 12/27/16  Daymon Larsen, MD  omeprazole (PRILOSEC) 20 MG capsule Take 20 mg by mouth daily.      [provider]  predniSONE (DELTASONE) 50 MG tablet Take 1 tablet (50 mg total) by mouth daily with breakfast. 12/05/18   Lavonia Drafts, MD    Allergies Patient has no known allergies.  Family History  Problem Relation Age of Onset  . Heart attack Father   . Cancer Mother     Social History Social History   Tobacco Use  . Smoking status: Never Smoker  . Smokeless tobacco: Never Used  Substance Use Topics  . Alcohol use: Yes    Alcohol/week: 10.0 standard drinks    Types: 10 Cans of beer per week  . Drug use: No     Review of Systems  Constitutional: No fever/chills Eyes:  No discharge ENT: No upper respiratory complaints. Respiratory: no cough. No SOB/ use of accessory muscles to breath Gastrointestinal:   No nausea, no vomiting.  No diarrhea.  No constipation. Musculoskeletal: Negative for musculoskeletal pain. Skin: Negative for rash, abrasions, lacerations, ecchymosis.    ____________________________________________   PHYSICAL EXAM:  VITAL SIGNS: ED Triage Vitals [10/28/19 1725]  Enc Vitals Group  BP (!) 165/102     Pulse Rate 98     Resp 16     Temp 98.7 F (37.1 C)     Temp Source Oral     SpO2 98 %     Weight 218 lb (98.9 kg)     Height 6\' 2"  (1.88 m)     Head Circumference      Peak Flow      Pain Score 0     Pain Loc      Pain Edu?      Excl. in GC?      Constitutional: Alert and oriented. Well appearing and in no acute distress. Eyes: Conjunctivae are normal. PERRL. EOMI. Head: Atraumatic. Cardiovascular: Normal rate, regular rhythm. Normal S1 and S2.  Good peripheral circulation. Respiratory: Normal respiratory effort without tachypnea or retractions.  Lungs CTAB. Good air entry to the bases with no decreased or absent breath sounds Gastrointestinal: Bowel sounds x 4 quadrants. Soft and nontender to palpation. No guarding or rigidity. No distention. Musculoskeletal: Full range of motion to all extremities. No obvious deformities noted Neurologic:  Normal for age. No gross focal neurologic deficits are appreciated.  Skin:  Skin is warm, dry and intact. No rash noted. Psychiatric: Mood and affect are normal for age. Speech and behavior are normal.   ____________________________________________   LABS (all labs ordered are listed, but only abnormal results are displayed)  Labs Reviewed - No data to display ____________________________________________  EKG   ____________________________________________  RADIOLOGY   No results found.  ____________________________________________    PROCEDURES  Procedure(s) performed:     Procedures     Medications  amLODipine (NORVASC) tablet 5 mg (5 mg Oral Given 10/28/19 2026)  acetaminophen (TYLENOL) tablet 1,000 mg (1,000 mg Oral Given 10/28/19 2026)     ____________________________________________   INITIAL IMPRESSION / ASSESSMENT AND PLAN / ED COURSE  Pertinent labs & imaging results that were available during my care of the patient were reviewed by me and considered in my medical decision making (see chart for details).      Assessment and plan Hypertension 64 year old male presents to the emergency department with concern for hypertension that is occurred since patient stopped taking his amlodipine 3 days ago.  Patient was given amlodipine in the emergency department and his blood pressure trended down.  He had already taken his daily dose of lisinopril prior to presenting to the emergency department.  Refills of amlodipine and lisinopril were prescribed at discharge.  Return precautions were given to return with new or worsening  symptoms.    ____________________________________________  FINAL CLINICAL IMPRESSION(S) / ED DIAGNOSES  Final diagnoses:  Hypertension, unspecified type      NEW MEDICATIONS STARTED DURING THIS VISIT:  ED Discharge Orders         Ordered    lisinopril (ZESTRIL) 20 MG tablet  Daily     10/28/19 2202    amLODipine (NORVASC) 5 MG tablet  Daily     10/28/19 2202              This chart was dictated using voice recognition software/Dragon. Despite best efforts to proofread, errors can occur which can change the meaning. Any change was purely unintentional.     2203 10/28/19 2209    2210, MD 10/28/19 [phone]

## 2019-10-28 NOTE — ED Notes (Signed)
Per Dr. Scotty Court and Marja Kays PA no blood work at this time.

## 2019-11-30 ENCOUNTER — Ambulatory Visit: Payer: Medicare HMO | Attending: Internal Medicine

## 2019-11-30 ENCOUNTER — Other Ambulatory Visit: Payer: Self-pay

## 2019-11-30 DIAGNOSIS — Z23 Encounter for immunization: Secondary | ICD-10-CM

## 2019-11-30 NOTE — Progress Notes (Signed)
   Covid-19 Vaccination Clinic  Name:  Joel Harrell    MRN: [id number] DOB: [birth year]  11/30/2019  [name] was observed post Covid-19 immunization for 15 minutes without incident. He was provided with Vaccine Information Sheet and instruction to access the V-Safe system.   [name] was instructed to call 911 with any severe reactions post vaccine: Marland Kitchen Difficulty breathing  . Swelling of face and throat  . A fast heartbeat  . A bad rash all over body  . Dizziness and weakness   Immunizations Administered    Name Date Dose VIS Date Route   Moderna COVID-19 Vaccine 11/30/2019 12:50 PM 0.5 mL 06/2019 Intramuscular   Manufacturer: Moderna   Lot: [id number]   NDC: 97353-299-24

## 2019-12-06 ENCOUNTER — Emergency Department: Payer: Medicare HMO

## 2019-12-06 ENCOUNTER — Other Ambulatory Visit: Payer: Self-pay

## 2019-12-06 DIAGNOSIS — J4520 Mild intermittent asthma, uncomplicated: Secondary | ICD-10-CM | POA: Insufficient documentation

## 2019-12-06 DIAGNOSIS — Z79899 Other long term (current) drug therapy: Secondary | ICD-10-CM | POA: Insufficient documentation

## 2019-12-06 DIAGNOSIS — R0789 Other chest pain: Secondary | ICD-10-CM | POA: Diagnosis not present

## 2019-12-06 DIAGNOSIS — I509 Heart failure, unspecified: Secondary | ICD-10-CM | POA: Diagnosis not present

## 2019-12-06 DIAGNOSIS — I110 Hypertensive heart disease with heart failure: Secondary | ICD-10-CM | POA: Diagnosis not present

## 2019-12-06 DIAGNOSIS — Z7982 Long term (current) use of aspirin: Secondary | ICD-10-CM | POA: Insufficient documentation

## 2019-12-06 LAB — BASIC METABOLIC PANEL
Anion gap: 17 — ABNORMAL HIGH (ref 5–15)
BUN: 12 mg/dL (ref 8–23)
CO2: 19 mmol/L — ABNORMAL LOW (ref 22–32)
Calcium: 8.7 mg/dL — ABNORMAL LOW (ref 8.9–10.3)
Chloride: 100 mmol/L (ref 98–111)
Creatinine, Ser: 0.76 mg/dL (ref 0.61–1.24)
GFR calc Af Amer: >60 mL/min (ref >60)
GFR calc non Af Amer: >60 mL/min (ref >60)
Glucose, Bld: 121 mg/dL — ABNORMAL HIGH (ref 70–99)
Potassium: 3.5 mmol/L (ref 3.5–5.1)
Sodium: 136 mmol/L (ref 135–145)

## 2019-12-06 LAB — CBC
HCT: 44.4 % (ref 39.0–52.0)
Hemoglobin: 15.8 g/dL (ref 13.0–17.0)
MCH: 32.8 pg (ref 26.0–34.0)
MCHC: 35.6 g/dL (ref 30.0–36.0)
MCV: 92.3 fL (ref 80.0–100.0)
Platelets: 155 10*3/uL (ref 150–400)
RBC: 4.81 MIL/uL (ref 4.22–5.81)
RDW: 12 % (ref 11.5–15.5)
WBC: 4.2 10*3/uL (ref 4.0–10.5)
nRBC: 0 % (ref 0.0–0.2)

## 2019-12-06 LAB — TROPONIN I (HIGH SENSITIVITY)
Troponin I (High Sensitivity): 8 ng/L (ref <18)
Troponin I (High Sensitivity): 7 ng/L (ref <18)

## 2019-12-06 MED ORDER — SODIUM CHLORIDE 0.9% FLUSH: 3.0000 mL | Freq: Once | INTRAVENOUS | Status: DC

## 2019-12-06 NOTE — ED Triage Notes (Addendum)
Pt comes via POV from home with c/o CP and SOB that started early this am. Pt states right sided chest pain. Pt states no radiation but some numbness to hands.  Pt denies any N/V/D. Pt states he does feel lightheaded.  Pt shaking and states when he gets SOB and dizzy this happens.

## 2019-12-07 ENCOUNTER — Emergency Department
Admission: EM | Admit: 2019-12-07 | Discharge: 2019-12-07 | Disposition: A | Discharge status: Home / Self Care | Payer: Medicare HMO | Attending: Emergency Medicine | Admitting: Emergency Medicine

## 2019-12-07 DIAGNOSIS — J4520 Mild intermittent asthma, uncomplicated: Secondary | ICD-10-CM

## 2019-12-07 DIAGNOSIS — I10 Essential (primary) hypertension: Secondary | ICD-10-CM

## 2019-12-07 DIAGNOSIS — R079 Chest pain, unspecified: Secondary | ICD-10-CM

## 2019-12-07 MED ORDER — AMLODIPINE BESYLATE 5 MG PO TABS: 5.0000 mg | ORAL_TABLET | Freq: Every day | ORAL | 1 refills | Status: DC

## 2019-12-07 MED ORDER — ALBUTEROL SULFATE (2.5 MG/3ML) 0.083% IN NEBU: 2.5000 mg | INHALATION_SOLUTION | Freq: Four times a day (QID) | RESPIRATORY_TRACT | 2 refills | Status: DC | PRN

## 2019-12-07 MED ORDER — LISINOPRIL 20 MG PO TABS: 20.0000 mg | ORAL_TABLET | Freq: Every day | ORAL | 1 refills | Status: DC

## 2019-12-07 MED ORDER — ALBUTEROL SULFATE HFA 108 (90 BASE) MCG/ACT IN AERS: 2.0000 | INHALATION_SPRAY | Freq: Four times a day (QID) | RESPIRATORY_TRACT | 2 refills | Status: DC | PRN

## 2019-12-07 MED ORDER — DEXAMETHASONE 10 MG/ML FOR PEDIATRIC ORAL USE
10.0000 mg | Freq: Once | INTRAMUSCULAR | Status: AC
  Administered 2019-12-07: 10 mg via ORAL
  Filled 2019-12-07: qty 1

## 2019-12-07 NOTE — Discharge Instructions (Signed)

## 2019-12-07 NOTE — ED Provider Notes (Addendum)
[facility] Emergency Department Provider Note  ____________________________________________   First MD Initiated Contact with Patient 12/07/19 0120     (approximate)  I have reviewed the triage vital signs and the nursing notes.   HISTORY  Chief Complaint Chest Pain and Shortness of Breath    HPI Joel Harrell is a 64 y.o. male with medical history as listed below who has had 4 other visits to the emergency department for similar symptoms in the last 6 months.  He presents tonight for evaluation of about 24 hours of intermittent right-sided sharp chest pain that is not well localized.  He also feels little bit wheezy, primarily expiratory, and says this feels consistent with his normal asthma.  He still does not have a primary care doctor and says he has been working with [facility] for about a month to get one.  Each time he has been in the emergency department previously it was recommended to him that he go see a cardiologist and he was given follow-up information, but he admits that he has not done so.  He ran out of his blood pressure medicine (lisinopril and amlodipine) little more than a week ago after his last ED visit about 6 weeks ago when they were refilled.  He also says that he is out of both his albuterol inhaler and his albuterol nebulizer solution.  Nothing in particular makes his symptoms better or worse.  He is occasionally dizzy or lightheaded when he has the symptoms and said this is normal for him.  He has not had any recent injury.  No history of blood clots in the legs of the lungs and he has been evaluated previously for both DVT and PE with negative work-ups.  No recent immobilizations or surgeries.  No history of active cancer.  He denies fever/chills, sore throat, nausea, vomiting, abdominal pain, and dysuria.   Occasionally he has some numbness in his hands associated with the symptoms but this also is not new for him.  He has no focal weakness, no  difficulty with ambulation, no syncope.        Past Medical History:  Diagnosis Date  . Asthma   . CHF (congestive heart failure) ([facility])   . Hypertension     There are no problems to display for this patient.   History reviewed. No pertinent surgical history.  Prior to Admission medications   Medication Sig Start Date End Date Taking? Authorizing Provider  albuterol (PROVENTIL) (2.5 MG/3ML) 0.083% nebulizer solution Take 3 mLs (2.5 mg total) by nebulization every 6 (six) hours as needed for wheezing or shortness of breath. 12/07/19   Loleta Rose, MD  albuterol (VENTOLIN HFA) 108 (90 Base) MCG/ACT inhaler Inhale 2 puffs into the lungs every 6 (six) hours as needed for wheezing or shortness of breath. 12/07/19   Loleta Rose, MD  amLODipine (NORVASC) 5 MG tablet Take 1 tablet (5 mg total) by mouth daily. 12/07/19 06/04/20  Loleta Rose, MD  aspirin 81 MG EC tablet Take 81 mg by mouth daily.      [provider]  budesonide-formoterol (SYMBICORT) 160-4.5 MCG/ACT inhaler Inhale 2 puffs into the lungs 2 (two) times daily. 12/05/18   Jene Every, MD  lisinopril (ZESTRIL) 20 MG tablet Take 1 tablet (20 mg total) by mouth daily. 12/07/19 06/04/20  Loleta Rose, MD  montelukast (SINGULAIR) 10 MG tablet Take 1 tablet (10 mg total) by mouth at bedtime. 12/28/15 12/27/16  Jennye Moccasin, MD  omeprazole (PRILOSEC) 20  MG capsule Take 20 mg by mouth daily.      [provider]  predniSONE (DELTASONE) 50 MG tablet Take 1 tablet (50 mg total) by mouth daily with breakfast. 12/05/18   Jene Every, MD    Allergies Patient has no known allergies.  Family History  Problem Relation Age of Onset  . Heart attack Father   . Cancer Mother     Social History Social History   Tobacco Use  . Smoking status: Never Smoker  . Smokeless tobacco: Never Used  Substance Use Topics  . Alcohol use: Yes    Alcohol/week: 10.0 standard drinks    Types: 10 Cans of beer per week  . Drug  use: No    Review of Systems Constitutional: No fever/chills Eyes: No visual changes. ENT: No sore throat. Cardiovascular: +chest pain. Respiratory: Wheezing, no severe shortness of breath, occasional cough. Gastrointestinal: No abdominal pain.  No nausea, no vomiting.  No diarrhea.  No constipation. Genitourinary: Negative for dysuria. Musculoskeletal: Negative for neck pain.  Negative for back pain. Integumentary: Negative for rash. Neurological: Negative for headaches, focal weakness or numbness.   ____________________________________________   PHYSICAL EXAM:  VITAL SIGNS: ED Triage Vitals  Enc Vitals Group     BP 12/06/19 1435 (!) 153/80     Pulse Rate 12/06/19 1435 (!) 128     Resp 12/06/19 1435 (!) 21     Temp 12/06/19 1435 98.1 F (36.7 C)     Temp Source 12/06/19 1645 Oral     SpO2 12/06/19 1435 99 %     Weight 12/06/19 1438 99.8 kg (220 lb)     Height 12/06/19 1438 1.905 m (6\' 3" )     Head Circumference --      Peak Flow --      Pain Score 12/06/19 1438 10     Pain Loc --      Pain Edu? --      Excl. in GC? --     Constitutional: Alert and oriented.  Eyes: Conjunctivae are normal.  Head: Atraumatic. Nose: No congestion/rhinnorhea. Mouth/Throat: Patient is wearing a mask. Neck: No stridor.  No meningeal signs.   Cardiovascular: Mild tachycardia, regular rhythm. Good peripheral circulation. Grossly normal heart sounds. Respiratory: Normal respiratory effort.  No retractions.  Mild end expiratory wheezing throughout. Gastrointestinal: Soft and nontender. No distention.  Musculoskeletal: No lower extremity tenderness nor edema. No gross deformities of extremities. Neurologic:  Normal speech and language. No gross focal neurologic deficits are appreciated.  Skin:  Skin is warm, dry and intact. Psychiatric: Mood and affect are normal. Speech and behavior are normal.  ____________________________________________   LABS (all labs ordered are listed, but only  abnormal results are displayed)  Labs Reviewed  BASIC METABOLIC PANEL - Abnormal; Notable for the following components:      Result Value   CO2 19 (*)    Glucose, Bld 121 (*)    Calcium 8.7 (*)    Anion gap 17 (*)    All other components within normal limits  CBC  TROPONIN I (HIGH SENSITIVITY)  TROPONIN I (HIGH SENSITIVITY)   ____________________________________________  EKG  ED ECG REPORT I, 12/08/19, the attending physician, personally viewed and interpreted this ECG.  Date: 12/06/2019 EKG Time: 14: 33 Rate: 128 Rhythm: Sinus tachycardia QRS Axis: normal Intervals: normal ST/T Wave abnormalities: Non-specific ST segment / T-wave changes, but no clear evidence of acute ischemia. Narrative Interpretation: no definitive evidence of acute ischemia; does not meet STEMI criteria.  ____________________________________________  RADIOLOGY Ursula Alert, personally viewed and evaluated these images (plain radiographs) as part of my medical decision making, as well as reviewing the written report by the radiologist.  ED MD interpretation: No acute abnormalities  Official radiology report(s): DG Chest 2 View  Result Date: 12/06/2019 CLINICAL DATA:  Chest pain and shortness of breath EXAM: CHEST - 2 VIEW COMPARISON:  04/20/2019 FINDINGS: The heart size and mediastinal contours are within normal limits. Both lungs are clear. The visualized skeletal structures are unremarkable. IMPRESSION: No active cardiopulmonary disease. Electronically Signed   By: Inez Catalina M.D.   On: 12/06/2019 15:22    ____________________________________________   PROCEDURES   Procedure(s) performed (including Critical Care):  .1-3 Lead EKG Interpretation Performed by: Hinda Kehr, MD Authorized by: Hinda Kehr, MD     Interpretation: abnormal     ECG rate:  101   ECG rate assessment: normal     Rhythm: sinus tachycardia     Ectopy: none     Conduction: normal        ____________________________________________   INITIAL IMPRESSION / MDM / ASSESSMENT AND PLAN / ED COURSE  As part of my medical decision making, I reviewed the following data within the [place] notes reviewed and incorporated, Labs reviewed , EKG interpreted , Old chart reviewed, Radiograph reviewed  and Notes from prior ED visits   Differential diagnosis includes, but is not limited to, angina, ACS, PE, pneumothorax, asthma exacerbation, CHF.  The patient was on the cardiac monitor to evaluate for evidence of arrhythmia and/or significant heart rate changes.  The patient has had similar symptoms in the past and has been evaluated with D-dimers, DVT studies, etc.  His work-up tonight is reassuring.  He remains very mildly tachycardic even though he was more tachycardic in triage (although his most recent heart rate was 97).  He says he feels better now after an 11-hour wait in the emergency department due to overwhelming patient volume and a full hospital.  He is laughing and joking with me and admits that he should be seeing a doctor as an outpatient and promises he will do so.  His basic metabolic panel, CBC, and 2 high-sensitivity troponins are all reassuring.  Normal chest x-ray, tachycardia but with no sign of ischemia on his EKG.  Given that his well score for PE is currently 0, and 1.5 when he is tachycardiac even though it is intermittent, he has been evaluated in the past couple of months with a negative D-dimer.  At this point I do not think he would benefit from additional evaluation.  He is very comfortable with the plan to refill his medications as listed below including his 2 prescriptions for albuterol, amlodipine, and lisinopril.  He promises to follow-up with a cardiologist as well as to get established with a primary care doctor and he knows to come back to the ED if his symptoms worsen.  He is in no distress at this time with only mild expiratory  wheezing for which I am giving him a dose of Decadron 10 mg by mouth rather than a prescription for prednisone.  He is happy with the plan and will return as needed.         ____________________________________________  FINAL CLINICAL IMPRESSION(S) / ED DIAGNOSES  Final diagnoses:  Chest pain, unspecified type  Mild intermittent asthma, unspecified whether complicated  Essential hypertension     MEDICATIONS GIVEN DURING THIS VISIT:  Medications  sodium chloride flush (  NS) 0.9 % injection 3 mL (has no administration in time range)  dexamethasone (DECADRON) 10 MG/ML injection for Pediatric ORAL use 10 mg (10 mg Oral Given 12/07/19 0136)     ED Discharge Orders         Ordered    albuterol (PROVENTIL) (2.5 MG/3ML) 0.083% nebulizer solution  Every 6 hours PRN     12/07/19 0132    albuterol (VENTOLIN HFA) 108 (90 Base) MCG/ACT inhaler  Every 6 hours PRN     12/07/19 0132    amLODipine (NORVASC) 5 MG tablet  Daily     12/07/19 0132    lisinopril (ZESTRIL) 20 MG tablet  Daily     12/07/19 0132          *Please note:  Adekunle Rohrbach was evaluated in Emergency Department on 12/07/2019 for the symptoms described in the history of present illness. He was evaluated in the context of the global COVID-19 pandemic, which necessitated consideration that the patient might be at risk for infection with the SARS-CoV-2 virus that causes COVID-19. Institutional protocols and algorithms that pertain to the evaluation of patients at risk for COVID-19 are in a state of rapid change based on information released by regulatory bodies including the CDC and federal and state organizations. These policies and algorithms were followed during the patient's care in the ED.  Some ED evaluations and interventions may be delayed as a result of limited staffing during the pandemic.*  Note:  This document was prepared using Dragon voice recognition software and may include unintentional dictation errors.    Loleta Rose, MD 12/07/19 1610    Loleta Rose, MD 12/07/19 [phone]

## 2019-12-28 ENCOUNTER — Ambulatory Visit: Payer: Medicare HMO | Attending: Internal Medicine

## 2019-12-28 DIAGNOSIS — Z23 Encounter for immunization: Secondary | ICD-10-CM

## 2019-12-28 NOTE — Progress Notes (Signed)
   Covid-19 Vaccination Clinic  Name:  Joel Harrell    MRN: [id number] DOB: September 16, 1955  12/28/2019  Joel Harrell was observed post Covid-19 immunization for 15 minutes without incident. He was provided with Vaccine Information Sheet and instruction to access the V-Safe system.   Joel Harrell was instructed to call 911 with any severe reactions post vaccine: Marland Kitchen Difficulty breathing  . Swelling of face and throat  . A fast heartbeat  . A bad rash all over body  . Dizziness and weakness   Immunizations Administered    Name Date Dose VIS Date Route   Moderna COVID-19 Vaccine 12/28/2019 12:48 PM 0.5 mL 06/2019 Intramuscular   Manufacturer: Moderna   Lot: [id number]   NDC: 25750-518-33

## 2021-04-20 IMAGING — DX PORTABLE CHEST - 1 VIEW
1 series · 1 of 1 positions shown · non-contrast
Comparison: 03/17/2018

CLINICAL DATA: Chest pain and shortness of breath

EXAM:
PORTABLE CHEST 1 VIEW

[chest ap]
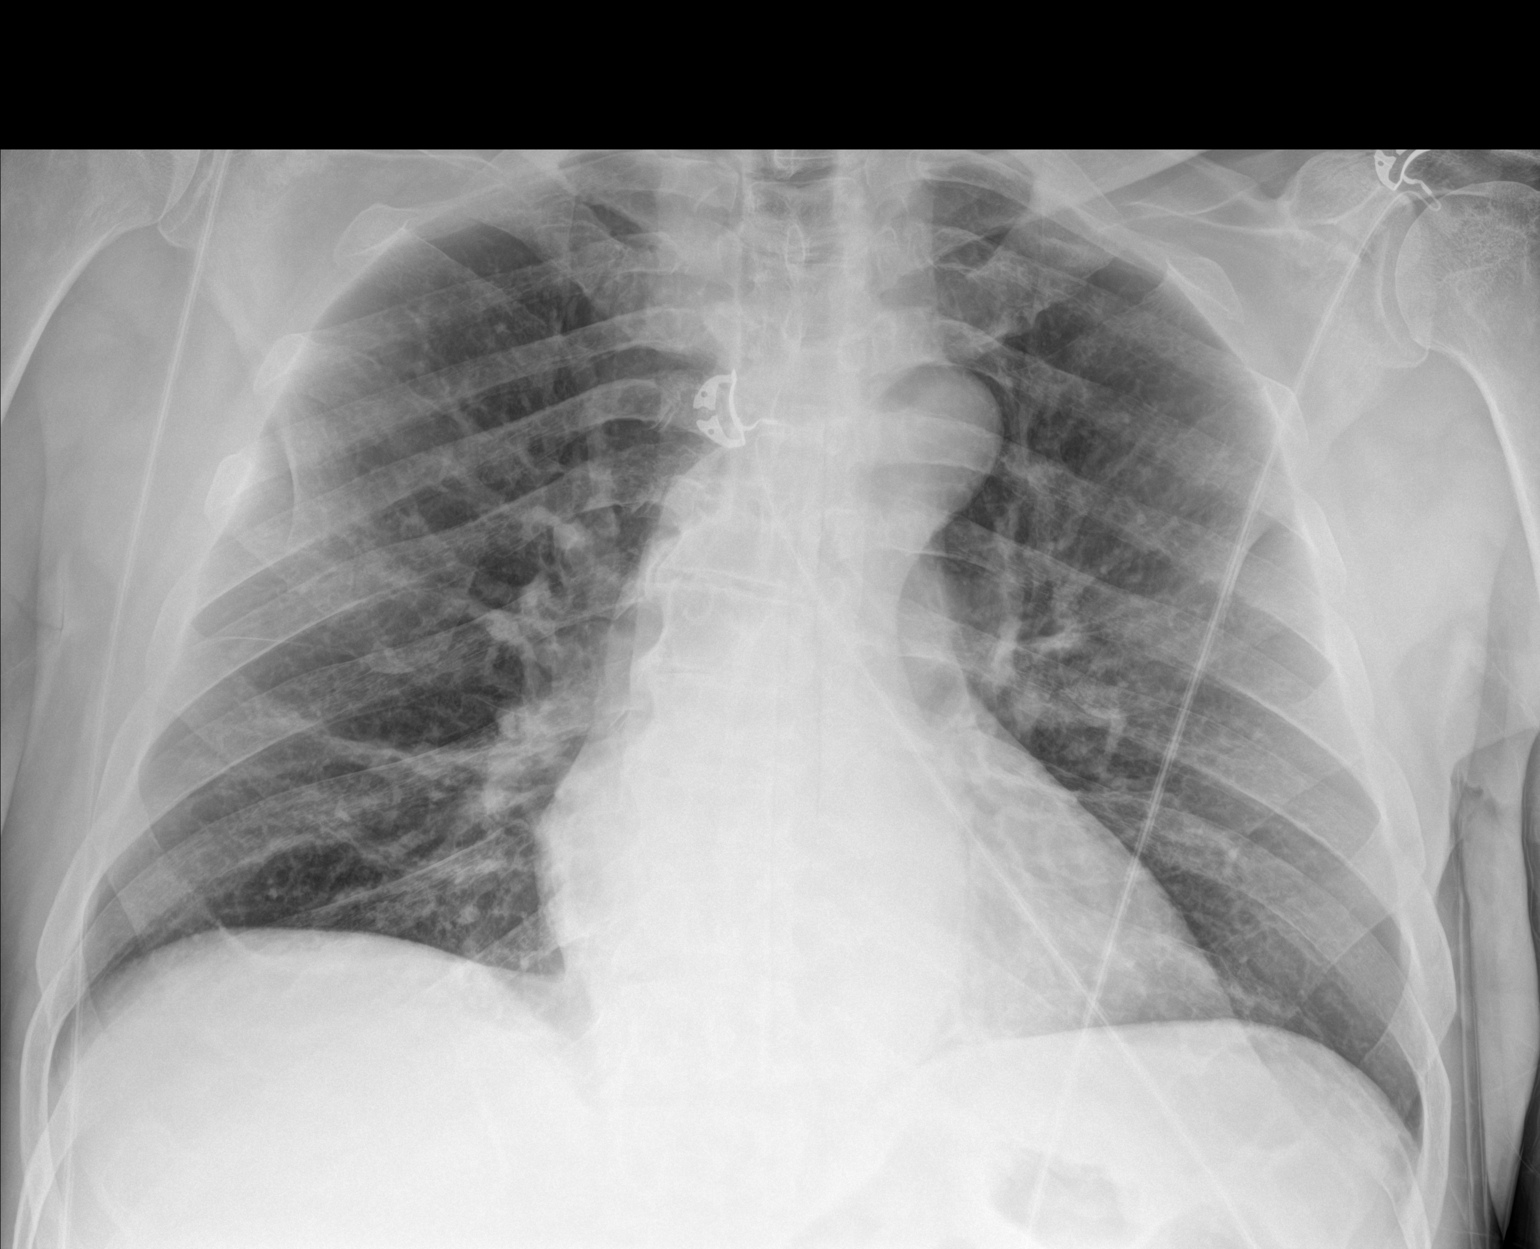

[1 of 1 positions shown; findings below may reference images not displayed]

FINDINGS: The heart size and mediastinal contours are within normal limits.
Both lungs are clear. The visualized skeletal structures are
unremarkable.
IMPRESSION: No active disease.

## 2022-01-24 IMAGING — CT CT HEAD W/O CM
3 series · 16 of 47 positions shown, 19 images · non-contrast
Comparison: 01/01/2013

CLINICAL DATA: Acute presentation with dizziness over the last 2
days.

EXAM:
CT HEAD WITHOUT CONTRAST
TECHNIQUE: Contiguous axial images were obtained from the base of the skull
through the vertex without intravenous contrast.

[Series 2: head wo · axial · 0.43mm/px · z∈[-72,+58]mm · 10 of 32 slices shown, 13 images]
[im 3/32  brain]
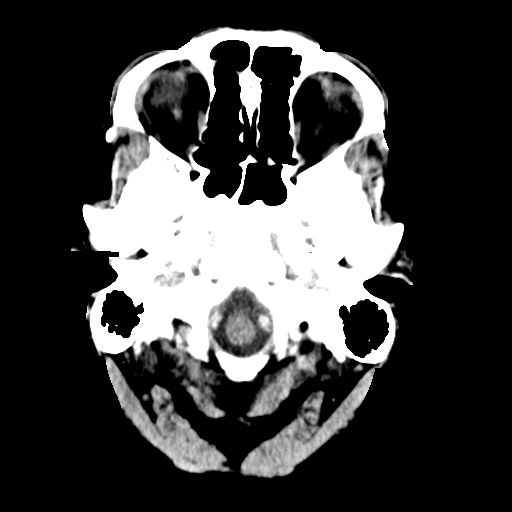
[im 3/32  bone]
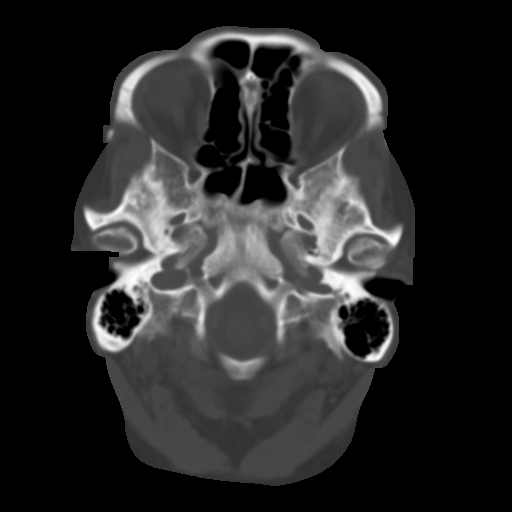
[im 6/32  brain]
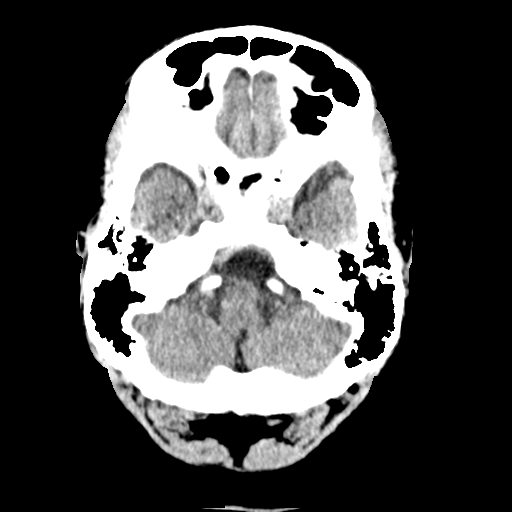
[im 9/32  brain]
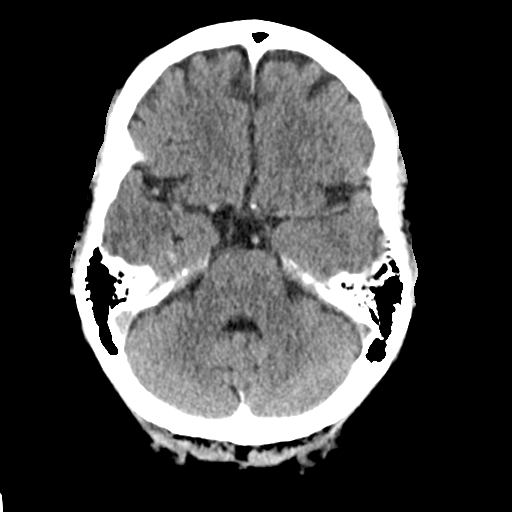
[im 11/32  brain]
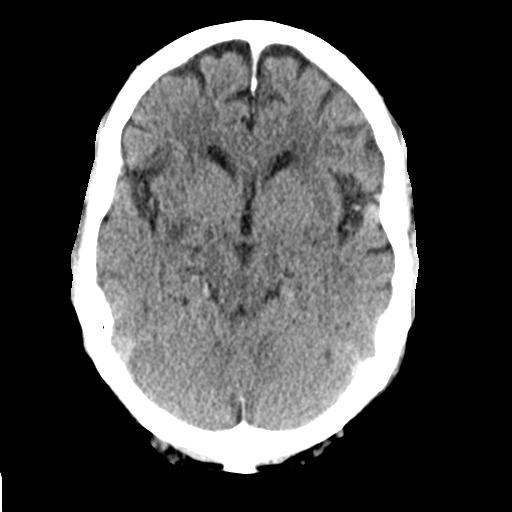
[im 14/32  brain]
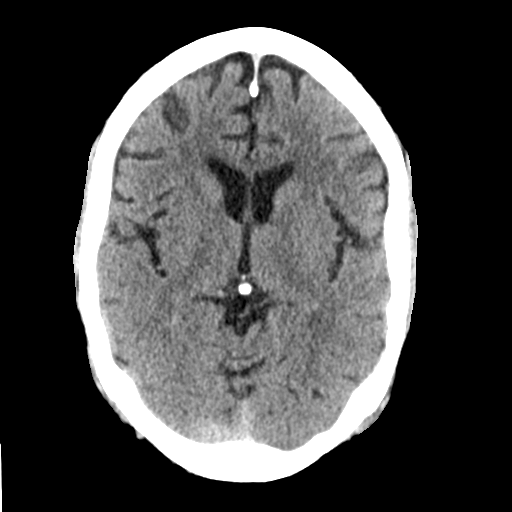
[im 14/32  bone]
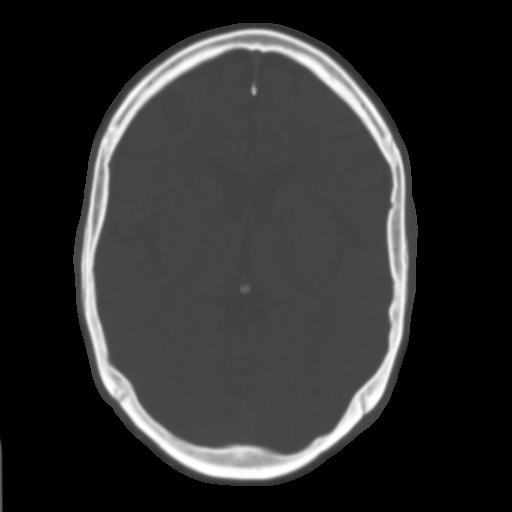
[im 18/32  brain]
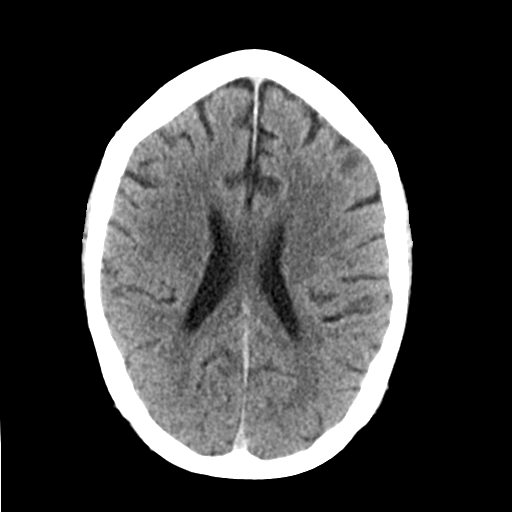
[im 21/32  brain]
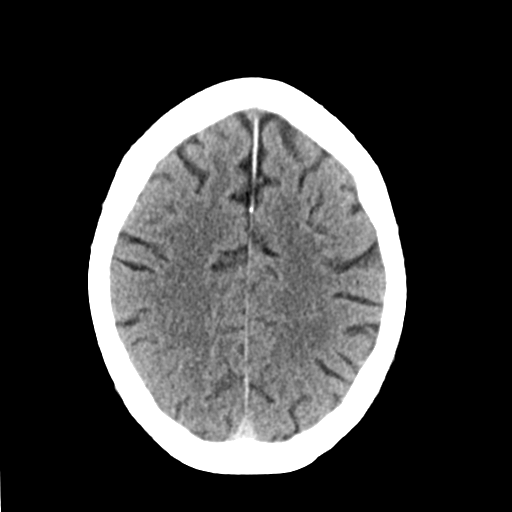
[im 24/32  brain]
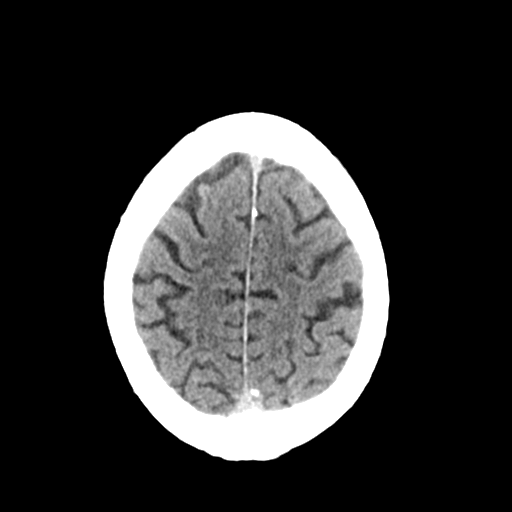
[im 26/32  brain]
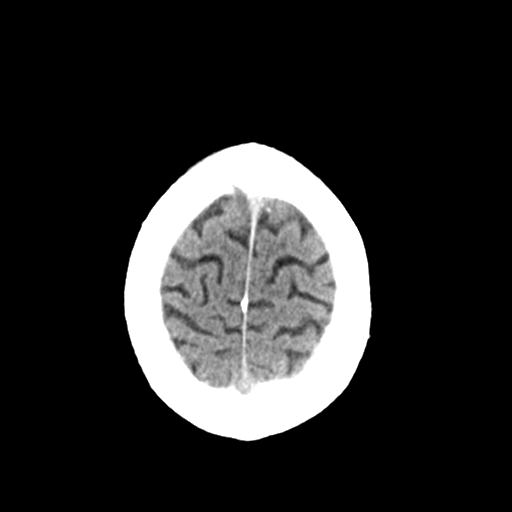
[im 26/32  bone]
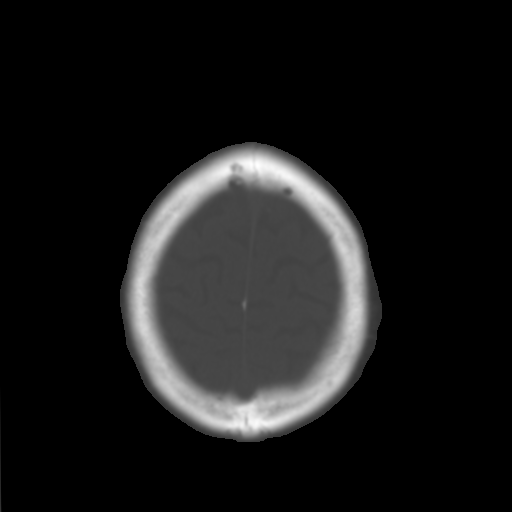
[im 29/32  brain]
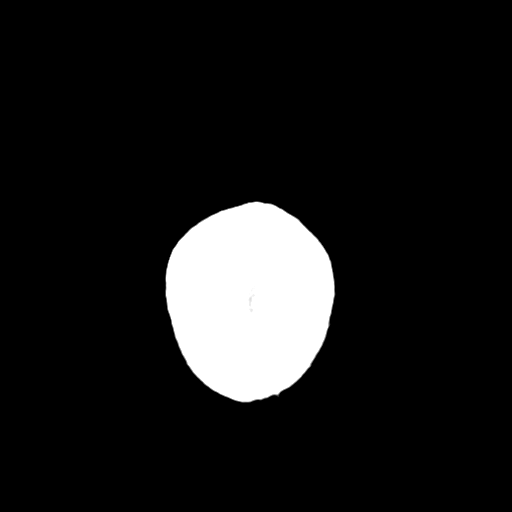

[Series 4: coronal soft tissue · coronal · 0.30mm/px · 3 of 69 slices shown]
[im 23/69  brain]
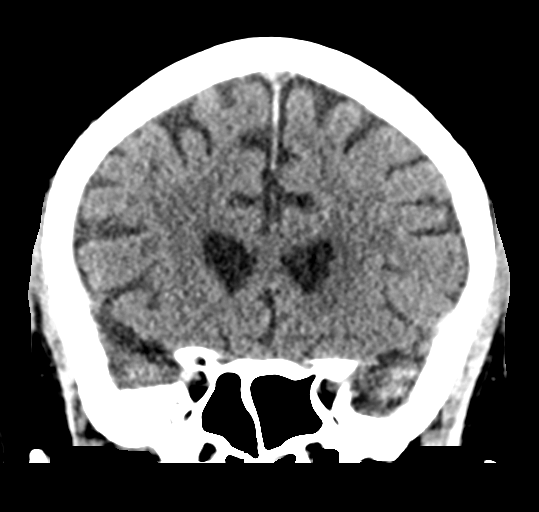
[im 31/69  brain]
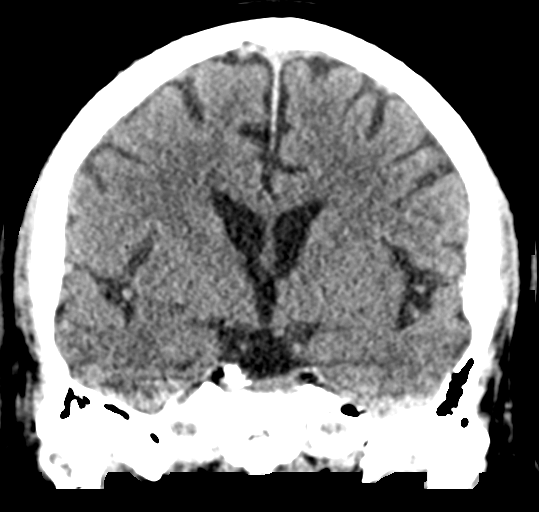
[im 38/69  brain]
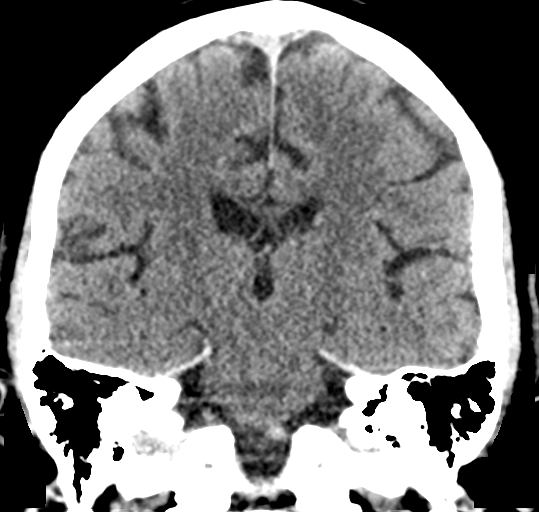

[Series 5: sagittal soft tissue · sagittal · 0.33mm/px · 3 of 52 slices shown]
[im 18/52  brain]
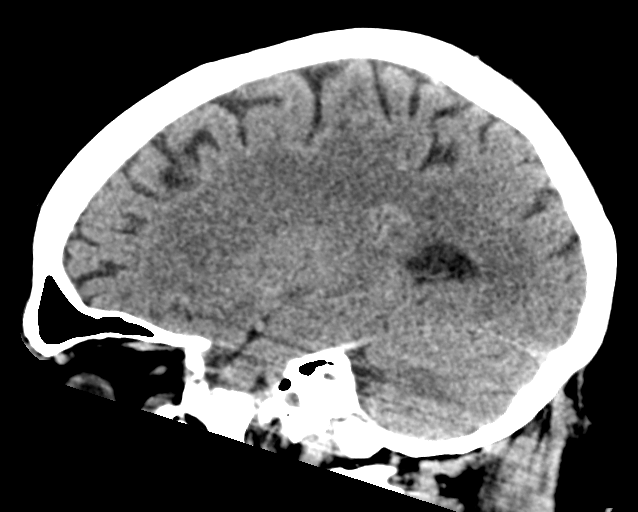
[im 26/52  brain]
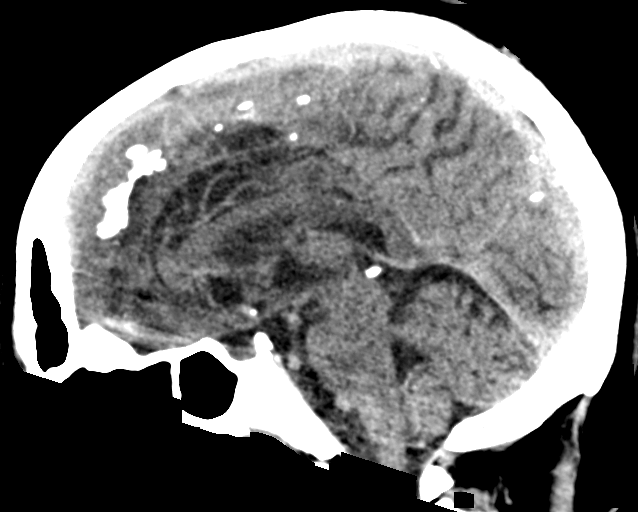
[im 35/52  brain]
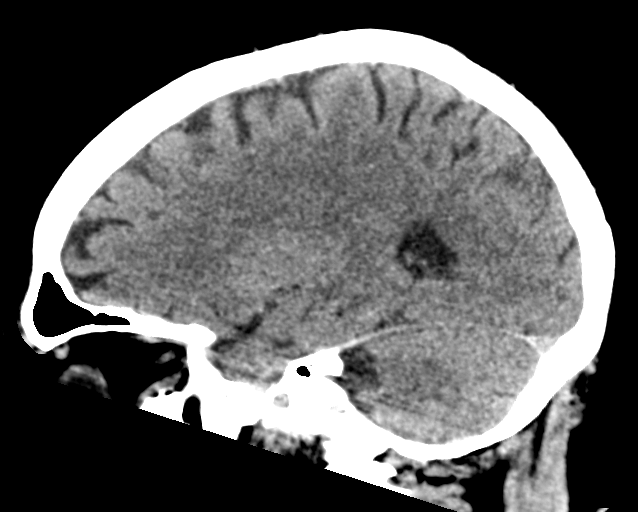

[16 of 47 positions shown; findings below may reference images not displayed]

FINDINGS: Brain: The brain shows a normal appearance without evidence of
malformation, atrophy, old or acute small or large vessel
infarction, mass lesion, hemorrhage, hydrocephalus or extra-axial
collection.

Vascular: No hyperdense vessel. No evidence of atherosclerotic
calcification.

Skull: Normal.  No traumatic finding.  No focal bone lesion.

Sinuses/Orbits: Sinuses are clear. Orbits appear normal. Mastoids
are clear.

Other: None significant
IMPRESSION: Normal head CT.

## 2022-09-10 DIAGNOSIS — R69 Illness, unspecified: Secondary | ICD-10-CM | POA: Diagnosis not present

## 2022-11-21 ENCOUNTER — Emergency Department: Payer: Medicare HMO

## 2022-11-21 ENCOUNTER — Other Ambulatory Visit: Payer: Self-pay

## 2022-11-21 ENCOUNTER — Inpatient Hospital Stay
Admission: EM | Admit: 2022-11-21 | Discharge: 2022-11-23 | DRG: 442 | Disposition: A | Discharge status: Home / Self Care | Payer: Medicare HMO | Attending: Family Medicine | Admitting: Family Medicine

## 2022-11-21 DIAGNOSIS — J4489 Other specified chronic obstructive pulmonary disease: Secondary | ICD-10-CM | POA: Diagnosis present

## 2022-11-21 DIAGNOSIS — D500 Iron deficiency anemia secondary to blood loss (chronic): Secondary | ICD-10-CM

## 2022-11-21 DIAGNOSIS — Z7982 Long term (current) use of aspirin: Secondary | ICD-10-CM

## 2022-11-21 DIAGNOSIS — K649 Unspecified hemorrhoids: Secondary | ICD-10-CM | POA: Diagnosis present

## 2022-11-21 DIAGNOSIS — K921 Melena: Secondary | ICD-10-CM | POA: Diagnosis present

## 2022-11-21 DIAGNOSIS — K922 Gastrointestinal hemorrhage, unspecified: Secondary | ICD-10-CM

## 2022-11-21 DIAGNOSIS — D61818 Other pancytopenia: Secondary | ICD-10-CM | POA: Diagnosis present

## 2022-11-21 DIAGNOSIS — K7030 Alcoholic cirrhosis of liver without ascites: Secondary | ICD-10-CM | POA: Diagnosis present

## 2022-11-21 DIAGNOSIS — F1010 Alcohol abuse, uncomplicated: Secondary | ICD-10-CM | POA: Diagnosis present

## 2022-11-21 DIAGNOSIS — Z8249 Family history of ischemic heart disease and other diseases of the circulatory system: Secondary | ICD-10-CM

## 2022-11-21 DIAGNOSIS — K7290 Hepatic failure, unspecified without coma: Secondary | ICD-10-CM

## 2022-11-21 DIAGNOSIS — Z79899 Other long term (current) drug therapy: Secondary | ICD-10-CM

## 2022-11-21 DIAGNOSIS — R296 Repeated falls: Secondary | ICD-10-CM | POA: Diagnosis present

## 2022-11-21 DIAGNOSIS — K7682 Hepatic encephalopathy: Principal | ICD-10-CM | POA: Diagnosis present

## 2022-11-21 DIAGNOSIS — Z7951 Long term (current) use of inhaled steroids: Secondary | ICD-10-CM

## 2022-11-21 DIAGNOSIS — I509 Heart failure, unspecified: Secondary | ICD-10-CM | POA: Diagnosis present

## 2022-11-21 DIAGNOSIS — Z7952 Long term (current) use of systemic steroids: Secondary | ICD-10-CM

## 2022-11-21 DIAGNOSIS — R791 Abnormal coagulation profile: Secondary | ICD-10-CM | POA: Diagnosis present

## 2022-11-21 DIAGNOSIS — F1090 Alcohol use, unspecified, uncomplicated: Secondary | ICD-10-CM

## 2022-11-21 DIAGNOSIS — J449 Chronic obstructive pulmonary disease, unspecified: Secondary | ICD-10-CM | POA: Insufficient documentation

## 2022-11-21 DIAGNOSIS — I110 Hypertensive heart disease with heart failure: Secondary | ICD-10-CM | POA: Diagnosis present

## 2022-11-21 DIAGNOSIS — I10 Essential (primary) hypertension: Secondary | ICD-10-CM | POA: Insufficient documentation

## 2022-11-21 LAB — CBC
HCT: 27.5 % — ABNORMAL LOW (ref 39.0–52.0)
Hemoglobin: 8.3 g/dL — ABNORMAL LOW (ref 13.0–17.0)
MCH: 22 pg — ABNORMAL LOW (ref 26.0–34.0)
MCHC: 30.2 g/dL (ref 30.0–36.0)
MCV: 72.9 fL — ABNORMAL LOW (ref 80.0–100.0)
Platelets: 145 10*3/uL — ABNORMAL LOW (ref 150–400)
RBC: 3.77 MIL/uL — ABNORMAL LOW (ref 4.22–5.81)
RDW: 19.6 % — ABNORMAL HIGH (ref 11.5–15.5)
WBC: 3.7 10*3/uL — ABNORMAL LOW (ref 4.0–10.5)
nRBC: 0 % (ref 0.0–0.2)

## 2022-11-21 LAB — DIFFERENTIAL
Abs Immature Granulocytes: 0 10*3/uL (ref 0.00–0.07)
Basophils Absolute: 0.1 10*3/uL (ref 0.0–0.1)
Basophils Relative: 2 %
Eosinophils Absolute: 0.1 10*3/uL (ref 0.0–0.5)
Eosinophils Relative: 2 %
Immature Granulocytes: 0 %
Lymphocytes Relative: 59 %
Lymphs Abs: 2.2 10*3/uL (ref 0.7–4.0)
Monocytes Absolute: 0.2 10*3/uL (ref 0.1–1.0)
Monocytes Relative: 6 %
Neutro Abs: 1.1 10*3/uL — ABNORMAL LOW (ref 1.7–7.7)
Neutrophils Relative %: 31 %

## 2022-11-21 LAB — COMPREHENSIVE METABOLIC PANEL
ALT: 54 U/L — ABNORMAL HIGH (ref 0–44)
AST: 199 U/L — ABNORMAL HIGH (ref 15–41)
Albumin: 3.4 g/dL — ABNORMAL LOW (ref 3.5–5.0)
Alkaline Phosphatase: 127 U/L — ABNORMAL HIGH (ref 38–126)
Anion gap: 7 (ref 5–15)
BUN: 8 mg/dL (ref 8–23)
CO2: 21 mmol/L — ABNORMAL LOW (ref 22–32)
Calcium: 7.5 mg/dL — ABNORMAL LOW (ref 8.9–10.3)
Chloride: 109 mmol/L (ref 98–111)
Creatinine, Ser: 0.67 mg/dL (ref 0.61–1.24)
GFR, Estimated: >60 mL/min (ref >60)
Glucose, Bld: 102 mg/dL — ABNORMAL HIGH (ref 70–99)
Potassium: 3.5 mmol/L (ref 3.5–5.1)
Sodium: 137 mmol/L (ref 135–145)
Total Bilirubin: 0.9 mg/dL (ref 0.3–1.2)
Total Protein: 9.1 g/dL — ABNORMAL HIGH (ref 6.5–8.1)

## 2022-11-21 LAB — PROTIME-INR
INR: 1.3 — ABNORMAL HIGH (ref 0.8–1.2)
Prothrombin Time: 16.7 seconds — ABNORMAL HIGH (ref 11.4–15.2)

## 2022-11-21 LAB — CBG MONITORING, ED: Glucose-Capillary: 99 mg/dL (ref 70–99)

## 2022-11-21 LAB — APTT: aPTT: 32 seconds (ref 24–36)

## 2022-11-21 LAB — TROPONIN I (HIGH SENSITIVITY): Troponin I (High Sensitivity): 7 ng/L (ref <18)

## 2022-11-21 MED ORDER — PANTOPRAZOLE SODIUM 40 MG IV SOLR
40.0000 mg | Freq: Once | INTRAVENOUS | Status: AC
  Administered 2022-11-22: 40 mg via INTRAVENOUS
  Filled 2022-11-21: qty 10

## 2022-11-21 MED ORDER — SODIUM CHLORIDE 0.9% FLUSH
3.0000 mL | Freq: Once | INTRAVENOUS | Status: AC
  Administered 2022-11-21: 3 mL via INTRAVENOUS

## 2022-11-21 NOTE — ED Triage Notes (Signed)
Pt via POV from home. Pt is accompanied by wife, per wife pt was asleep when she left to run errands with morning. A neighbor went to check on pt at 1:00pm but pt had fell, stated that he was normal but tired. Neighbor stated that he came back 30 mins later and patient had fallen again and and was more confused and lethargic. States that approx 1 hour ago pt had some slurred speech and increased confusion. No facial droop. No numbness. No weakness. Pt is alert but disoriented x4.

## 2022-11-21 NOTE — ED Provider Notes (Signed)
[facility] Provider Note    Event Date/Time   First MD Initiated Contact with Patient 11/21/22 2334     (approximate)   History   Altered Mental Status   HPI  Joel Harrell is a 67 y.o. male who presents to the ED for evaluation of Altered Mental Status   Patient self-reports a history of hypertension on lisinopril and amlodipine.  ASA 81 is his only thinner.  No other reported medical history or prescription medications.  He presents to the ED after an episode of dizziness today and presyncope.  He reports an episode of dizziness when he got up to go to the bathroom, slipping on a tile floor in his socks but catching himself.  No fall to the ground or full syncope.  He reports feeling better now in the ED and has no complaints.  He reports a few weeks, about 1 month, of melanotic stools without other bleeding diatheses.  Reports about 3 days of atraumatic lower extremity bilateral swelling.   No changes to p.o. intake, emesis, abdominal pain, chest pain, dyspnea, fevers or other concerns.  He reports drinking about 1 beer and 1 shot of alcohol per day.   Physical Exam   Triage Vital Signs: ED Triage Vitals [11/21/22 1838]  Enc Vitals Group     BP 122/78     Pulse Rate 84     Resp 18     Temp 98.3 F (36.8 C)     Temp Source Oral     SpO2 98 %     Weight      Height      Head Circumference      Peak Flow      Pain Score 0     Pain Loc      Pain Edu?      Excl. in GC?     Most recent vital signs: Vitals:   11/21/22 1838 11/21/22 2245  BP: 122/78 138/78  Pulse: 84 87  Resp: 18 17  Temp: 98.3 F (36.8 C) 97.8 F (36.6 C)  SpO2: 98% 98%    General: Awake, no distress.  Oriented, pleasant and conversational.  Well-appearing. CV:  Good peripheral perfusion.  Resp:  Normal effort.  Abd:  No distention.  Soft and benign throughout.  No RUQ tenderness. MSK:  Bilateral symmetric lower extremity swelling is noted Neuro:  No focal  deficits appreciated. Other:     ED Results / Procedures / Treatments   Labs (all labs ordered are listed, but only abnormal results are displayed) Labs Reviewed  COMPREHENSIVE METABOLIC PANEL - Abnormal; Notable for the following components:      Result Value   CO2 21 (*)    Glucose, Bld 102 (*)    Calcium 7.5 (*)    Total Protein 9.1 (*)    Albumin 3.4 (*)    AST 199 (*)    ALT 54 (*)    Alkaline Phosphatase 127 (*)    All other components within normal limits  CBC - Abnormal; Notable for the following components:   WBC 3.7 (*)    RBC 3.77 (*)    Hemoglobin 8.3 (*)    HCT 27.5 (*)    MCV 72.9 (*)    MCH 22.0 (*)    RDW 19.6 (*)    Platelets 145 (*)    All other components within normal limits  PROTIME-INR - Abnormal; Notable for the following components:   Prothrombin Time 16.7 (*)  INR 1.3 (*)    All other components within normal limits  DIFFERENTIAL - Abnormal; Notable for the following components:   Neutro Abs 1.1 (*)    All other components within normal limits  APTT  AMMONIA  URINALYSIS, ROUTINE W REFLEX MICROSCOPIC  CBG MONITORING, ED  CBG MONITORING, ED  CBG MONITORING, ED  TROPONIN I (HIGH SENSITIVITY)    EKG Tremulous baseline.  Sinus rhythm with a rate of 70 bpm.  Normal axis and intervals.  No clear signs of acute ischemia.  RADIOLOGY CXR interpreted by me without evidence of acute cardiopulmonary pathology. CT head interpreted by me without evidence of acute intracranial pathology  Official radiology report(s): CT HEAD WO CONTRAST  Result Date: 11/21/2022 CLINICAL DATA:  Status post fall with altered mental status. EXAM: CT HEAD WITHOUT CONTRAST TECHNIQUE: Contiguous axial images were obtained from the base of the skull through the vertex without intravenous contrast. RADIATION DOSE REDUCTION: This exam was performed according to the departmental dose-optimization program which includes automated exposure control, adjustment of the mA and/or kV  according to patient size and/or use of iterative reconstruction technique. COMPARISON:  August 21, 2019 FINDINGS: Brain: There is mild cerebral atrophy with widening of the extra-axial spaces and ventricular dilatation. There are areas of decreased attenuation within the white matter tracts of the supratentorial brain, consistent with microvascular disease changes. Very small, chronic, bilateral basal ganglia lacunar infarcts are noted. Vascular: No hyperdense vessel or unexpected calcification. Skull: Normal. Negative for fracture or focal lesion. Sinuses/Orbits: Chronic changes are seen involving the right maxillary sinus. Other: None. IMPRESSION: 1. No acute intracranial abnormality. 2. Generalized cerebral atrophy and microvascular disease changes of the supratentorial brain. 3. Small, chronic, bilateral basal ganglia lacunar infarcts. 4. Chronic right maxillary sinus disease. Electronically Signed   By: Aram Candela M.D.   On: 11/21/2022 19:43    PROCEDURES and INTERVENTIONS:  .1-3 Lead EKG Interpretation  Performed by: Delton Prairie, MD Authorized by: Delton Prairie, MD     Interpretation: normal     ECG rate:  80   ECG rate assessment: normal     Rhythm: sinus rhythm     Ectopy: none     Conduction: normal     Medications  sodium chloride flush (NS) 0.9 % injection 3 mL (has no administration in time range)     IMPRESSION / MDM / ASSESSMENT AND PLAN / ED COURSE  I reviewed the triage vital signs and the nursing notes.  Differential diagnosis includes, but is not limited to, ***  {Patient presents with symptoms of an acute illness or injury that is potentially life-threatening.}      FINAL CLINICAL IMPRESSION(S) / ED DIAGNOSES   Final diagnoses:  None     Rx / DC Orders   ED Discharge Orders     None        Note:  This document was prepared using Dragon voice recognition software and may include unintentional dictation errors.

## 2022-11-21 NOTE — ED Provider Triage Note (Signed)
Emergency Medicine Provider Triage Evaluation Note  Joel Harrell , a 67 y.o. male  was evaluated in triage.  Pt presents with confusion. LKW 1pm today. Neighbor had checked on him while his wife was out. He had fallen, but was acting per his usual. About 30 minutes later he had fallen again and was more confused and seemed lethargic.   Physical Exam  BP 122/78 (BP Location: Left Arm)   Pulse 84   Temp 98.3 F (36.8 C) (Oral)   Resp 18   SpO2 98%  Gen:   Awake, no distress   Resp:  Normal effort  MSK:   Moves extremities without difficulty  Other:    Medical Decision Making  Medically screening exam initiated at 6:45 PM.  Appropriate orders placed.  Joel Harrell was informed that the remainder of the evaluation will be completed by another provider, this initial triage assessment does not replace that evaluation, and the importance of remaining in the ED until their evaluation is complete.  Stroke protocol started without activation of Code Stroke due to time since LKW.   Chinita Pester, FNP 11/22/22 0031

## 2022-11-22 ENCOUNTER — Emergency Department: Payer: Medicare HMO

## 2022-11-22 DIAGNOSIS — Z7982 Long term (current) use of aspirin: Secondary | ICD-10-CM | POA: Diagnosis not present

## 2022-11-22 DIAGNOSIS — K7682 Hepatic encephalopathy: Secondary | ICD-10-CM | POA: Diagnosis present

## 2022-11-22 DIAGNOSIS — R791 Abnormal coagulation profile: Secondary | ICD-10-CM | POA: Diagnosis present

## 2022-11-22 DIAGNOSIS — Z7952 Long term (current) use of systemic steroids: Secondary | ICD-10-CM | POA: Diagnosis not present

## 2022-11-22 DIAGNOSIS — K7030 Alcoholic cirrhosis of liver without ascites: Secondary | ICD-10-CM | POA: Diagnosis present

## 2022-11-22 DIAGNOSIS — Z79899 Other long term (current) drug therapy: Secondary | ICD-10-CM | POA: Diagnosis not present

## 2022-11-22 DIAGNOSIS — K7290 Hepatic failure, unspecified without coma: Secondary | ICD-10-CM

## 2022-11-22 DIAGNOSIS — R296 Repeated falls: Secondary | ICD-10-CM | POA: Diagnosis present

## 2022-11-22 DIAGNOSIS — I509 Heart failure, unspecified: Secondary | ICD-10-CM | POA: Diagnosis present

## 2022-11-22 DIAGNOSIS — I10 Essential (primary) hypertension: Secondary | ICD-10-CM | POA: Insufficient documentation

## 2022-11-22 DIAGNOSIS — D61818 Other pancytopenia: Secondary | ICD-10-CM | POA: Diagnosis present

## 2022-11-22 DIAGNOSIS — Z7951 Long term (current) use of inhaled steroids: Secondary | ICD-10-CM | POA: Diagnosis not present

## 2022-11-22 DIAGNOSIS — Z8249 Family history of ischemic heart disease and other diseases of the circulatory system: Secondary | ICD-10-CM | POA: Diagnosis not present

## 2022-11-22 DIAGNOSIS — F1010 Alcohol abuse, uncomplicated: Secondary | ICD-10-CM | POA: Diagnosis present

## 2022-11-22 DIAGNOSIS — J449 Chronic obstructive pulmonary disease, unspecified: Secondary | ICD-10-CM | POA: Insufficient documentation

## 2022-11-22 DIAGNOSIS — F1090 Alcohol use, unspecified, uncomplicated: Secondary | ICD-10-CM

## 2022-11-22 DIAGNOSIS — I110 Hypertensive heart disease with heart failure: Secondary | ICD-10-CM | POA: Diagnosis present

## 2022-11-22 DIAGNOSIS — K921 Melena: Secondary | ICD-10-CM | POA: Diagnosis present

## 2022-11-22 DIAGNOSIS — D500 Iron deficiency anemia secondary to blood loss (chronic): Secondary | ICD-10-CM

## 2022-11-22 DIAGNOSIS — K649 Unspecified hemorrhoids: Secondary | ICD-10-CM | POA: Diagnosis present

## 2022-11-22 DIAGNOSIS — J4489 Other specified chronic obstructive pulmonary disease: Secondary | ICD-10-CM | POA: Diagnosis present

## 2022-11-22 LAB — COMPREHENSIVE METABOLIC PANEL
ALT: 51 U/L — ABNORMAL HIGH (ref 0–44)
AST: 190 U/L — ABNORMAL HIGH (ref 15–41)
Albumin: 3 g/dL — ABNORMAL LOW (ref 3.5–5.0)
Alkaline Phosphatase: 128 U/L — ABNORMAL HIGH (ref 38–126)
Anion gap: 7 (ref 5–15)
BUN: 8 mg/dL (ref 8–23)
CO2: 22 mmol/L (ref 22–32)
Calcium: 7.7 mg/dL — ABNORMAL LOW (ref 8.9–10.3)
Chloride: 112 mmol/L — ABNORMAL HIGH (ref 98–111)
Creatinine, Ser: 0.76 mg/dL (ref 0.61–1.24)
GFR, Estimated: >60 mL/min (ref >60)
Glucose, Bld: 124 mg/dL — ABNORMAL HIGH (ref 70–99)
Potassium: 3.4 mmol/L — ABNORMAL LOW (ref 3.5–5.1)
Sodium: 141 mmol/L (ref 135–145)
Total Bilirubin: 0.8 mg/dL (ref 0.3–1.2)
Total Protein: 8.3 g/dL — ABNORMAL HIGH (ref 6.5–8.1)

## 2022-11-22 LAB — URINALYSIS, ROUTINE W REFLEX MICROSCOPIC
Bilirubin Urine: NEGATIVE
Glucose, UA: NEGATIVE mg/dL
Hgb urine dipstick: NEGATIVE
Ketones, ur: NEGATIVE mg/dL
Leukocytes,Ua: NEGATIVE
Nitrite: NEGATIVE
Protein, ur: NEGATIVE mg/dL
Specific Gravity, Urine: 1.01 (ref 1.005–1.030)
Squamous Epithelial / HPF: NONE SEEN /HPF (ref 0–5)
pH: 5 (ref 5.0–8.0)

## 2022-11-22 LAB — AMMONIA: Ammonia: 44 umol/L — ABNORMAL HIGH (ref 9–35)

## 2022-11-22 LAB — HIV ANTIBODY (ROUTINE TESTING W REFLEX): HIV Screen 4th Generation wRfx: NONREACTIVE

## 2022-11-22 LAB — HEMOGLOBIN
Hemoglobin: 8.3 g/dL — ABNORMAL LOW (ref 13.0–17.0)
Hemoglobin: 7.8 g/dL — ABNORMAL LOW (ref 13.0–17.0)

## 2022-11-22 LAB — TYPE AND SCREEN
ABO/RH(D): AB POS
Antibody Screen: NEGATIVE

## 2022-11-22 MED ORDER — ADULT MULTIVITAMIN W/MINERALS CH
1.0000 | ORAL_TABLET | Freq: Every day | ORAL | Status: DC
  Administered 2022-11-22 – 2022-11-23 (×2): 1 via ORAL
  Filled 2022-11-22 (×2): qty 1

## 2022-11-22 MED ORDER — ENSURE MAX PROTEIN PO LIQD
11.0000 [oz_av] | Freq: Every day | ORAL | Status: DC
  Filled 2022-11-22: qty 330

## 2022-11-22 MED ORDER — LORAZEPAM 1 MG PO TABS: 1.0000 mg | ORAL_TABLET | ORAL | Status: DC | PRN

## 2022-11-22 MED ORDER — LORAZEPAM 2 MG/ML IJ SOLN
1.0000 mg | INTRAMUSCULAR | Status: DC | PRN
  Administered 2022-11-23: 1 mg via INTRAVENOUS
  Filled 2022-11-22: qty 1

## 2022-11-22 MED ORDER — ONDANSETRON HCL 4 MG/2ML IJ SOLN: 4.0000 mg | Freq: Four times a day (QID) | INTRAMUSCULAR | Status: DC | PRN

## 2022-11-22 MED ORDER — ACETAMINOPHEN 325 MG PO TABS
650.0000 mg | ORAL_TABLET | Freq: Four times a day (QID) | ORAL | Status: DC | PRN
  Administered 2022-11-22: 650 mg via ORAL
  Filled 2022-11-22: qty 2

## 2022-11-22 MED ORDER — ONDANSETRON HCL 4 MG PO TABS: 4.0000 mg | ORAL_TABLET | Freq: Four times a day (QID) | ORAL | Status: DC | PRN

## 2022-11-22 MED ORDER — POLYETHYLENE GLYCOL 3350 17 G PO PACK
17.0000 g | PACK | Freq: Every day | ORAL | Status: DC
  Administered 2022-11-22 – 2022-11-23 (×2): 17 g via ORAL
  Filled 2022-11-22 (×2): qty 1

## 2022-11-22 MED ORDER — PANTOPRAZOLE SODIUM 40 MG IV SOLR
40.0000 mg | INTRAVENOUS | Status: DC
  Administered 2022-11-23: 40 mg via INTRAVENOUS
  Filled 2022-11-22: qty 10

## 2022-11-22 MED ORDER — LACTULOSE 10 GM/15ML PO SOLN
30.0000 g | Freq: Once | ORAL | Status: AC
  Administered 2022-11-22: 30 g via ORAL
  Filled 2022-11-22 (×2): qty 60

## 2022-11-22 MED ORDER — THIAMINE MONONITRATE 100 MG PO TABS
100.0000 mg | ORAL_TABLET | Freq: Every day | ORAL | Status: DC
  Administered 2022-11-22 – 2022-11-23 (×2): 100 mg via ORAL
  Filled 2022-11-22 (×2): qty 1

## 2022-11-22 MED ORDER — THIAMINE HCL 100 MG/ML IJ SOLN
100.0000 mg | Freq: Every day | INTRAMUSCULAR | Status: DC
  Filled 2022-11-22: qty 2

## 2022-11-22 MED ORDER — ALBUTEROL SULFATE (2.5 MG/3ML) 0.083% IN NEBU: 2.5000 mg | INHALATION_SOLUTION | Freq: Four times a day (QID) | RESPIRATORY_TRACT | Status: DC | PRN

## 2022-11-22 MED ORDER — ACETAMINOPHEN 650 MG RE SUPP: 650.0000 mg | Freq: Four times a day (QID) | RECTAL | Status: DC | PRN

## 2022-11-22 MED ORDER — FOLIC ACID 1 MG PO TABS
1.0000 mg | ORAL_TABLET | Freq: Every day | ORAL | Status: DC
  Administered 2022-11-22 – 2022-11-23 (×2): 1 mg via ORAL
  Filled 2022-11-22 (×2): qty 1

## 2022-11-22 NOTE — Assessment & Plan Note (Signed)
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

## 2022-11-22 NOTE — Care Plan (Signed)
This 67 years old Male with PMH significant for COPD, hypertension, daily alcohol use presented in the ED s/p falls and being found to be confused with slurred speech.  On arrival patient was disoriented.  History is obtained from the patient's wife who reports patient underestimates his drinking and drinks about a pint of liquor and 4 cans of beer every day. He was also told few years back that he had liver disease but he has not followed up with his doctors afterwards.  He presented with abdominal distention and swelling of his feet.  Patient also reports seeing blood in the stools which he endorses it is from hemorrhoids.  Significant labs in the ED showed abnormal LFTs, Ammonia 44.  CT head showed chronic bilateral basal ganglia lacunar infarcts.  Right upper quadrant ultrasound showed cholelithiasis and findings suggestive of hepatic cirrhosis.  Patient was admitted for hepatic encephalopathy and suspected chronic blood loss anemia.  Patient is started on lactulose and IV Protonix.  Patient was seen and examined at bedside.  Patient seems much improved.  He is back to his baseline mental status.  Patient was evaluated by gastroenterologist who recommended no plans for any GI evaluation during this hospitalization.

## 2022-11-22 NOTE — Assessment & Plan Note (Addendum)
Drinks up to a pint of liquor daily and 4 cans of beer, per wife CIWA withdrawal protocol

## 2022-11-22 NOTE — Assessment & Plan Note (Signed)
Blood pressure controlled Will hold off on antihypertensives given concern for chronic blood loss

## 2022-11-22 NOTE — Assessment & Plan Note (Addendum)
Hematochezia and melena Hemoglobin 8.3 with low MCV with most recent 15.8 from 2 years prior with patient reporting intermittent dark stool and blood in stool Trend hemoglobin and transfuse if necessary Continue Protonix.  Will hold off on octreotide for possible variceal etiology for now Keep n.p.o. for possible procedure GI consult to evaluate for endoscopy

## 2022-11-22 NOTE — Assessment & Plan Note (Signed)
Not acutely exacerbated DuoNebs as needed 

## 2022-11-22 NOTE — H&P (Signed)
History and Physical    Patient: Joel Harrell:[id number] DOB: 04-13-1956 DOA: 11/21/2022 DOS: the patient was seen and examined on 11/22/2022 PCP: Patient, No Pcp Per  Patient coming from: Home  Chief Complaint:  Chief Complaint  Patient presents with   Altered Mental Status    HPI: Joel Harrell is a 67 y.o. male with medical history significant for COPD and hypertension as well as daily alcohol use who was brought to the ED after having a couple falls and being found to be confused and with slurred speech.  On arrival patient was awake and alert on arrival but was disoriented.  History is taken from wife who states that patient underestimates his drinking and drinks about a pint of liquor and 4 cans of beer a day.  He was told some years ago that he had problems with his liver but he has not followed up with doctors as advised.  She noticed that he has had worsening abdominal distention and swelling in his feet.  She states for the past month he has been seeing blood in his stool which he states is from his hemorrhoids.  Patient endorsed seeing dark stool for over a month.  He denies vomiting or abdominal pain.  He has had no cough, chest pain fever or chills or shortness of breath. ED course and data review: Vitals within normal limits.  Labs significant for mild leukopenia of 3.7, anemia of 8.3 and platelets 145,000.  Abnormal LFTs with AST/ALT of 199/54, alk phos of 127 and normal bilirubin.  INR 1.3.  Troponin 7.  Ammonia 44. EKG, personally viewed and interpreted showed NSR at 70 with nonspecific ST-T wave changes. CT head nonacute, but showing small chronic bilateral basal ganglia lacunar infarcts and chronic right maxillary sinus disease Chest x-ray clear Right upper quadrant ultrasound showing cholelithiasis and findings suggestive of hepatic cirrhosis. Patient was started on IV Protonix and given a dose of lactulose. Hospitalist consulted for admission for hepatic encephalopathy and  suspected chronic blood loss anemia..   Review of Systems: Limited due to confusion  Past Medical History:  Diagnosis Date   Asthma    CHF (congestive heart failure) (HCC)    Hypertension    History reviewed. No pertinent surgical history. Social History:  reports that he has never smoked. He has never used smokeless tobacco. He reports current alcohol use of about 10.0 standard drinks of alcohol per week. He reports that he does not use drugs.  No Known Allergies  Family History  Problem Relation Age of Onset   Heart attack Father    Cancer Mother     Prior to Admission medications   Medication Sig Start Date End Date Taking? Authorizing Provider  albuterol (PROVENTIL) (2.5 MG/3ML) 0.083% nebulizer solution Take 3 mLs (2.5 mg total) by nebulization every 6 (six) hours as needed for wheezing or shortness of breath. 12/07/19   Loleta Rose, MD  albuterol (VENTOLIN HFA) 108 (90 Base) MCG/ACT inhaler Inhale 2 puffs into the lungs every 6 (six) hours as needed for wheezing or shortness of breath. 12/07/19   Loleta Rose, MD  amLODipine (NORVASC) 5 MG tablet Take 1 tablet (5 mg total) by mouth daily. 12/07/19 06/04/20  Loleta Rose, MD  aspirin 81 MG EC tablet Take 81 mg by mouth daily.      [provider]  budesonide-formoterol (SYMBICORT) 160-4.5 MCG/ACT inhaler Inhale 2 puffs into the lungs 2 (two) times daily. 12/05/18   Jene Every, MD  lisinopril (ZESTRIL) 20 MG  tablet Take 1 tablet (20 mg total) by mouth daily. 12/07/19 06/04/20  Loleta Rose, MD  montelukast (SINGULAIR) 10 MG tablet Take 1 tablet (10 mg total) by mouth at bedtime. 12/28/15 12/27/16  Jennye Moccasin, MD  omeprazole (PRILOSEC) 20 MG capsule Take 20 mg by mouth daily.      [provider]  predniSONE (DELTASONE) 50 MG tablet Take 1 tablet (50 mg total) by mouth daily with breakfast. 12/05/18   Jene Every, MD    Physical Exam: Vitals:   11/21/22 1838 11/21/22 2245  BP: 122/78 138/78  Pulse:  84 87  Resp: 18 17  Temp: 98.3 F (36.8 C) 97.8 F (36.6 C)  TempSrc: Oral Oral  SpO2: 98% 98%   Physical Exam Vitals and nursing note reviewed.  Constitutional:      General: He is not in acute distress. HENT:     Head: Normocephalic and atraumatic.  Cardiovascular:     Rate and Rhythm: Normal rate and regular rhythm.     Heart sounds: Normal heart sounds.  Pulmonary:     Effort: Pulmonary effort is normal.     Breath sounds: Normal breath sounds.  Abdominal:     General: There is distension.     Palpations: Abdomen is soft.     Tenderness: There is no abdominal tenderness.  Musculoskeletal:     Right lower leg: Edema present.     Left lower leg: Edema present.  Neurological:     Mental Status: He is disoriented.     Labs on Admission: I have personally reviewed following labs and imaging studies  CBC: Recent Labs  Lab 11/21/22 1841  WBC 3.7*  NEUTROABS 1.1*  HGB 8.3*  HCT 27.5*  MCV 72.9*  PLT 145*   Basic Metabolic Panel: Recent Labs  Lab 11/21/22 1841  NA 137  K 3.5  CL 109  CO2 21*  GLUCOSE 102*  BUN 8  CREATININE 0.67  CALCIUM 7.5*   GFR: CrCl cannot be calculated (Unknown ideal weight.). Liver Function Tests: Recent Labs  Lab 11/21/22 1841  AST 199*  ALT 54*  ALKPHOS 127*  BILITOT 0.9  PROT 9.1*  ALBUMIN 3.4*   No results for input(s): "LIPASE", "AMYLASE" in the last 168 hours. Recent Labs  Lab 11/21/22 2313  AMMONIA 44*   Coagulation Profile: Recent Labs  Lab 11/21/22 2251  INR 1.3*   Cardiac Enzymes: No results for input(s): "CKTOTAL", "CKMB", "CKMBINDEX", "TROPONINI" in the last 168 hours. BNP (last 3 results) No results for input(s): "PROBNP" in the last 8760 hours. HbA1C: No results for input(s): "HGBA1C" in the last 72 hours. CBG: Recent Labs  Lab 11/21/22 1827  GLUCAP 99   Lipid Profile: No results for input(s): "CHOL", "HDL", "LDLCALC", "TRIG", "CHOLHDL", "LDLDIRECT" in the last 72 hours. Thyroid Function  Tests: No results for input(s): "TSH", "T4TOTAL", "FREET4", "T3FREE", "THYROIDAB" in the last 72 hours. Anemia Panel: No results for input(s): "VITAMINB12", "FOLATE", "FERRITIN", "TIBC", "IRON", "RETICCTPCT" in the last 72 hours. Urine analysis:    Component Value Date/Time   COLORURINE YELLOW (A) 09/10/2019 0130   APPEARANCEUR CLEAR (A) 09/10/2019 0130   LABSPEC 1.009 09/10/2019 0130   PHURINE 5.0 09/10/2019 0130   GLUCOSEU NEGATIVE 09/10/2019 0130   HGBUR NEGATIVE 09/10/2019 0130   BILIRUBINUR NEGATIVE 09/10/2019 0130   KETONESUR NEGATIVE 09/10/2019 0130   PROTEINUR NEGATIVE 09/10/2019 0130   NITRITE NEGATIVE 09/10/2019 0130   LEUKOCYTESUR NEGATIVE 09/10/2019 0130    Radiological Exams on Admission: US ABDOMEN LIMITED  RUQ (LIVER/GB)  Result Date: 11/22/2022 CLINICAL DATA:  Transaminitis. EXAM: ULTRASOUND ABDOMEN LIMITED RIGHT UPPER QUADRANT COMPARISON:  None Available. FINDINGS: Gallbladder: Multiple shadowing echogenic gallstones are seen within the dependent portion of the gallbladder lumen. The largest measures approximately 1.1 cm. There is no evidence of gallbladder wall thickening (1.9 mm) no sonographic Murphy sign noted by sonographer. Common bile duct: Diameter: 6.1 Liver: No focal lesion identified. The liver parenchyma is nodular in contour and within normal limits in parenchymal echogenicity. Portal vein is patent on color Doppler imaging with normal direction of blood flow towards the liver. Other: None. IMPRESSION: 1. Cholelithiasis. 2. Findings suggestive of hepatic cirrhosis. Electronically Signed   By: Aram Candela M.D.   On: 11/22/2022 00:25   DG Chest 1 View  Result Date: 11/21/2022 CLINICAL DATA:  Altered mental status. EXAM: CHEST  1 VIEW COMPARISON:  Dec 06, 2019 FINDINGS: The heart size and mediastinal contours are within normal limits. There is stable tortuosity of the descending thoracic aorta. Both lungs are clear. Multilevel degenerative changes seen  throughout the thoracic spine. IMPRESSION: No active disease. Electronically Signed   By: Aram Candela M.D.   On: 11/21/2022 23:43   CT HEAD WO CONTRAST  Result Date: 11/21/2022 CLINICAL DATA:  Status post fall with altered mental status. EXAM: CT HEAD WITHOUT CONTRAST TECHNIQUE: Contiguous axial images were obtained from the base of the skull through the vertex without intravenous contrast. RADIATION DOSE REDUCTION: This exam was performed according to the departmental dose-optimization program which includes automated exposure control, adjustment of the mA and/or kV according to patient size and/or use of iterative reconstruction technique. COMPARISON:  August 21, 2019 FINDINGS: Brain: There is mild cerebral atrophy with widening of the extra-axial spaces and ventricular dilatation. There are areas of decreased attenuation within the white matter tracts of the supratentorial brain, consistent with microvascular disease changes. Very small, chronic, bilateral basal ganglia lacunar infarcts are noted. Vascular: No hyperdense vessel or unexpected calcification. Skull: Normal. Negative for fracture or focal lesion. Sinuses/Orbits: Chronic changes are seen involving the right maxillary sinus. Other: None. IMPRESSION: 1. No acute intracranial abnormality. 2. Generalized cerebral atrophy and microvascular disease changes of the supratentorial brain. 3. Small, chronic, bilateral basal ganglia lacunar infarcts. 4. Chronic right maxillary sinus disease. Electronically Signed   By: Aram Candela M.D.   On: 11/21/2022 19:43     Data Reviewed: Relevant notes from primary care and specialist visits, past discharge summaries as available in EHR, including Care Everywhere. 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  Will hold off on octreotide for possible variceal etiology for now Keep n.p.o. for possible procedure GI consult to evaluate for endoscopy   Alcohol use disorder Drinks up to a pint of liquor daily and 4 cans of beer, per  wife CIWA withdrawal protocol  COPD (chronic obstructive pulmonary disease) (HCC) Not acutely exacerbated DuoNebs as needed  Hypertension Blood pressure controlled Will hold off on antihypertensives given concern for chronic blood loss     DVT prophylaxis: SCD  Consults: GI, Dr Mia Creek  Advance Care Planning: full code  Family Communication: wife at bedside  Disposition Plan: Back to previous home environment  Severity of Illness: The appropriate patient status for this patient is INPATIENT. Inpatient status is judged to be reasonable and necessary in order to provide the required intensity of service to ensure the patient's safety. The patient's presenting symptoms, physical exam findings, and initial radiographic and laboratory data in the context of their chronic comorbidities is felt to place them at high risk for further clinical deterioration. Furthermore, it is not anticipated that the patient will be medically stable for discharge from the hospital within 2 midnights of admission.   * I  certify that at the point of admission it is my clinical judgment that the patient will require inpatient hospital care spanning beyond 2 midnights from the point of admission due to high intensity of service, high risk for further deterioration and high frequency of surveillance required.*  Author: Andris Baumann, MD 11/22/2022 1:38 AM  For on call review [email or web address].

## 2022-11-22 NOTE — Consult Note (Signed)
Consultation  Referring Provider:   Hospitalist   Admit date: 11/21/2022 Consult date: 11/22/2022         Reason for Consultation: Decompensated cirrhosis              HPI:   Joel Harrell is a 67 y.o. gentleman with history of hypertension and alcohol abuse who presented to the hospital with history of falls and appears to have been slightly altered on presentation. Imaging findings concerning for cirrhosis. He says a pint of liquor will last a week and he also drinks beer daily. On my interview he is alert and oriented x 3. He is slightly tremulous. He states that he had some dark stools about 1 month ago but has since been brown including bowel movements today. No nausea, vomiting, hematemesis, or hematochezia. His last drink was yesterday. His brother possibly had cirrhosis from alcohol. He denies any history of IV drug use. He maybe had a colonoscopy 15 years ago. No NSAID use. He states he sleeps well at night but also sleeps a lot at night. He denies any symptoms of withdrawal in the past.  Past Medical History:  Diagnosis Date   Asthma    CHF (congestive heart failure) (HCC)    Hypertension     History reviewed. No pertinent surgical history.  Family History  Problem Relation Age of Onset   Heart attack Father    Cancer Mother   Brother with cirrhosis, likely alcohol  Social History   Tobacco Use   Smoking status: Never   Smokeless tobacco: Never  Vaping Use   Vaping Use: Never used  Substance Use Topics   Alcohol use: Yes    Alcohol/week: 10.0 standard drinks of alcohol    Types: 10 Cans of beer per week   Drug use: No    Prior to Admission medications   Medication Sig Start Date End Date Taking? Authorizing Provider  amLODipine (NORVASC) 5 MG tablet Take 1 tablet (5 mg total) by mouth daily. 12/07/19 11/22/22 Yes Loleta Rose, MD  aspirin 81 MG EC tablet Take 81 mg by mouth daily.     Yes [provider]  lisinopril (ZESTRIL) 20 MG tablet Take 1 tablet (20 mg  total) by mouth daily. 12/07/19 11/22/22 Yes Loleta Rose, MD  albuterol (PROVENTIL) (2.5 MG/3ML) 0.083% nebulizer solution Take 3 mLs (2.5 mg total) by nebulization every 6 (six) hours as needed for wheezing or shortness of breath. Patient not taking: Reported on 11/22/2022 12/07/19   Loleta Rose, MD  albuterol (VENTOLIN HFA) 108 (90 Base) MCG/ACT inhaler Inhale 2 puffs into the lungs every 6 (six) hours as needed for wheezing or shortness of breath. Patient not taking: Reported on 11/22/2022 12/07/19   Loleta Rose, MD  budesonide-formoterol [facility]) 160-4.5 MCG/ACT inhaler Inhale 2 puffs into the lungs 2 (two) times daily. Patient not taking: Reported on 11/22/2022 12/05/18   Jene Every, MD  montelukast (SINGULAIR) 10 MG tablet Take 1 tablet (10 mg total) by mouth at bedtime. 12/28/15 12/27/16  Jennye Moccasin, MD  omeprazole (PRILOSEC) 20 MG capsule Take 20 mg by mouth daily.   Patient not taking: Reported on 11/22/2022    [provider]  predniSONE (DELTASONE) 50 MG tablet Take 1 tablet (50 mg total) by mouth daily with breakfast. Patient not taking: Reported on 11/22/2022 12/05/18   Jene Every, MD    Current Facility-Administered Medications  Medication Dose Route Frequency Provider Last Rate Last Admin   acetaminophen (TYLENOL) tablet 650 mg  650  mg Oral Q6H PRN Andris Baumann, MD       Or   acetaminophen (TYLENOL) suppository 650 mg  650 mg Rectal Q6H PRN Andris Baumann, MD       albuterol (PROVENTIL) (2.5 MG/3ML) 0.083% nebulizer solution 2.5 mg  2.5 mg Nebulization Q6H PRN Andris Baumann, MD       folic acid (FOLVITE) tablet 1 mg  1 mg Oral Daily Andris Baumann, MD       LORazepam (ATIVAN) tablet 1-4 mg  1-4 mg Oral Q1H PRN Andris Baumann, MD       Or   LORazepam (ATIVAN) injection 1-4 mg  1-4 mg Intravenous Q1H PRN Andris Baumann, MD       multivitamin with minerals tablet 1 tablet  1 tablet Oral Daily Andris Baumann, MD       ondansetron [facility]) tablet 4 mg   4 mg Oral Q6H PRN Andris Baumann, MD       Or   ondansetron [facility]) injection 4 mg  4 mg Intravenous Q6H PRN Andris Baumann, MD       pantoprazole (PROTONIX) injection 40 mg  40 mg Intravenous Q24H Andris Baumann, MD       thiamine (VITAMIN B1) tablet 100 mg  100 mg Oral Daily Andris Baumann, MD       Or   thiamine (VITAMIN B1) injection 100 mg  100 mg Intravenous Daily Andris Baumann, MD       Current Outpatient Medications  Medication Sig Dispense Refill   amLODipine (NORVASC) 5 MG tablet Take 1 tablet (5 mg total) by mouth daily. 90 tablet 1   aspirin 81 MG EC tablet Take 81 mg by mouth daily.       lisinopril (ZESTRIL) 20 MG tablet Take 1 tablet (20 mg total) by mouth daily. 90 tablet 1   albuterol (PROVENTIL) (2.5 MG/3ML) 0.083% nebulizer solution Take 3 mLs (2.5 mg total) by nebulization every 6 (six) hours as needed for wheezing or shortness of breath. (Patient not taking: Reported on 11/22/2022) 75 mL 2   albuterol (VENTOLIN HFA) 108 (90 Base) MCG/ACT inhaler Inhale 2 puffs into the lungs every 6 (six) hours as needed for wheezing or shortness of breath. (Patient not taking: Reported on 11/22/2022) 6.7 g 2   budesonide-formoterol (SYMBICORT) 160-4.5 MCG/ACT inhaler Inhale 2 puffs into the lungs 2 (two) times daily. (Patient not taking: Reported on 11/22/2022) 1 Inhaler 1   montelukast (SINGULAIR) 10 MG tablet Take 1 tablet (10 mg total) by mouth at bedtime. 30 tablet 2   omeprazole (PRILOSEC) 20 MG capsule Take 20 mg by mouth daily.   (Patient not taking: Reported on 11/22/2022)     predniSONE (DELTASONE) 50 MG tablet Take 1 tablet (50 mg total) by mouth daily with breakfast. (Patient not taking: Reported on 11/22/2022) 5 tablet 0    Allergies as of 11/21/2022   (No Known Allergies)     Review of Systems:    All systems reviewed and negative except where noted in HPI.  Review of Systems  Constitutional:  Negative for chills, fever and malaise/fatigue.  Respiratory:  Negative  for shortness of breath.   Cardiovascular:  Negative for chest pain.  Gastrointestinal:  Negative for abdominal pain, blood in stool, constipation, diarrhea and melena.  Musculoskeletal:  Positive for falls.  Skin:  Negative for rash.  Neurological:  Negative for focal weakness.  Psychiatric/Behavioral:  Positive for substance abuse.   All  other systems reviewed and are negative.      Physical Exam:  Vital signs in last 24 hours: Temp:  [97.8 F (36.6 C)-98.5 F (36.9 C)] 98.5 F (36.9 C) (05/12 0653) Pulse Rate:  [84-118] 118 (05/12 0649) Resp:  [17-18] 18 (05/12 0649) BP: (122-138)/(74-78) 129/74 (05/12 0649) SpO2:  [98 %] 98 % (05/12 0649)   General:   Pleasant in NAD Head:  Normocephalic and atraumatic. Eyes:   No icterus.   Conjunctiva pink. Mouth: Mucosa pink moist, no lesions. Neck:  Supple; no masses felt Lungs:  No respiratory distress Heart: slightly tachycardic Abdomen:   slightly protuberant but no discernible fluid wave Msk:  No clubbing or cyanosis. Strength 5/5 Neurologic:  Alert and  oriented x4;  No overt asterixis Skin:  Warm, dry, pink without significant lesions or rashes. Psych:  Alert and cooperative. Normal affect.  LAB RESULTS: Recent Labs    11/21/22 1841 11/22/22 0240 11/22/22 0650  WBC 3.7*  --   --   HGB 8.3* 8.3* 7.8*  HCT 27.5*  --   --   PLT 145*  --   --    BMET Recent Labs    11/21/22 1841 11/22/22 0650  NA 137 141  K 3.5 3.4*  CL 109 112*  CO2 21* 22  GLUCOSE 102* 124*  BUN 8 8  CREATININE 0.67 0.76  CALCIUM 7.5* 7.7*   LFT Recent Labs    11/22/22 0650  PROT 8.3*  ALBUMIN 3.0*  AST 190*  ALT 51*  ALKPHOS 128*  BILITOT 0.8   PT/INR Recent Labs    11/21/22 2251  LABPROT 16.7*  INR 1.3*    STUDIES: US ABDOMEN LIMITED RUQ (LIVER/GB)  Result Date: 11/22/2022 CLINICAL DATA:  Transaminitis. EXAM: ULTRASOUND ABDOMEN LIMITED RIGHT UPPER QUADRANT COMPARISON:  None Available. FINDINGS: Gallbladder: Multiple  shadowing echogenic gallstones are seen within the dependent portion of the gallbladder lumen. The largest measures approximately 1.1 cm. There is no evidence of gallbladder wall thickening (1.9 mm) no sonographic Murphy sign noted by sonographer. Common bile duct: Diameter: 6.1 Liver: No focal lesion identified. The liver parenchyma is nodular in contour and within normal limits in parenchymal echogenicity. Portal vein is patent on color Doppler imaging with normal direction of blood flow towards the liver. Other: None. IMPRESSION: 1. Cholelithiasis. 2. Findings suggestive of hepatic cirrhosis. Electronically Signed   By: Aram Candela M.D.   On: 11/22/2022 00:25   DG Chest 1 View  Result Date: 11/21/2022 CLINICAL DATA:  Altered mental status. EXAM: CHEST  1 VIEW COMPARISON:  Dec 06, 2019 FINDINGS: The heart size and mediastinal contours are within normal limits. There is stable tortuosity of the descending thoracic aorta. Both lungs are clear. Multilevel degenerative changes seen throughout the thoracic spine. IMPRESSION: No active disease. Electronically Signed   By: Aram Candela M.D.   On: 11/21/2022 23:43   CT HEAD WO CONTRAST  Result Date: 11/21/2022 CLINICAL DATA:  Status post fall with altered mental status. EXAM: CT HEAD WITHOUT CONTRAST TECHNIQUE: Contiguous axial images were obtained from the base of the skull through the vertex without intravenous contrast. RADIATION DOSE REDUCTION: This exam was performed according to the departmental dose-optimization program which includes automated exposure control, adjustment of the mA and/or kV according to patient size and/or use of iterative reconstruction technique. COMPARISON:  August 21, 2019 FINDINGS: Brain: There is mild cerebral atrophy with widening of the extra-axial spaces and ventricular dilatation. There are areas of decreased attenuation within the white  matter tracts of the supratentorial brain, consistent with microvascular disease  changes. Very small, chronic, bilateral basal ganglia lacunar infarcts are noted. Vascular: No hyperdense vessel or unexpected calcification. Skull: Normal. Negative for fracture or focal lesion. Sinuses/Orbits: Chronic changes are seen involving the right maxillary sinus. Other: None. IMPRESSION: 1. No acute intracranial abnormality. 2. Generalized cerebral atrophy and microvascular disease changes of the supratentorial brain. 3. Small, chronic, bilateral basal ganglia lacunar infarcts. 4. Chronic right maxillary sinus disease. Electronically Signed   By: Aram Candela M.D.   On: 11/21/2022 19:43       Impression / Plan:   67 y/o gentleman with history of hypertension and alcohol abuse who presents with falls, possible encephalopathy, and imaging/labs findings of cirrhosis. No current overt GI bleeding. No ascites on physical exam.  - counseled on alcohol abstinence - will advance diet to solids given no overt GI bleeding - protein supplementation - will order basic anemia work up - will check for viral hepatitis - limit tylenol to 2 grams daily - monitor for any withdrawal symptoms - will likely perform EGD/Colonoscopy as an outpatient - titrate bowel movements for 2-3 daily, will start with miralax as no evidence of HE currently - will continue to follow, please call with any questions or concerns  Merlyn Lot MD, MPH [facility]

## 2022-11-23 DIAGNOSIS — K7682 Hepatic encephalopathy: Secondary | ICD-10-CM | POA: Diagnosis not present

## 2022-11-23 LAB — IRON AND TIBC
Iron: 117 ug/dL (ref 45–182)
Saturation Ratios: 24 % (ref 17.9–39.5)
TIBC: 480 ug/dL — ABNORMAL HIGH (ref 250–450)
UIBC: 363 ug/dL

## 2022-11-23 LAB — HEPATITIS PANEL, ACUTE
HCV Ab: REACTIVE — AB
Hep A IgM: NONREACTIVE
Hep B C IgM: NONREACTIVE
Hepatitis B Surface Ag: NONREACTIVE

## 2022-11-23 LAB — COMPREHENSIVE METABOLIC PANEL
ALT: 46 U/L — ABNORMAL HIGH (ref 0–44)
AST: 141 U/L — ABNORMAL HIGH (ref 15–41)
Albumin: 3.1 g/dL — ABNORMAL LOW (ref 3.5–5.0)
Alkaline Phosphatase: 132 U/L — ABNORMAL HIGH (ref 38–126)
Anion gap: 8 (ref 5–15)
BUN: 10 mg/dL (ref 8–23)
CO2: 23 mmol/L (ref 22–32)
Calcium: 8.1 mg/dL — ABNORMAL LOW (ref 8.9–10.3)
Chloride: 104 mmol/L (ref 98–111)
Creatinine, Ser: 0.67 mg/dL (ref 0.61–1.24)
GFR, Estimated: >60 mL/min (ref >60)
Glucose, Bld: 104 mg/dL — ABNORMAL HIGH (ref 70–99)
Potassium: 3.1 mmol/L — ABNORMAL LOW (ref 3.5–5.1)
Sodium: 135 mmol/L (ref 135–145)
Total Bilirubin: 1.7 mg/dL — ABNORMAL HIGH (ref 0.3–1.2)
Total Protein: 9 g/dL — ABNORMAL HIGH (ref 6.5–8.1)

## 2022-11-23 LAB — HEMOGLOBIN AND HEMATOCRIT, BLOOD
HCT: 26.7 % — ABNORMAL LOW (ref 39.0–52.0)
Hemoglobin: 8 g/dL — ABNORMAL LOW (ref 13.0–17.0)

## 2022-11-23 LAB — VITAMIN B12: Vitamin B-12: 284 pg/mL (ref 180–914)

## 2022-11-23 LAB — FERRITIN: Ferritin: 13 ng/mL — ABNORMAL LOW (ref 24–336)

## 2022-11-23 LAB — PROTIME-INR
INR: 1.4 — ABNORMAL HIGH (ref 0.8–1.2)
Prothrombin Time: 16.9 seconds — ABNORMAL HIGH (ref 11.4–15.2)

## 2022-11-23 LAB — FOLATE: Folate: 11.4 ng/mL (ref >5.9)

## 2022-11-23 MED ORDER — POLYETHYLENE GLYCOL 3350 17 G PO PACK: 17.0000 g | PACK | Freq: Every day | ORAL | 0 refills | Status: DC

## 2022-11-23 MED ORDER — LISINOPRIL 20 MG PO TABS: 20.0000 mg | ORAL_TABLET | Freq: Every day | ORAL | 0 refills | Status: DC

## 2022-11-23 MED ORDER — VITAMIN B-1 100 MG PO TABS: 100.0000 mg | ORAL_TABLET | Freq: Every day | ORAL | 1 refills | Status: DC

## 2022-11-23 MED ORDER — FOLIC ACID 1 MG PO TABS: 1.0000 mg | ORAL_TABLET | Freq: Every day | ORAL | 1 refills | Status: DC

## 2022-11-23 MED ORDER — AMLODIPINE BESYLATE 5 MG PO TABS: 5.0000 mg | ORAL_TABLET | Freq: Every day | ORAL | 1 refills | Status: DC

## 2022-11-23 MED ORDER — PANTOPRAZOLE SODIUM 40 MG PO TBEC: 40.0000 mg | DELAYED_RELEASE_TABLET | Freq: Every day | ORAL | 1 refills | Status: DC

## 2022-11-23 MED ORDER — POTASSIUM CHLORIDE 20 MEQ PO PACK
40.0000 meq | PACK | Freq: Once | ORAL | Status: AC
  Administered 2022-11-23: 40 meq via ORAL
  Filled 2022-11-23: qty 2

## 2022-11-23 MED ORDER — FERROUS SULFATE 325 (65 FE) MG PO TBEC: 325.0000 mg | DELAYED_RELEASE_TABLET | Freq: Two times a day (BID) | ORAL | 3 refills | Status: DC

## 2022-11-23 NOTE — Discharge Instructions (Signed)
Advised to follow-up with primary care physician in 1 week. Advised to follow-up with gastroenterology as scheduled for outpatient colonoscopy and EGD. Advised to take pantoprazole 40 mg daily. Advised to refrain from drinking alcohol and substance abuse.

## 2022-11-23 NOTE — Discharge Summary (Signed)
Physician Discharge Summary  Joel Harrell ZOX:[id number] DOB: March 13, 1956 DOA: 11/21/2022  PCP: Patient, No Pcp Per  Admit date: 11/21/2022  Discharge date: 11/23/2022  Admitted From: Home.  Disposition:  Home.  Recommendations for Outpatient Follow-up:  Follow up with PCP in 1-2 weeks.. Please obtain BMP/CBC in one week Advised to follow-up with Gastroenterology as scheduled for outpatient colonoscopy and EGD. Advised to take pantoprazole 40 mg daily. Advised to refrain from drinking alcohol and substance abuse.  Home Health: None Equipment/Devices: None  Discharge Condition: Stable CODE STATUS:Full code Diet recommendation: Heart Healthy   Brief [facility] Course: This 67 years old Male with PMH significant for COPD, hypertension, daily alcohol use presented in the ED s/p falls and being found to be confused with slurred speech.  On arrival patient was disoriented.  History is obtained from the patient's wife who reports patient underestimates his drinking and drinks about a pint of liquor and 4 cans of beer every day. He was also told few years back that he had liver disease but he has not followed up with his doctors afterwards.  He presented with abdominal distention and swelling of his feet.  Patient also reports seeing blood in the stools which he endorses it is from hemorrhoids.  Significant labs in the ED showed abnormal LFTs, Ammonia 44.  CT head showed chronic bilateral basal ganglia lacunar infarcts.  Right upper quadrant ultrasound showed cholelithiasis and findings suggestive of hepatic cirrhosis.  Patient was admitted for hepatic encephalopathy and suspected chronic blood loss anemia.  Patient was started on lactulose and IV Protonix.  Patient seems much improved.  He is back to his baseline mental status.  Patient had 3 bowel movements his mental status back to normal.  Hepatic encephalopathy resolved.  H&H remained stable.  Patient was evaluated by gastroenterologist who  recommended no plans for any GI evaluation during this hospitalization.  Patient can be discharged and recommended outpatient colonoscopy and EGD.  Patient is being discharged home.  Discharge Diagnoses:  Principal Problem:   Acute hepatic encephalopathy (HCC) Active Problems:   Decompensated hepatic cirrhosis (HCC)   Iron deficiency anemia due to chronic blood loss   Alcohol use disorder   Hypertension   COPD (chronic obstructive pulmonary disease) (HCC)    Discharge Instructions  Discharge Instructions     Call MD for:  difficulty breathing, headache or visual disturbances   Complete by: As directed    Call MD for:  persistant dizziness or light-headedness   Complete by: As directed    Call MD for:  persistant nausea and vomiting   Complete by: As directed    Diet - low sodium heart healthy   Complete by: As directed    Diet Carb Modified   Complete by: As directed    Discharge instructions   Complete by: As directed    Advised to follow-up with primary care physician in 1 week. Advised to follow-up with gastroenterology as scheduled for outpatient colonoscopy and EGD. Advised to take pantoprazole 40 mg daily. Advised to refrain from drinking alcohol and substance abuse.   Increase activity slowly   Complete by: As directed       Allergies as of 11/23/2022   No Known Allergies      Medication List     STOP taking these medications    omeprazole 20 MG capsule Commonly known as: PRILOSEC   predniSONE 50 MG tablet Commonly known as: DELTASONE       TAKE these medications  albuterol 108 (90 Base) MCG/ACT inhaler Commonly known as: VENTOLIN HFA Inhale 2 puffs into the lungs every 6 (six) hours as needed for wheezing or shortness of breath. What changed: Another medication with the same name was removed. Continue taking this medication, and follow the directions you see here.   amLODipine 5 MG tablet Commonly known as: NORVASC Take 1 tablet (5 mg total)  by mouth daily.   aspirin EC 81 MG tablet Take 81 mg by mouth daily.   budesonide-formoterol 160-4.5 MCG/ACT inhaler Commonly known as: SYMBICORT Inhale 2 puffs into the lungs 2 (two) times daily.   ferrous sulfate 325 (65 FE) MG EC tablet Take 1 tablet (325 mg total) by mouth 2 (two) times daily.   folic acid 1 MG tablet Commonly known as: FOLVITE Take 1 tablet (1 mg total) by mouth daily. Start taking on: Nov 24, 2022   lisinopril 20 MG tablet Commonly known as: ZESTRIL Take 1 tablet (20 mg total) by mouth daily.   montelukast 10 MG tablet Commonly known as: Singulair Take 1 tablet (10 mg total) by mouth at bedtime.   pantoprazole 40 MG tablet Commonly known as: Protonix Take 1 tablet (40 mg total) by mouth daily.   polyethylene glycol 17 g packet Commonly known as: MIRALAX / GLYCOLAX Take 17 g by mouth daily. Start taking on: Nov 24, 2022   thiamine 100 MG tablet Commonly known as: Vitamin B-1 Take 1 tablet (100 mg total) by mouth daily. Start taking on: Nov 24, 2022        Follow-up Information     Locklear, Rossie Muskrat, MD Follow up in 2 week(s).   Specialty: Gastroenterology Contact information: [address] [place] [address] [phone]                No Known Allergies  Consultations: Gastroenterology   Procedures/Studies: US ABDOMEN LIMITED RUQ (LIVER/GB)  Result Date: 11/22/2022 CLINICAL DATA:  Transaminitis. EXAM: ULTRASOUND ABDOMEN LIMITED RIGHT UPPER QUADRANT COMPARISON:  None Available. FINDINGS: Gallbladder: Multiple shadowing echogenic gallstones are seen within the dependent portion of the gallbladder lumen. The largest measures approximately 1.1 cm. There is no evidence of gallbladder wall thickening (1.9 mm) no sonographic Murphy sign noted by sonographer. Common bile duct: Diameter: 6.1 Liver: No focal lesion identified. The liver parenchyma is nodular in contour and within normal limits in parenchymal echogenicity.  Portal vein is patent on color Doppler imaging with normal direction of blood flow towards the liver. Other: None. IMPRESSION: 1. Cholelithiasis. 2. Findings suggestive of hepatic cirrhosis. Electronically Signed   By: Aram Candela M.D.   On: 11/22/2022 00:25   DG Chest 1 View  Result Date: 11/21/2022 CLINICAL DATA:  Altered mental status. EXAM: CHEST  1 VIEW COMPARISON:  Dec 06, 2019 FINDINGS: The heart size and mediastinal contours are within normal limits. There is stable tortuosity of the descending thoracic aorta. Both lungs are clear. Multilevel degenerative changes seen throughout the thoracic spine. IMPRESSION: No active disease. Electronically Signed   By: Aram Candela M.D.   On: 11/21/2022 23:43   CT HEAD WO CONTRAST  Result Date: 11/21/2022 CLINICAL DATA:  Status post fall with altered mental status. EXAM: CT HEAD WITHOUT CONTRAST TECHNIQUE: Contiguous axial images were obtained from the base of the skull through the vertex without intravenous contrast. RADIATION DOSE REDUCTION: This exam was performed according to the departmental dose-optimization program which includes automated exposure control, adjustment of the mA and/or kV according to patient size and/or use of  iterative reconstruction technique. COMPARISON:  August 21, 2019 FINDINGS: Brain: There is mild cerebral atrophy with widening of the extra-axial spaces and ventricular dilatation. There are areas of decreased attenuation within the white matter tracts of the supratentorial brain, consistent with microvascular disease changes. Very small, chronic, bilateral basal ganglia lacunar infarcts are noted. Vascular: No hyperdense vessel or unexpected calcification. Skull: Normal. Negative for fracture or focal lesion. Sinuses/Orbits: Chronic changes are seen involving the right maxillary sinus. Other: None. IMPRESSION: 1. No acute intracranial abnormality. 2. Generalized cerebral atrophy and microvascular disease changes of the  supratentorial brain. 3. Small, chronic, bilateral basal ganglia lacunar infarcts. 4. Chronic right maxillary sinus disease. Electronically Signed   By: Aram Candela M.D.   On: 11/21/2022 19:43     Subjective: Patient was seen and examined at bedside.  Overnight events noted.   Patient reports doing much better.  Denies any bleeding,  he is back to his baseline mental status and wants to be discharged.  Discharge Exam: Vitals:   11/23/22 1000 11/23/22 1049  BP: (!) 151/92   Pulse: (!) 113   Resp:    Temp:  98.1 F (36.7 C)  SpO2: 99%    Vitals:   11/23/22 0634 11/23/22 0800 11/23/22 1000 11/23/22 1049  BP: (!) 151/99 (!) 155/99 (!) 151/92   Pulse: (!) 118 60 (!) 113   Resp: 18 16    Temp: 98.2 F (36.8 C)   98.1 F (36.7 C)  TempSrc: Oral   Oral  SpO2: 100% 97% 99%   Weight: 86.2 kg     Height: 6\' 3"  (1.905 m)       General: Pt is alert, awake, not in acute distress Cardiovascular: RRR, S1/S2 +, no rubs, no gallops Respiratory: CTA bilaterally, no wheezing, no rhonchi Abdominal: Soft, NT, ND, bowel sounds + Extremities: no edema, no cyanosis    The results of significant diagnostics from this hospitalization (including imaging, microbiology, ancillary and laboratory) are listed below for reference.     Microbiology: No results found for this or any previous visit (from the past 240 hour(s)).   Labs: BNP (last 3 results) No results for input(s): "BNP" in the last 8760 hours. Basic Metabolic Panel: Recent Labs  Lab 11/21/22 1841 11/22/22 0650 11/23/22 0425  NA 137 141 135  K 3.5 3.4* 3.1*  CL 109 112* 104  CO2 21* 22 23  GLUCOSE 102* 124* 104*  BUN 8 8 10   CREATININE 0.67 0.76 0.67  CALCIUM 7.5* 7.7* 8.1*   Liver Function Tests: Recent Labs  Lab 11/21/22 1841 11/22/22 0650 11/23/22 0425  AST 199* 190* 141*  ALT 54* 51* 46*  ALKPHOS 127* 128* 132*  BILITOT 0.9 0.8 1.7*  PROT 9.1* 8.3* 9.0*  ALBUMIN 3.4* 3.0* 3.1*   No results for input(s):  "LIPASE", "AMYLASE" in the last 168 hours. Recent Labs  Lab 11/21/22 2313  AMMONIA 44*   CBC: Recent Labs  Lab 11/21/22 1841 11/22/22 0240 11/22/22 0650 11/23/22 0425  WBC 3.7*  --   --   --   NEUTROABS 1.1*  --   --   --   HGB 8.3* 8.3* 7.8* 8.0*  HCT 27.5*  --   --  26.7*  MCV 72.9*  --   --   --   PLT 145*  --   --   --    Cardiac Enzymes: No results for input(s): "CKTOTAL", "CKMB", "CKMBINDEX", "TROPONINI" in the last 168 hours. BNP: Invalid input(s): "POCBNP" CBG: Recent  Labs  Lab 11/21/22 1827  GLUCAP 99   D-Dimer No results for input(s): "DDIMER" in the last 72 hours. Hgb A1c No results for input(s): "HGBA1C" in the last 72 hours. Lipid Profile No results for input(s): "CHOL", "HDL", "LDLCALC", "TRIG", "CHOLHDL", "LDLDIRECT" in the last 72 hours. Thyroid function studies No results for input(s): "TSH", "T4TOTAL", "T3FREE", "THYROIDAB" in the last 72 hours.  Invalid input(s): "FREET3" Anemia work up Recent Labs    11/23/22 0425  VITAMINB12 284  FOLATE 11.4  FERRITIN 13*  TIBC 480*  IRON 117   Urinalysis    Component Value Date/Time   COLORURINE YELLOW (A) 11/21/2022 2342   APPEARANCEUR CLEAR 11/21/2022 2342   LABSPEC 1.010 11/21/2022 2342   PHURINE 5.0 11/21/2022 2342   GLUCOSEU NEGATIVE 11/21/2022 2342   HGBUR NEGATIVE 11/21/2022 2342   BILIRUBINUR NEGATIVE 11/21/2022 2342   KETONESUR NEGATIVE 11/21/2022 2342   PROTEINUR NEGATIVE 11/21/2022 2342   NITRITE NEGATIVE 11/21/2022 2342   LEUKOCYTESUR NEGATIVE 11/21/2022 2342   Sepsis Labs Recent Labs  Lab 11/21/22 1841  WBC 3.7*   Microbiology No results found for this or any previous visit (from the past 240 hour(s)).   Time coordinating discharge: Over 30 minutes  SIGNED:   Willeen Niece, MD  [facility] 11/23/2022, 2:59 PM Pager   If 7PM-7AM, please contact night-coverage

## 2022-11-24 NOTE — Consult Note (Signed)
[facility] Customer service manager [facility]) Accountable Care Organization (ACO) [facility] Liaison Note  11/24/2022  Joel Harrell 11/23/1955 [id number]  Location: [facility] Liaison screened the Joel Harrell remotely at [facility].  Insurance: Joel Harrell is a 67 y.o. male who is a Primary Care Joel Harrell of Joel Harrell, Joel Harrell. The Joel Harrell was screened for readmission hospitalization with noted low risk score for unplanned readmission risk with 0 IP/0 ED in 6 months.  The Joel Harrell was assessed for potential [facility]) Care Management service needs for post hospital transition for care coordination. Review of Joel Harrell's electronic medical record reveals Joel Harrell did not have a primary provider. [facility] liaison spoke with pt and offer [place] "Find A Doctor" [phone] hotline to assist with finding a primary provider. Pt very appreciative and requested his mediations be transferred from one pharmacy to another. Based upon pt not having a PCP to consult on this matter hospital liaison encouraged pt to call his GI provider Joel Harrell pending an upcoming appointment and confirmed on 12/08/2022 with this request (receptive). Joel other needs presented at this time. Pt is not eligible for [facility] services at this time based upon pt confirmation that he did not have a PCP at this time.    [facility] Care Management/Population Health does not replace or interfere with any arrangements made by the Inpatient Transition of Care team.   For questions contact:   Joel Cousin, RN, BSN [facility] Liaison [place]   [facility]  Population Health Office Hours MTWF 8:00 am to 6 pm off on Thursday [phone] mobile [phone] [Office toll free line][facility] Office Hours are M-F 8:30 - 5 pm 24 hour nurse advise line [phone] Conceirge  [email or web address]

## 2023-12-10 ENCOUNTER — Emergency Department

## 2023-12-10 ENCOUNTER — Inpatient Hospital Stay
Admission: EM | Admit: 2023-12-10 | Discharge: 2023-12-22 | DRG: 375 | Disposition: A | Discharge status: Home Health | Attending: Osteopathic Medicine | Admitting: Osteopathic Medicine

## 2023-12-10 ENCOUNTER — Other Ambulatory Visit: Payer: Self-pay

## 2023-12-10 DIAGNOSIS — Z7982 Long term (current) use of aspirin: Secondary | ICD-10-CM | POA: Diagnosis not present

## 2023-12-10 DIAGNOSIS — K922 Gastrointestinal hemorrhage, unspecified: Secondary | ICD-10-CM | POA: Diagnosis not present

## 2023-12-10 DIAGNOSIS — R338 Other retention of urine: Secondary | ICD-10-CM | POA: Diagnosis present

## 2023-12-10 DIAGNOSIS — K2971 Gastritis, unspecified, with bleeding: Secondary | ICD-10-CM

## 2023-12-10 DIAGNOSIS — N4889 Other specified disorders of penis: Secondary | ICD-10-CM | POA: Diagnosis present

## 2023-12-10 DIAGNOSIS — E663 Overweight: Secondary | ICD-10-CM | POA: Diagnosis present

## 2023-12-10 DIAGNOSIS — F10239 Alcohol dependence with withdrawal, unspecified: Secondary | ICD-10-CM | POA: Diagnosis present

## 2023-12-10 DIAGNOSIS — F1010 Alcohol abuse, uncomplicated: Secondary | ICD-10-CM | POA: Diagnosis not present

## 2023-12-10 DIAGNOSIS — D61818 Other pancytopenia: Secondary | ICD-10-CM | POA: Diagnosis present

## 2023-12-10 DIAGNOSIS — E876 Hypokalemia: Secondary | ICD-10-CM | POA: Diagnosis present

## 2023-12-10 DIAGNOSIS — E8809 Other disorders of plasma-protein metabolism, not elsewhere classified: Secondary | ICD-10-CM | POA: Diagnosis present

## 2023-12-10 DIAGNOSIS — D125 Benign neoplasm of sigmoid colon: Secondary | ICD-10-CM | POA: Diagnosis not present

## 2023-12-10 DIAGNOSIS — T502X5A Adverse effect of carbonic-anhydrase inhibitors, benzothiadiazides and other diuretics, initial encounter: Secondary | ICD-10-CM | POA: Diagnosis present

## 2023-12-10 DIAGNOSIS — R140 Abdominal distension (gaseous): Secondary | ICD-10-CM | POA: Diagnosis present

## 2023-12-10 DIAGNOSIS — K766 Portal hypertension: Secondary | ICD-10-CM | POA: Diagnosis present

## 2023-12-10 DIAGNOSIS — K2921 Alcoholic gastritis with bleeding: Secondary | ICD-10-CM | POA: Diagnosis not present

## 2023-12-10 DIAGNOSIS — D509 Iron deficiency anemia, unspecified: Secondary | ICD-10-CM | POA: Insufficient documentation

## 2023-12-10 DIAGNOSIS — C187 Malignant neoplasm of sigmoid colon: Secondary | ICD-10-CM | POA: Diagnosis not present

## 2023-12-10 DIAGNOSIS — D62 Acute posthemorrhagic anemia: Secondary | ICD-10-CM | POA: Diagnosis present

## 2023-12-10 DIAGNOSIS — Z515 Encounter for palliative care: Secondary | ICD-10-CM | POA: Diagnosis not present

## 2023-12-10 DIAGNOSIS — Z6829 Body mass index (BMI) 29.0-29.9, adult: Secondary | ICD-10-CM

## 2023-12-10 DIAGNOSIS — K7031 Alcoholic cirrhosis of liver with ascites: Secondary | ICD-10-CM | POA: Diagnosis present

## 2023-12-10 DIAGNOSIS — Z79899 Other long term (current) drug therapy: Secondary | ICD-10-CM

## 2023-12-10 DIAGNOSIS — D490 Neoplasm of unspecified behavior of digestive system: Secondary | ICD-10-CM

## 2023-12-10 DIAGNOSIS — Z8249 Family history of ischemic heart disease and other diseases of the circulatory system: Secondary | ICD-10-CM

## 2023-12-10 DIAGNOSIS — D123 Benign neoplasm of transverse colon: Secondary | ICD-10-CM | POA: Diagnosis present

## 2023-12-10 DIAGNOSIS — Z7951 Long term (current) use of inhaled steroids: Secondary | ICD-10-CM

## 2023-12-10 DIAGNOSIS — J449 Chronic obstructive pulmonary disease, unspecified: Secondary | ICD-10-CM | POA: Insufficient documentation

## 2023-12-10 DIAGNOSIS — Z7189 Other specified counseling: Secondary | ICD-10-CM | POA: Diagnosis not present

## 2023-12-10 DIAGNOSIS — E871 Hypo-osmolality and hyponatremia: Secondary | ICD-10-CM | POA: Diagnosis not present

## 2023-12-10 DIAGNOSIS — K3189 Other diseases of stomach and duodenum: Secondary | ICD-10-CM | POA: Diagnosis present

## 2023-12-10 DIAGNOSIS — D649 Anemia, unspecified: Principal | ICD-10-CM

## 2023-12-10 DIAGNOSIS — K2920 Alcoholic gastritis without bleeding: Secondary | ICD-10-CM | POA: Diagnosis present

## 2023-12-10 DIAGNOSIS — J4489 Other specified chronic obstructive pulmonary disease: Secondary | ICD-10-CM | POA: Diagnosis present

## 2023-12-10 DIAGNOSIS — D122 Benign neoplasm of ascending colon: Secondary | ICD-10-CM | POA: Diagnosis present

## 2023-12-10 DIAGNOSIS — I10 Essential (primary) hypertension: Secondary | ICD-10-CM

## 2023-12-10 DIAGNOSIS — C189 Malignant neoplasm of colon, unspecified: Secondary | ICD-10-CM | POA: Insufficient documentation

## 2023-12-10 DIAGNOSIS — N5089 Other specified disorders of the male genital organs: Secondary | ICD-10-CM | POA: Diagnosis present

## 2023-12-10 DIAGNOSIS — I8510 Secondary esophageal varices without bleeding: Secondary | ICD-10-CM | POA: Diagnosis present

## 2023-12-10 DIAGNOSIS — E222 Syndrome of inappropriate secretion of antidiuretic hormone: Secondary | ICD-10-CM | POA: Diagnosis present

## 2023-12-10 DIAGNOSIS — K6389 Other specified diseases of intestine: Secondary | ICD-10-CM | POA: Insufficient documentation

## 2023-12-10 DIAGNOSIS — N401 Enlarged prostate with lower urinary tract symptoms: Secondary | ICD-10-CM | POA: Diagnosis present

## 2023-12-10 DIAGNOSIS — Y903 Blood alcohol level of 60-79 mg/100 ml: Secondary | ICD-10-CM | POA: Diagnosis present

## 2023-12-10 DIAGNOSIS — D519 Vitamin B12 deficiency anemia, unspecified: Secondary | ICD-10-CM | POA: Diagnosis present

## 2023-12-10 DIAGNOSIS — D689 Coagulation defect, unspecified: Secondary | ICD-10-CM | POA: Diagnosis present

## 2023-12-10 DIAGNOSIS — R5381 Other malaise: Secondary | ICD-10-CM | POA: Diagnosis present

## 2023-12-10 DIAGNOSIS — K921 Melena: Secondary | ICD-10-CM | POA: Diagnosis not present

## 2023-12-10 LAB — CBC
HCT: 16.6 % — ABNORMAL LOW (ref 39.0–52.0)
Hemoglobin: 4.4 g/dL — CL (ref 13.0–17.0)
MCH: 16.9 pg — ABNORMAL LOW (ref 26.0–34.0)
MCHC: 26.5 g/dL — ABNORMAL LOW (ref 30.0–36.0)
MCV: 63.8 fL — ABNORMAL LOW (ref 80.0–100.0)
Platelets: 132 10*3/uL — ABNORMAL LOW (ref 150–400)
RBC: 2.6 MIL/uL — ABNORMAL LOW (ref 4.22–5.81)
RDW: 21.9 % — ABNORMAL HIGH (ref 11.5–15.5)
WBC: 3.6 10*3/uL — ABNORMAL LOW (ref 4.0–10.5)
nRBC: 0.6 % — ABNORMAL HIGH (ref 0.0–0.2)

## 2023-12-10 LAB — BASIC METABOLIC PANEL WITH GFR
Anion gap: 9 (ref 5–15)
BUN: 10 mg/dL (ref 8–23)
CO2: 21 mmol/L — ABNORMAL LOW (ref 22–32)
Calcium: 7.4 mg/dL — ABNORMAL LOW (ref 8.9–10.3)
Chloride: 99 mmol/L (ref 98–111)
Creatinine, Ser: 0.86 mg/dL (ref 0.61–1.24)
GFR, Estimated: >60 mL/min (ref >60)
Glucose, Bld: 106 mg/dL — ABNORMAL HIGH (ref 70–99)
Potassium: 3.5 mmol/L (ref 3.5–5.1)
Sodium: 129 mmol/L — ABNORMAL LOW (ref 135–145)

## 2023-12-10 LAB — HEPATIC FUNCTION PANEL
ALT: 15 U/L (ref 0–44)
AST: 31 U/L (ref 15–41)
Albumin: 2.3 g/dL — ABNORMAL LOW (ref 3.5–5.0)
Alkaline Phosphatase: 78 U/L (ref 38–126)
Bilirubin, Direct: 1.4 mg/dL — ABNORMAL HIGH (ref 0.0–0.2)
Indirect Bilirubin: 1.6 mg/dL — ABNORMAL HIGH (ref 0.3–0.9)
Total Bilirubin: 3 mg/dL — ABNORMAL HIGH (ref 0.0–1.2)
Total Protein: 7.4 g/dL (ref 6.5–8.1)

## 2023-12-10 LAB — BRAIN NATRIURETIC PEPTIDE: B Natriuretic Peptide: 161.2 pg/mL — ABNORMAL HIGH (ref 0.0–100.0)

## 2023-12-10 LAB — PROTIME-INR
INR: 1.8 — ABNORMAL HIGH (ref 0.8–1.2)
Prothrombin Time: 21.5 s — ABNORMAL HIGH (ref 11.4–15.2)

## 2023-12-10 LAB — ETHANOL: Alcohol, Ethyl (B): 79 mg/dL — ABNORMAL HIGH (ref <15)

## 2023-12-10 LAB — PREPARE RBC (CROSSMATCH)

## 2023-12-10 LAB — LIPASE, BLOOD: Lipase: 63 U/L — ABNORMAL HIGH (ref 11–51)

## 2023-12-10 LAB — AMMONIA: Ammonia: 20 umol/L (ref 9–35)

## 2023-12-10 LAB — MAGNESIUM: Magnesium: 1.5 mg/dL — ABNORMAL LOW (ref 1.7–2.4)

## 2023-12-10 MED ORDER — IPRATROPIUM-ALBUTEROL 0.5-2.5 (3) MG/3ML IN SOLN
3.0000 mL | Freq: Once | RESPIRATORY_TRACT | Status: AC
  Administered 2023-12-10: 3 mL via RESPIRATORY_TRACT
  Filled 2023-12-10: qty 3

## 2023-12-10 MED ORDER — LORAZEPAM 2 MG/ML IJ SOLN: 0.0000 mg | Freq: Four times a day (QID) | INTRAMUSCULAR | Status: DC

## 2023-12-10 MED ORDER — MAGNESIUM SULFATE 2 GM/50ML IV SOLN
2.0000 g | Freq: Once | INTRAVENOUS | Status: AC
  Administered 2023-12-10: 2 g via INTRAVENOUS
  Filled 2023-12-10: qty 50

## 2023-12-10 MED ORDER — THIAMINE HCL 100 MG/ML IJ SOLN: 100.0000 mg | Freq: Every day | INTRAMUSCULAR | Status: DC

## 2023-12-10 MED ORDER — PANTOPRAZOLE SODIUM 40 MG IV SOLR
40.0000 mg | Freq: Once | INTRAVENOUS | Status: AC
  Administered 2023-12-10: 40 mg via INTRAVENOUS
  Filled 2023-12-10: qty 10

## 2023-12-10 MED ORDER — SODIUM CHLORIDE 0.9 % IV SOLN
10.0000 mL/h | Freq: Once | INTRAVENOUS | Status: AC
  Administered 2023-12-10: 10 mL/h via INTRAVENOUS

## 2023-12-10 NOTE — ED Provider Notes (Signed)
 [facility] Provider Note    Event Date/Time   First MD Initiated Contact with Patient 12/10/23 2257     (approximate)   History   Leg Swelling   HPI  Joel Harrell is a 68 y.o. male brought to the ED via EMS from home with a chief complaint of swelling in his legs, abdomen and scrotum x 1 week.  Reports he is out of his blood pressure medications x 4 days.  Endorses drinking 12 pack of beer per week although family states per day plus liquor.  Endorses shortness of breath especially on laying supine.  History of alcohol use disorder, decompensated hepatic cirrhosis, hepatic encephalopathy, COPD not on home oxygen and hypertension.     Past Medical History   Past Medical History:  Diagnosis Date   Asthma    CHF (congestive heart failure) (HCC)    Hypertension      Active Problem List   Patient Active Problem List   Diagnosis Date Noted   Acute hepatic encephalopathy (HCC) 11/22/2022   Iron deficiency anemia due to chronic blood loss 11/22/2022   Decompensated hepatic cirrhosis (HCC) 11/22/2022   Alcohol use disorder 11/22/2022   Hypertension 11/22/2022   COPD (chronic obstructive pulmonary disease) (HCC) 11/22/2022     Past Surgical History  History reviewed. No pertinent surgical history.   Home Medications   Prior to Admission medications   Medication Sig Start Date End Date Taking? Authorizing Provider  albuterol  (VENTOLIN  HFA) 108 (90 Base) MCG/ACT inhaler Inhale 2 puffs into the lungs every 6 (six) hours as needed for wheezing or shortness of breath. Patient not taking: Reported on 11/22/2022 12/07/19   Lynnda Sas, MD  amLODipine  (NORVASC ) 5 MG tablet Take 1 tablet (5 mg total) by mouth daily. 11/23/22 05/22/23  Magdalene School, MD  aspirin  81 MG EC tablet Take 81 mg by mouth daily.      [provider]  budesonide -formoterol  (SYMBICORT ) 160-4.5 MCG/ACT inhaler Inhale 2 puffs into the lungs 2 (two) times daily. Patient not  taking: Reported on 11/22/2022 12/05/18   Bryson Carbine, MD  ferrous sulfate  325 (65 FE) MG EC tablet Take 1 tablet (325 mg total) by mouth 2 (two) times daily. 11/23/22 11/23/23  Magdalene School, MD  folic acid  (FOLVITE ) 1 MG tablet Take 1 tablet (1 mg total) by mouth daily. 11/24/22   Magdalene School, MD  lisinopril  (ZESTRIL ) 20 MG tablet Take 1 tablet (20 mg total) by mouth daily. 11/23/22 02/21/23  Magdalene School, MD  montelukast  (SINGULAIR ) 10 MG tablet Take 1 tablet (10 mg total) by mouth at bedtime. 12/28/15 12/27/16  Arlan Labella, MD  pantoprazole  (PROTONIX ) 40 MG tablet Take 1 tablet (40 mg total) by mouth daily. 11/23/22 11/23/23  Magdalene School, MD  polyethylene glycol (MIRALAX  / GLYCOLAX ) 17 g packet Take 17 g by mouth daily. 11/24/22   Magdalene School, MD  thiamine  (VITAMIN B-1) 100 MG tablet Take 1 tablet (100 mg total) by mouth daily. 11/24/22   Magdalene School, MD     Allergies  Patient has no known allergies.   Family History   Family History  Problem Relation Age of Onset   Heart attack Father    Cancer Mother      Physical Exam  Triage Vital Signs: ED Triage Vitals  Encounter Vitals Group     BP 12/10/23 2130 (!) 145/92     Systolic BP Percentile --      Diastolic BP Percentile --  Pulse Rate 12/10/23 2130 (!) 112     Resp 12/10/23 2146 (!) 23     Temp 12/10/23 2145 97.8 F (36.6 C)     Temp Source 12/10/23 2145 Oral     SpO2 12/10/23 2130 100 %     Weight 12/10/23 2142 249 lb 8 oz (113.2 kg)     Height 12/10/23 2142 6\' 3"  (1.905 m)     Head Circumference --      Peak Flow --      Pain Score --      Pain Loc --      Pain Education --      Exclude from Growth Chart --     Updated Vital Signs: BP (!) 149/70   Pulse (!) 113   Temp 97.8 F (36.6 C) (Oral)   Resp 20   Ht 6\' 3"  (1.905 m)   Wt 113.2 kg   SpO2 100%   BMI 31.19 kg/m    General: Awake, mild distress.  Pale. CV:  Tachycardic.  Good peripheral perfusion.  Resp:  Increased effort.   Diminished aeration otherwise CTAB. Abd:  Ascites.  Nontender.  Moderate distention.  Other:  2+ BLE pitting edema.   ED Results / Procedures / Treatments  Labs (all labs ordered are listed, but only abnormal results are displayed) Labs Reviewed  BASIC METABOLIC PANEL WITH GFR - Abnormal; Notable for the following components:      Result Value   Sodium 129 (*)    CO2 21 (*)    Glucose, Bld 106 (*)    Calcium 7.4 (*)    All other components within normal limits  MAGNESIUM  - Abnormal; Notable for the following components:   Magnesium  1.5 (*)    All other components within normal limits  BRAIN NATRIURETIC PEPTIDE - Abnormal; Notable for the following components:   B Natriuretic Peptide 161.2 (*)    All other components within normal limits  CBC - Abnormal; Notable for the following components:   WBC 3.6 (*)    RBC 2.60 (*)    Hemoglobin 4.4 (*)    HCT 16.6 (*)    MCV 63.8 (*)    MCH 16.9 (*)    MCHC 26.5 (*)    RDW 21.9 (*)    Platelets 132 (*)    nRBC 0.6 (*)    All other components within normal limits  PROTIME-INR - Abnormal; Notable for the following components:   Prothrombin Time 21.5 (*)    INR 1.8 (*)    All other components within normal limits  BRAIN NATRIURETIC PEPTIDE  DIGOXIN LEVEL  ETHANOL  HEPATIC FUNCTION PANEL  LIPASE, BLOOD  AMMONIA  TYPE AND SCREEN  PREPARE RBC (CROSSMATCH)  TROPONIN I (HIGH SENSITIVITY)     EKG  ED ECG REPORT I, Christinea Brizuela J, the attending physician, personally viewed and interpreted this ECG.   Date: 12/10/2023  EKG Time: 2139  Rate: 109  Rhythm: sinus tachycardia  Axis: Normal  Intervals:none  ST&T Change: Nonspecific    RADIOLOGY I have independently visualized interpreted patient's imaging study as well as noted the radiology interpretation:  Chest x-ray: No acute cardiopulmonary process  Official radiology report(s): DG Chest Portable 1 View Result Date: 12/10/2023 CLINICAL DATA:  Chest pain EXAM: PORTABLE  CHEST 1 VIEW COMPARISON:  None Available. FINDINGS: Mild right basilar atelectasis. Lungs are otherwise clear. No pneumothorax or pleural effusion. Cardiac size within normal limits. Pulmonary vascularity is normal. Osseous structures are age-appropriate. No acute bone abnormality.  IMPRESSION: No active disease. Electronically Signed   By: Worthy Heads M.D.   On: 12/10/2023 22:20     PROCEDURES:  Critical Care performed: Yes, see critical care procedure note(s)  CRITICAL CARE Performed by: Norlene Beavers   Total critical care time: 30 minutes  Critical care time was exclusive of separately billable procedures and treating other patients.  Critical care was necessary to treat or prevent imminent or life-threatening deterioration.  Critical care was time spent personally by me on the following activities: development of treatment plan with patient and/or surrogate as well as nursing, discussions with consultants, evaluation of patient's response to treatment, examination of patient, obtaining history from patient or surrogate, ordering and performing treatments and interventions, ordering and review of laboratory studies, ordering and review of radiographic studies, pulse oximetry and re-evaluation of patient's condition.   Aaron Aas1-3 Lead EKG Interpretation  Performed by: Norlene Beavers, MD Authorized by: Norlene Beavers, MD     Interpretation: abnormal     ECG rate:  109   ECG rate assessment: tachycardic     Rhythm: sinus tachycardia     Ectopy: none     Conduction: normal   Comments:     Patient placed on cardiac monitor to evaluate for arrhythmias  Rectal exam: External exam unremarkable.  Tan stool obtained on gloved finger which is trace guaiac positive.   MEDICATIONS ORDERED IN ED: Medications  0.9 %  sodium chloride  infusion (has no administration in time range)  magnesium  sulfate IVPB 2 g 50 mL (2 g Intravenous New Bag/Given 12/10/23 2351)  LORazepam  (ATIVAN ) injection 0-4 mg (has  no administration in time range)  thiamine  (VITAMIN B1) injection 100 mg (has no administration in time range)  ipratropium-albuterol  (DUONEB) 0.5-2.5 (3) MG/3ML nebulizer solution 3 mL (3 mLs Nebulization Given 12/10/23 2302)  pantoprazole  (PROTONIX ) injection 40 mg (40 mg Intravenous Given 12/10/23 2347)     IMPRESSION / MDM / ASSESSMENT AND PLAN / ED COURSE  I reviewed the triage vital signs and the nursing notes.                             68 year old male presenting with swelling to his trunk and legs. Differential diagnosis includes, but is not limited to, biliary disease (biliary colic, acute cholecystitis, cholangitis, choledocholithiasis, etc), intrathoracic causes for epigastric abdominal pain including ACS, gastritis, duodenitis, pancreatitis, small bowel or large bowel obstruction, abdominal aortic aneurysm, hernia, and ulcer(s).  I personally reviewed patient's records and note a hospitalization for hepatic encephalopathy and upper GI bleed from 11/21/2022.  Patient's presentation is most consistent with acute presentation with potential threat to life or bodily function.  The patient is on the cardiac monitor to evaluate for evidence of arrhythmia and/or significant heart rate changes.  Laboratory results significant for hemoglobin 4.4, magnesium  1.5.  Patient consents to blood transfusion.  Add Protonix  IV.  Placed on CIWA, replete magnesium .  Will consult hospitalist services for evaluation and admission.    FINAL CLINICAL IMPRESSION(S) / ED DIAGNOSES   Final diagnoses:  Anemia, unspecified type  Ascites due to alcoholic cirrhosis (HCC)  Gastrointestinal hemorrhage associated with alcoholic gastritis  Hypomagnesemia     Rx / DC Orders   ED Discharge Orders     None        Note:  This document was prepared using Dragon voice recognition software and may include unintentional dictation errors.   Eliah Marquard J, MD 12/11/23 [phone]

## 2023-12-10 NOTE — ED Triage Notes (Addendum)
 Pt coming from  home via EMS d/t swelling in his legs, abdomen, and swelling to the glans penis "little over 1 week". Pt sts he has been out bp meds fore 4 days. Pt sts he is having trouble urinating d/t the swelling to his penis. Pt sts he drink approx 12 pack of beer a week and 1-2 mini bottle liquor a day with last mini bottle today around 12am

## 2023-12-11 DIAGNOSIS — K921 Melena: Secondary | ICD-10-CM

## 2023-12-11 DIAGNOSIS — K7031 Alcoholic cirrhosis of liver with ascites: Secondary | ICD-10-CM | POA: Insufficient documentation

## 2023-12-11 DIAGNOSIS — D509 Iron deficiency anemia, unspecified: Secondary | ICD-10-CM | POA: Insufficient documentation

## 2023-12-11 DIAGNOSIS — F1010 Alcohol abuse, uncomplicated: Secondary | ICD-10-CM

## 2023-12-11 DIAGNOSIS — J449 Chronic obstructive pulmonary disease, unspecified: Secondary | ICD-10-CM | POA: Insufficient documentation

## 2023-12-11 DIAGNOSIS — I10 Essential (primary) hypertension: Secondary | ICD-10-CM | POA: Insufficient documentation

## 2023-12-11 DIAGNOSIS — D62 Acute posthemorrhagic anemia: Secondary | ICD-10-CM | POA: Insufficient documentation

## 2023-12-11 DIAGNOSIS — D61818 Other pancytopenia: Secondary | ICD-10-CM | POA: Insufficient documentation

## 2023-12-11 LAB — PREPARE RBC (CROSSMATCH)

## 2023-12-11 LAB — CBC
HCT: 16.1 % — ABNORMAL LOW (ref 39.0–52.0)
Hemoglobin: 4.2 g/dL — CL (ref 13.0–17.0)
MCH: 16.7 pg — ABNORMAL LOW (ref 26.0–34.0)
MCHC: 26.1 g/dL — ABNORMAL LOW (ref 30.0–36.0)
MCV: 63.9 fL — ABNORMAL LOW (ref 80.0–100.0)
Platelets: 125 10*3/uL — ABNORMAL LOW (ref 150–400)
RBC: 2.52 MIL/uL — ABNORMAL LOW (ref 4.22–5.81)
RDW: 22.2 % — ABNORMAL HIGH (ref 11.5–15.5)
WBC: 3.9 10*3/uL — ABNORMAL LOW (ref 4.0–10.5)
nRBC: 0 % (ref 0.0–0.2)

## 2023-12-11 LAB — BASIC METABOLIC PANEL WITH GFR
Anion gap: 3 — ABNORMAL LOW (ref 5–15)
BUN: 9 mg/dL (ref 8–23)
CO2: 26 mmol/L (ref 22–32)
Calcium: 7.3 mg/dL — ABNORMAL LOW (ref 8.9–10.3)
Chloride: 100 mmol/L (ref 98–111)
Creatinine, Ser: 0.77 mg/dL (ref 0.61–1.24)
GFR, Estimated: >60 mL/min (ref >60)
Glucose, Bld: 102 mg/dL — ABNORMAL HIGH (ref 70–99)
Potassium: 3.2 mmol/L — ABNORMAL LOW (ref 3.5–5.1)
Sodium: 129 mmol/L — ABNORMAL LOW (ref 135–145)

## 2023-12-11 LAB — IRON AND TIBC
Iron: 15 ug/dL — ABNORMAL LOW (ref 45–182)
TIBC: 293 ug/dL (ref 250–450)

## 2023-12-11 LAB — HEMOGLOBIN AND HEMATOCRIT, BLOOD
HCT: 19.5 % — ABNORMAL LOW (ref 39.0–52.0)
HCT: 22.3 % — ABNORMAL LOW (ref 39.0–52.0)
HCT: 24.9 % — ABNORMAL LOW (ref 39.0–52.0)
Hemoglobin: 5.5 g/dL — ABNORMAL LOW (ref 13.0–17.0)
Hemoglobin: 6.4 g/dL — ABNORMAL LOW (ref 13.0–17.0)
Hemoglobin: 6.8 g/dL — ABNORMAL LOW (ref 13.0–17.0)

## 2023-12-11 LAB — DIGOXIN LEVEL: Digoxin Level: <0.2 ng/mL — ABNORMAL LOW (ref 0.8–2.0)

## 2023-12-11 LAB — TROPONIN I (HIGH SENSITIVITY)
Troponin I (High Sensitivity): 4 ng/L (ref <18)
Troponin I (High Sensitivity): 5 ng/L (ref <18)

## 2023-12-11 LAB — VITAMIN B12: Vitamin B-12: 403 pg/mL (ref 180–914)

## 2023-12-11 LAB — FERRITIN: Ferritin: 8 ng/mL — ABNORMAL LOW (ref 24–336)

## 2023-12-11 LAB — HIV ANTIBODY (ROUTINE TESTING W REFLEX): HIV Screen 4th Generation wRfx: NONREACTIVE

## 2023-12-11 LAB — BRAIN NATRIURETIC PEPTIDE: B Natriuretic Peptide: 135.8 pg/mL — ABNORMAL HIGH (ref 0.0–100.0)

## 2023-12-11 MED ORDER — CYANOCOBALAMIN 1000 MCG/ML IJ SOLN
1000.0000 ug | Freq: Once | INTRAMUSCULAR | Status: AC
  Administered 2023-12-11: 1000 ug via INTRAMUSCULAR
  Filled 2023-12-11: qty 1

## 2023-12-11 MED ORDER — SODIUM CHLORIDE 0.9% IV SOLUTION: Freq: Once | INTRAVENOUS | Status: AC

## 2023-12-11 MED ORDER — FUROSEMIDE 10 MG/ML IJ SOLN
40.0000 mg | Freq: Once | INTRAMUSCULAR | Status: AC
  Administered 2023-12-11: 40 mg via INTRAVENOUS
  Filled 2023-12-11: qty 4

## 2023-12-11 MED ORDER — POTASSIUM CHLORIDE 20 MEQ PO PACK
40.0000 meq | PACK | Freq: Once | ORAL | Status: DC
  Filled 2023-12-11: qty 2

## 2023-12-11 MED ORDER — FOLIC ACID 1 MG PO TABS
1.0000 mg | ORAL_TABLET | Freq: Every day | ORAL | Status: DC
  Administered 2023-12-11 – 2023-12-22 (×12): 1 mg via ORAL
  Filled 2023-12-11 (×12): qty 1

## 2023-12-11 MED ORDER — SODIUM CHLORIDE 0.9 % IV SOLN
50.0000 ug/h | INTRAVENOUS | Status: DC
  Administered 2023-12-11 – 2023-12-12 (×4): 50 ug/h via INTRAVENOUS
  Filled 2023-12-11 (×6): qty 1

## 2023-12-11 MED ORDER — LISINOPRIL 20 MG PO TABS
20.0000 mg | ORAL_TABLET | Freq: Every day | ORAL | Status: DC
  Administered 2023-12-11 – 2023-12-15 (×5): 20 mg via ORAL
  Filled 2023-12-11 (×5): qty 1

## 2023-12-11 MED ORDER — POTASSIUM CHLORIDE 10 MEQ/100ML IV SOLN
10.0000 meq | INTRAVENOUS | Status: AC
  Administered 2023-12-11 (×2): 10 meq via INTRAVENOUS
  Filled 2023-12-11 (×2): qty 100

## 2023-12-11 MED ORDER — POTASSIUM CHLORIDE 10 MEQ/100ML IV SOLN
10.0000 meq | Freq: Once | INTRAVENOUS | Status: AC
  Administered 2023-12-11 (×2): 10 meq via INTRAVENOUS
  Filled 2023-12-11: qty 100

## 2023-12-11 MED ORDER — SODIUM CHLORIDE 0.9 % IV SOLN
1.0000 g | INTRAVENOUS | Status: DC
  Administered 2023-12-11: 1 g via INTRAVENOUS
  Filled 2023-12-11 (×2): qty 10

## 2023-12-11 MED ORDER — TRAZODONE HCL 50 MG PO TABS
25.0000 mg | ORAL_TABLET | Freq: Every evening | ORAL | Status: DC | PRN
Start: 2023-12-11 — End: 2023-12-22

## 2023-12-11 MED ORDER — AMLODIPINE BESYLATE 5 MG PO TABS
5.0000 mg | ORAL_TABLET | Freq: Every day | ORAL | Status: DC
  Administered 2023-12-11 – 2023-12-15 (×5): 5 mg via ORAL
  Filled 2023-12-11 (×5): qty 1

## 2023-12-11 MED ORDER — ACETAMINOPHEN 650 MG RE SUPP
650.0000 mg | Freq: Four times a day (QID) | RECTAL | Status: DC | PRN
Start: 2023-12-11 — End: 2023-12-22

## 2023-12-11 MED ORDER — ONDANSETRON HCL 4 MG PO TABS: 4.0000 mg | ORAL_TABLET | Freq: Four times a day (QID) | ORAL | Status: DC | PRN

## 2023-12-11 MED ORDER — PANTOPRAZOLE SODIUM 40 MG IV SOLR
40.0000 mg | Freq: Two times a day (BID) | INTRAVENOUS | Status: DC
  Administered 2023-12-11 – 2023-12-14 (×7): 40 mg via INTRAVENOUS
  Filled 2023-12-11 (×7): qty 10

## 2023-12-11 MED ORDER — IRON SUCROSE 300 MG IVPB - SIMPLE MED
300.0000 mg | Freq: Once | Status: DC
  Filled 2023-12-11: qty 265

## 2023-12-11 MED ORDER — FERROUS SULFATE 325 (65 FE) MG PO TABS
325.0000 mg | ORAL_TABLET | Freq: Two times a day (BID) | ORAL | Status: DC
  Administered 2023-12-11: 325 mg via ORAL
  Filled 2023-12-11: qty 1

## 2023-12-11 MED ORDER — ALBUTEROL SULFATE (2.5 MG/3ML) 0.083% IN NEBU
3.0000 mL | INHALATION_SOLUTION | RESPIRATORY_TRACT | Status: DC | PRN
  Administered 2023-12-11 – 2023-12-20 (×9): 3 mL via RESPIRATORY_TRACT
  Filled 2023-12-11 (×7): qty 3

## 2023-12-11 MED ORDER — OCTREOTIDE LOAD VIA INFUSION
50.0000 ug | Freq: Once | INTRAVENOUS | Status: AC
  Administered 2023-12-11: 50 ug via INTRAVENOUS
  Filled 2023-12-11: qty 25

## 2023-12-11 MED ORDER — POTASSIUM CHLORIDE CRYS ER 20 MEQ PO TBCR: 40.0000 meq | EXTENDED_RELEASE_TABLET | ORAL | Status: DC

## 2023-12-11 MED ORDER — ONDANSETRON HCL 4 MG/2ML IJ SOLN: 4.0000 mg | Freq: Four times a day (QID) | INTRAMUSCULAR | Status: DC | PRN

## 2023-12-11 MED ORDER — ACETAMINOPHEN 325 MG PO TABS
650.0000 mg | ORAL_TABLET | Freq: Four times a day (QID) | ORAL | Status: DC | PRN
  Administered 2023-12-11 – 2023-12-20 (×6): 650 mg via ORAL
  Filled 2023-12-11 (×7): qty 2

## 2023-12-11 MED ORDER — THIAMINE MONONITRATE 100 MG PO TABS
100.0000 mg | ORAL_TABLET | Freq: Every day | ORAL | Status: DC
  Administered 2023-12-11 – 2023-12-22 (×12): 100 mg via ORAL
  Filled 2023-12-11 (×12): qty 1

## 2023-12-11 MED ORDER — MONTELUKAST SODIUM 10 MG PO TABS
10.0000 mg | ORAL_TABLET | Freq: Every day | ORAL | Status: DC
  Administered 2023-12-11 – 2023-12-21 (×11): 10 mg via ORAL
  Filled 2023-12-11 (×11): qty 1

## 2023-12-11 MED ORDER — IRON SUCROSE 300 MG IVPB - SIMPLE MED
300.0000 mg | Freq: Once | Status: AC
  Administered 2023-12-11: 300 mg via INTRAVENOUS
  Filled 2023-12-11: qty 265

## 2023-12-11 MED ORDER — MAGNESIUM HYDROXIDE 400 MG/5ML PO SUSP: 30.0000 mL | Freq: Every day | ORAL | Status: DC | PRN

## 2023-12-11 NOTE — Plan of Care (Signed)
   Problem: Education: Goal: Knowledge of General Education information will improve Description Including pain rating scale, medication(s)/side effects and non-pharmacologic comfort measures Outcome: Progressing

## 2023-12-11 NOTE — Assessment & Plan Note (Signed)
-   This is associated with liver cell failure and likely ascites with anasarca likely secondary to hypoalbuminemia.. - GI consult to be obtained as mentioned above. - He can be gently diuresed after PRBCs transfusion.

## 2023-12-11 NOTE — Hospital Course (Addendum)
    Hospital course / significant events:  Joel Harrell is a 68 y.o. male with medical history significant for asthma, CHF, hypertension, alcoholic liver cirrhosis, and COPD, who presented to the emergency room with acute onset of bilateral lower extremity edema as well as abdominal distention with scrotal edema worsening over the last week. He admitted to drinking 12 pack beer per week although his family stated that he also drinks liquor. Patient came in with hemoglobin of 4.3, initially received 4 units of PRBC and IV iron . Patient states that he had a black stool a month ago, since then, he had been having brown stools every day. Due to liver cirrhosis, patient was given Protonix , octreotide , and PRBC.  GI consult obtained.  EGD showed portal hypertension gastropathy, esophageal varices.  No active bleeding. Colonoscopy was performed 6/2, identified a mass in the sigmoid colon, with oozing blood.  Biopsy results came back with colon cancer.  Medical and Radiation oncology consulted. Dr. Jeane Miguel (hospitalist) contacted [facility], tried to transfer to surgery service.  However, transfer was declined, they do not believe patient can survive surgery.  They recommended palliative care versus hospice.     Consultants:  Gastroenterology Urology Medical oncology Radiation oncology Oncology palliative care  Interventional radiology   Procedures/Surgeries: 06/01: EGD 06/02: colonoscopy w/ biopsy mass  06/02: mesentery angiogram, did not show any active bleeding. 06/03: paracentesis       ASSESSMENT & PLAN:   Acute lower GI bleeding secondary to colon cancer.   oncology and radiation oncology following Continue radiation Poor surgical candidate    Acute blood loss anemia secondary to GI bleed. Iron  deficient anemia.   Continue iron  Monitor CBC and bleeding outpatient   Pancytopenia secondary to liver cirrhosis. Alcoholic cirrhosis of liver with ascites  Portal hypertension  gastropathy.   Esophageal varices. Alcohol abuse. thiamine  and folic acid  outpatient f/u with GI (consulted GI and saw Dr. Mamie Searles during this hospitalization)   Anasarca  severe swelling in penis, scrotum, abdomen and BLE. Likely d/t liver disease, hypoalbuminemia  Continue lasix  outpatient     Hyponatremia  expect will be chornic given hyervolemia w/ liver/hypoalbuminemia   Hypokalemia. Hypomagnesemia. Hypophosphatemia monitor and supplement PRN   Urinary retention secondary to benign prostate hypertrophy and scrotal edema. Foley catheter was anchored by urology after presentation. Voiding trial successful and pt states would not want to go home on Foley anyway Seek care if concern for retention   Essential hypertension hold amlodipine  and Lisinopril  - BP low and also diuresing   Chronic obstructive pulmonary disease (COPD) Resume home meds

## 2023-12-11 NOTE — Assessment & Plan Note (Addendum)
-   She was counseled for alcohol cessation. - Will place him on CIWA protocol with IV Ativan  for alcohol withdrawal. - Will continue his thiamine , folate and multivitamins.

## 2023-12-11 NOTE — Assessment & Plan Note (Signed)
Will continue antihypertensive therapy.

## 2023-12-11 NOTE — H&P (View-Only) (Signed)
 Inpatient Consultation   Patient ID: Joel Harrell is a 68 y.o. male.  Requesting Provider: Amalia Jung, MD  Date of Admission: 12/10/2023  Date of Consult: 12/11/23   Reason for Consultation: Anemia, alcoholic cirrhosis  Patient's Chief Complaint:   Chief Complaint  Patient presents with   Leg Swelling    68 year old male with alcoholic cirrhosis, hepatitis C antibody positive unknown status, chronic anemia, alcohol dependence who presents to the hospital with worsening shortness of breath fatigue weakness and found to have worsening anemia.  GI is consulted for anemia and concern for GI bleeding.  Patient is a limited historian.  There is a question of confabulation.  He other downplays his alcohol intake or is unaware of this.  He does report he drinks 1 beer or 1 mini bottle per day, but previous notes report for more than this.  He has noted increasing swelling in his genitals, legs and abdomen. Increased sob including orthopnea. Hemoglobin on presentation was 4.4.  Last year it was 8.  He has only received 1 unit from the emergency department and is scheduled to see tomorrow on the floor.  ED providers exam demonstrated brown stool with trace guaiac positive.  Patient reported one-time dark stool a month ago.  No hematochezia hematemesis or coffee-ground emesis.  He reports a long as he has gone without an alcoholic beverages 1 week.  He denies any previous withdrawal symptoms such as seizures or hallucinations.  Denies NSAIDs, Anti-plt agents, and anticoagulants Denies family history of gastrointestinal disease and malignancy Previous Endoscopies: None    68 year old male with alcoholic cirrhosis, hepatitis C antibody positive unknown status, chronic anemia, alcohol dependence who presents to the hospital with worsening shortness of breath fatigue weakness and found to have worsening anemia.  GI is consulted for anemia and concern for GI bleeding.  Patient is a limited  historian.  There is a question of confabulation.  He other downplays his alcohol intake or is unaware of this.  He does report he drinks 1 beer or 1 mini bottle per day, but previous notes report for more than this.  He has noted increasing swelling in his genitals, legs and abdomen. Increased sob including orthopnea. Hemoglobin on presentation was 4.4.  Last year it was 8.  He has only received 1 unit from the emergency department and is scheduled to see tomorrow on the floor.  ED providers exam demonstrated brown stool with trace guaiac positive.  Patient reported one-time dark stool a month ago.  No hematochezia hematemesis or coffee-ground emesis.  He reports a long as he has gone without an alcoholic beverages 1 week.  He denies any previous withdrawal symptoms such as seizures or hallucinations.  Denies NSAIDs, Anti-plt agents, and anticoagulants Denies family history of gastrointestinal disease and malignancy Previous Endoscopies: None  Past Medical History:  Diagnosis Date   Asthma    CHF (congestive heart failure) (HCC)    Hypertension     History reviewed. No pertinent surgical history.  No Known Allergies  Family History  Problem Relation Age of Onset   Heart attack Father    Cancer Mother     Social History   Tobacco Use   Smoking status: Never   Smokeless tobacco: Never  Vaping Use   Vaping status: Never Used  Substance Use Topics   Alcohol use: Yes    Alcohol/week: 10.0 standard drinks of alcohol    Types: 10 Cans of beer per week   Drug use: No  Pertinent GI related history and allergies were reviewed with the patient  Review of Systems  Constitutional:  Positive for activity change, appetite change and fatigue. Negative for chills, diaphoresis, fever and unexpected weight change.  HENT:  Negative for trouble swallowing and voice change.   Respiratory:  Positive for shortness of breath. Negative for wheezing.   Cardiovascular:  Positive for leg  swelling. Negative for chest pain and palpitations.  Gastrointestinal:  Positive for abdominal distention. Negative for abdominal pain, anal bleeding, blood in stool, constipation, diarrhea, nausea and vomiting.  Genitourinary:  Positive for penile swelling.  Skin:  Negative for color change and pallor.  Neurological:  Positive for weakness. Negative for dizziness and syncope.  All other systems reviewed and are negative.    Medications Home Medications No current facility-administered medications on file prior to encounter.   Current Outpatient Medications on File Prior to Encounter  Medication Sig Dispense Refill   aspirin  81 MG EC tablet Take 81 mg by mouth daily.       folic acid  (FOLVITE ) 1 MG tablet Take 1 tablet (1 mg total) by mouth daily. 30 tablet 1   lisinopril  (ZESTRIL ) 20 MG tablet Take 1 tablet (20 mg total) by mouth daily. 90 tablet 0   albuterol  (VENTOLIN  HFA) 108 (90 Base) MCG/ACT inhaler Inhale 2 puffs into the lungs every 6 (six) hours as needed for wheezing or shortness of breath. (Patient not taking: Reported on 11/22/2022) 6.7 g 2   amLODipine  (NORVASC ) 5 MG tablet Take 1 tablet (5 mg total) by mouth daily. 90 tablet 1   budesonide -formoterol  (SYMBICORT ) 160-4.5 MCG/ACT inhaler Inhale 2 puffs into the lungs 2 (two) times daily. (Patient not taking: Reported on 11/22/2022) 1 Inhaler 1   ferrous sulfate  325 (65 FE) MG EC tablet Take 1 tablet (325 mg total) by mouth 2 (two) times daily. 60 tablet 3   montelukast  (SINGULAIR ) 10 MG tablet Take 1 tablet (10 mg total) by mouth at bedtime. 30 tablet 2   pantoprazole  (PROTONIX ) 40 MG tablet Take 1 tablet (40 mg total) by mouth daily. 30 tablet 1   polyethylene glycol (MIRALAX  / GLYCOLAX ) 17 g packet Take 17 g by mouth daily. 14 each 0   thiamine  (VITAMIN B-1) 100 MG tablet Take 1 tablet (100 mg total) by mouth daily. 30 tablet 1   Pertinent GI related medications were reviewed with the patient  Inpatient Medications  Current  Facility-Administered Medications:    acetaminophen  (TYLENOL ) tablet 650 mg, 650 mg, Oral, Q6H PRN, 650 mg at 12/11/23 0257 **OR** acetaminophen  (TYLENOL ) suppository 650 mg, 650 mg, Rectal, Q6H PRN, Mansy, Jan A, MD   amLODipine  (NORVASC ) tablet 5 mg, 5 mg, Oral, Daily, Mansy, Jan A, MD   cefTRIAXone (ROCEPHIN) 1 g in sodium chloride  0.9 % 100 mL IVPB, 1 g, Intravenous, Q24H, Mansy, Jan A, MD   ferrous sulfate  tablet 325 mg, 325 mg, Oral, BID WC, Mansy, Jan A, MD   folic acid  (FOLVITE ) tablet 1 mg, 1 mg, Oral, Daily, Mansy, Jan A, MD   lisinopril  (ZESTRIL ) tablet 20 mg, 20 mg, Oral, Daily, Mansy, Jan A, MD   LORazepam  (ATIVAN ) injection 0-4 mg, 0-4 mg, Intravenous, Q6H, Sung, Jade J, MD   magnesium  hydroxide (MILK OF MAGNESIA) suspension 30 mL, 30 mL, Oral, Daily PRN, Mansy, Jan A, MD   montelukast  (SINGULAIR ) tablet 10 mg, 10 mg, Oral, QHS, Mansy, Jan A, MD   octreotide (SANDOSTATIN) 2 mcg/mL load via infusion 50 mcg, 50 mcg, Intravenous, Once **AND**  octreotide (SANDOSTATIN) 500 mcg in sodium chloride  0.9 % 250 mL (2 mcg/mL) infusion, 50 mcg/hr, Intravenous, Continuous, Mansy, Jan A, MD   ondansetron  (ZOFRAN ) tablet 4 mg, 4 mg, Oral, Q6H PRN **OR** ondansetron  (ZOFRAN ) injection 4 mg, 4 mg, Intravenous, Q6H PRN, Mansy, Jan A, MD   pantoprazole  (PROTONIX ) injection 40 mg, 40 mg, Intravenous, Q12H, Mansy, Jan A, MD   potassium chloride  (KLOR-CON ) packet 40 mEq, 40 mEq, Oral, Once, Mansy, Jan A, MD   thiamine  (VITAMIN B1) tablet 100 mg, 100 mg, Oral, Daily, Mansy, Jan A, MD   traZODone (DESYREL) tablet 25 mg, 25 mg, Oral, QHS PRN, Mansy, Jan A, MD  cefTRIAXone (ROCEPHIN)  IV     octreotide (SANDOSTATIN) 500 mcg in sodium chloride  0.9 % 250 mL (2 mcg/mL) infusion      acetaminophen  **OR** acetaminophen , magnesium  hydroxide, ondansetron  **OR** ondansetron  (ZOFRAN ) IV, traZODone   Objective   Vitals:   12/11/23 0109 12/11/23 0201 12/11/23 0347 12/11/23 0645  BP: (!) 132/57 (!) 142/79 117/62 (!)  117/56  Pulse: (!) 113 (!) 115 (!) 112 (!) 109  Resp: 20 20 18 18   Temp: 98.3 F (36.8 C) 98.4 F (36.9 C) 99.3 F (37.4 C) 98.6 F (37 C)  TempSrc:   Oral Oral  SpO2: 100% 100% 97% 98%  Weight:  105.9 kg    Height:  6\' 3"  (1.905 m)       Physical Exam Vitals and nursing note reviewed.  Constitutional:      General: He is not in acute distress.    Appearance: He is ill-appearing. He is not toxic-appearing or diaphoretic.  HENT:     Head: Normocephalic and atraumatic.     Nose: Nose normal.     Mouth/Throat:     Mouth: Mucous membranes are moist.     Pharynx: Oropharynx is clear.  Eyes:     General: No scleral icterus.    Extraocular Movements: Extraocular movements intact.  Cardiovascular:     Rate and Rhythm: Regular rhythm. Tachycardia present.  Pulmonary:     Effort: Pulmonary effort is normal. No respiratory distress.     Breath sounds: Normal breath sounds. No wheezing, rhonchi or rales.  Abdominal:     General: Bowel sounds are normal. There is distension.     Palpations: Abdomen is soft.     Tenderness: There is no abdominal tenderness. There is no guarding or rebound.  Genitourinary:    Comments: Swelling of the penis and scrotum; patient demonstrated swelling Musculoskeletal:     Cervical back: Neck supple.     Right lower leg: Edema present.     Left lower leg: Edema present.     Comments: + Anasarca  Skin:    General: Skin is warm and dry.     Coloration: Skin is not jaundiced or pale.  Neurological:     Mental Status: He is alert.     Comments: + asterixis Answers questions appropriately. Question of confabulation      Laboratory Data Recent Labs  Lab 12/10/23 2213 12/11/23 0103  WBC 3.6* 3.9*  HGB 4.4* 4.2*  HCT 16.6* 16.1*  PLT 132* 125*   Recent Labs  Lab 12/10/23 2213 12/10/23 2311 12/11/23 0103  NA 129*  --  129*  K 3.5  --  3.2*  CL 99  --  100  CO2 21*  --  26  BUN 10  --  9  CALCIUM 7.4*  --  7.3*  PROT  --  7.4  --  BILITOT  --  3.0*  --   ALKPHOS  --  78  --   ALT  --  15  --   AST  --  31  --   GLUCOSE 106*  --  102*   Recent Labs  Lab 12/10/23 2311  INR 1.8*    Recent Labs    12/10/23 2311  LIPASE 63*        Imaging Studies: DG Chest Portable 1 View Result Date: 12/10/2023 CLINICAL DATA:  Chest pain EXAM: PORTABLE CHEST 1 VIEW COMPARISON:  None Available. FINDINGS: Mild right basilar atelectasis. Lungs are otherwise clear. No pneumothorax or pleural effusion. Cardiac size within normal limits. Pulmonary vascularity is normal. Osseous structures are age-appropriate. No acute bone abnormality. IMPRESSION: No active disease. Electronically Signed   By: Worthy Heads M.D.   On: 12/10/2023 22:20    Assessment:   # Severe symptomatic Anemia- suspect acute on chronic - pt vital signs are stable and he is conversant despite hgb of 4.2; suspect this is chronic. - reports one episode of melena last month- no further hematochezia or melena - hgb was 8 in May 2024 - continued etoh abuse and poor nutrition likely contributors - stool brown on EM provider exam -  h/o iron deficiency - c/o sob; no chest pain  # Cirrhosis 2/2 etoh; - hcv ab positive last year. Will collect rna - no history of any decompensating events at this time  # Etoh dependence - potentially some confabulation- downplays etoh intake. Variable reports in notes   # Anasarca - pt with distended abdomen, BLE edema, and swelling in the penis and scrotum  # multiple electrolyte derangements  - hypo na, hypo k  # portal htn - coagulopathy - thrombocytopenia - hyper bili - hypo albuminemia  # HTN # COPD  Plan:   Esophagogastroduodenoscopy planned for tomorrow pending patient stability and endoscopy suite availability Hgb will need to be above 7 for anesthesia/sedation. Recommend range from 7-9 Potassium will need to remain above 3 for sedation Further recommendations pending egd  NPO at midnight Clear liquids  now Labs in am- cmp, cbc, inr Protonix  40 mg iv q12 h Hold dvt ppx Octreotide bolus and gtt for 72 hours total Ceftriaxone 1 g iv q24 for 7 days total for sbp ppx Monitor H&H.  Transfusion and resuscitation as per primary team Avoid frequent lab draws to prevent lab induced anemia Supportive care and antiemetics as per primary team Maintain two sites IV access Avoid nsaids Monitor for GIB.  Stool occult blood test has no role in evaluation of anemia and is a test for colon cancer screening and is inappropriate to be used for evaluation of anemia/gastrointestinal bleeding, as a negative or positive test would not change evaluation.  Monitor for sx of etoh withdrawal (alcohol present on admission); ciwa protocol  Unable to urinate with genital swelling- recommend catheter placement  Hepatitis C antibody positive in the past, check RNA  Will need eventual diuresis; will order post egd as to not further deteriorate electrolytes prior to sedation/endoscopy Electrolyte management as per primary team and pharmacy  Esophagogastroduodenoscopy with possible biopsy, control of bleeding, polypectomy, and interventions as necessary has been discussed with the patient/patient representative. Informed consent was obtained from the patient/patient representative after explaining the indication, nature, and risks of the procedure including but not limited to death, bleeding, perforation, missed neoplasm/lesions, cardiorespiratory compromise, and reaction to medications. Opportunity for questions was given and appropriate answers were provided. Patient/patient representative has  verbalized understanding is amenable to undergoing the procedure.  Management of other medical comorbidities as per primary team  I personally performed the service.  Thank you for allowing us  to participate in this patient's care. Please don't hesitate to call if any questions or concerns arise.   Joel Buckle,  DO [facility]  Portions of the record may have been created with voice recognition software. Occasional wrong-word or 'sound-a-like' substitutions may have occurred due to the inherent limitations of voice recognition software.  Read the chart carefully and recognize, using context, where substitutions may have occurred.

## 2023-12-11 NOTE — Plan of Care (Signed)
  Problem: Education: Goal: Knowledge of General Education information will improve Description: Including pain rating scale, medication(s)/side effects and non-pharmacologic comfort measures Outcome: Progressing   Problem: Clinical Measurements: Goal: Ability to maintain clinical measurements within normal limits will improve Outcome: Progressing Goal: Will remain free from infection Outcome: Progressing Goal: Diagnostic test results will improve Outcome: Progressing Goal: Respiratory complications will improve Outcome: Progressing Goal: Cardiovascular complication will be avoided Outcome: Progressing   Problem: Activity: Goal: Risk for activity intolerance will decrease Outcome: Progressing   Problem: Nutrition: Goal: Adequate nutrition will be maintained Outcome: Progressing   Problem: Coping: Goal: Level of anxiety will decrease Outcome: Progressing   Problem: Elimination: Goal: Will not experience complications related to urinary retention Outcome: Progressing   Problem: Pain Managment: Goal: General experience of comfort will improve and/or be controlled Outcome: Progressing   Problem: Safety: Goal: Ability to remain free from injury will improve Outcome: Progressing   Problem: Skin Integrity: Goal: Risk for impaired skin integrity will decrease Outcome: Progressing

## 2023-12-11 NOTE — Consult Note (Signed)
 Inpatient Consultation   Patient ID: Joel Harrell is a 68 y.o. male.  Requesting Provider: Amalia Jung, MD  Date of Admission: 12/10/2023  Date of Consult: 12/11/23   Reason for Consultation: Anemia, alcoholic cirrhosis  Patient's Chief Complaint:   Chief Complaint  Patient presents with   Leg Swelling    68 year old male with alcoholic cirrhosis, hepatitis C antibody positive unknown status, chronic anemia, alcohol dependence who presents to the hospital with worsening shortness of breath fatigue weakness and found to have worsening anemia.  GI is consulted for anemia and concern for GI bleeding.  Patient is a limited historian.  There is a question of confabulation.  He other downplays his alcohol intake or is unaware of this.  He does report he drinks 1 beer or 1 mini bottle per day, but previous notes report for more than this.  He has noted increasing swelling in his genitals, legs and abdomen. Increased sob including orthopnea. Hemoglobin on presentation was 4.4.  Last year it was 8.  He has only received 1 unit from the emergency department and is scheduled to see tomorrow on the floor.  ED providers exam demonstrated brown stool with trace guaiac positive.  Patient reported one-time dark stool a month ago.  No hematochezia hematemesis or coffee-ground emesis.  He reports a long as he has gone without an alcoholic beverages 1 week.  He denies any previous withdrawal symptoms such as seizures or hallucinations.  Denies NSAIDs, Anti-plt agents, and anticoagulants Denies family history of gastrointestinal disease and malignancy Previous Endoscopies: None    68 year old male with alcoholic cirrhosis, hepatitis C antibody positive unknown status, chronic anemia, alcohol dependence who presents to the hospital with worsening shortness of breath fatigue weakness and found to have worsening anemia.  GI is consulted for anemia and concern for GI bleeding.  Patient is a limited  historian.  There is a question of confabulation.  He other downplays his alcohol intake or is unaware of this.  He does report he drinks 1 beer or 1 mini bottle per day, but previous notes report for more than this.  He has noted increasing swelling in his genitals, legs and abdomen. Increased sob including orthopnea. Hemoglobin on presentation was 4.4.  Last year it was 8.  He has only received 1 unit from the emergency department and is scheduled to see tomorrow on the floor.  ED providers exam demonstrated brown stool with trace guaiac positive.  Patient reported one-time dark stool a month ago.  No hematochezia hematemesis or coffee-ground emesis.  He reports a long as he has gone without an alcoholic beverages 1 week.  He denies any previous withdrawal symptoms such as seizures or hallucinations.  Denies NSAIDs, Anti-plt agents, and anticoagulants Denies family history of gastrointestinal disease and malignancy Previous Endoscopies: None  Past Medical History:  Diagnosis Date   Asthma    CHF (congestive heart failure) (HCC)    Hypertension     History reviewed. No pertinent surgical history.  No Known Allergies  Family History  Problem Relation Age of Onset   Heart attack Father    Cancer Mother     Social History   Tobacco Use   Smoking status: Never   Smokeless tobacco: Never  Vaping Use   Vaping status: Never Used  Substance Use Topics   Alcohol use: Yes    Alcohol/week: 10.0 standard drinks of alcohol    Types: 10 Cans of beer per week   Drug use: No  Pertinent GI related history and allergies were reviewed with the patient  Review of Systems  Constitutional:  Positive for activity change, appetite change and fatigue. Negative for chills, diaphoresis, fever and unexpected weight change.  HENT:  Negative for trouble swallowing and voice change.   Respiratory:  Positive for shortness of breath. Negative for wheezing.   Cardiovascular:  Positive for leg  swelling. Negative for chest pain and palpitations.  Gastrointestinal:  Positive for abdominal distention. Negative for abdominal pain, anal bleeding, blood in stool, constipation, diarrhea, nausea and vomiting.  Genitourinary:  Positive for penile swelling.  Skin:  Negative for color change and pallor.  Neurological:  Positive for weakness. Negative for dizziness and syncope.  All other systems reviewed and are negative.    Medications Home Medications No current facility-administered medications on file prior to encounter.   Current Outpatient Medications on File Prior to Encounter  Medication Sig Dispense Refill   aspirin  81 MG EC tablet Take 81 mg by mouth daily.       folic acid  (FOLVITE ) 1 MG tablet Take 1 tablet (1 mg total) by mouth daily. 30 tablet 1   lisinopril  (ZESTRIL ) 20 MG tablet Take 1 tablet (20 mg total) by mouth daily. 90 tablet 0   albuterol  (VENTOLIN  HFA) 108 (90 Base) MCG/ACT inhaler Inhale 2 puffs into the lungs every 6 (six) hours as needed for wheezing or shortness of breath. (Patient not taking: Reported on 11/22/2022) 6.7 g 2   amLODipine  (NORVASC ) 5 MG tablet Take 1 tablet (5 mg total) by mouth daily. 90 tablet 1   budesonide -formoterol  (SYMBICORT ) 160-4.5 MCG/ACT inhaler Inhale 2 puffs into the lungs 2 (two) times daily. (Patient not taking: Reported on 11/22/2022) 1 Inhaler 1   ferrous sulfate  325 (65 FE) MG EC tablet Take 1 tablet (325 mg total) by mouth 2 (two) times daily. 60 tablet 3   montelukast  (SINGULAIR ) 10 MG tablet Take 1 tablet (10 mg total) by mouth at bedtime. 30 tablet 2   pantoprazole  (PROTONIX ) 40 MG tablet Take 1 tablet (40 mg total) by mouth daily. 30 tablet 1   polyethylene glycol (MIRALAX  / GLYCOLAX ) 17 g packet Take 17 g by mouth daily. 14 each 0   thiamine  (VITAMIN B-1) 100 MG tablet Take 1 tablet (100 mg total) by mouth daily. 30 tablet 1   Pertinent GI related medications were reviewed with the patient  Inpatient Medications  Current  Facility-Administered Medications:    acetaminophen  (TYLENOL ) tablet 650 mg, 650 mg, Oral, Q6H PRN, 650 mg at 12/11/23 0257 **OR** acetaminophen  (TYLENOL ) suppository 650 mg, 650 mg, Rectal, Q6H PRN, Mansy, Jan A, MD   amLODipine  (NORVASC ) tablet 5 mg, 5 mg, Oral, Daily, Mansy, Jan A, MD   cefTRIAXone (ROCEPHIN) 1 g in sodium chloride  0.9 % 100 mL IVPB, 1 g, Intravenous, Q24H, Mansy, Jan A, MD   ferrous sulfate  tablet 325 mg, 325 mg, Oral, BID WC, Mansy, Jan A, MD   folic acid  (FOLVITE ) tablet 1 mg, 1 mg, Oral, Daily, Mansy, Jan A, MD   lisinopril  (ZESTRIL ) tablet 20 mg, 20 mg, Oral, Daily, Mansy, Jan A, MD   LORazepam  (ATIVAN ) injection 0-4 mg, 0-4 mg, Intravenous, Q6H, Sung, Jade J, MD   magnesium  hydroxide (MILK OF MAGNESIA) suspension 30 mL, 30 mL, Oral, Daily PRN, Mansy, Jan A, MD   montelukast  (SINGULAIR ) tablet 10 mg, 10 mg, Oral, QHS, Mansy, Jan A, MD   octreotide (SANDOSTATIN) 2 mcg/mL load via infusion 50 mcg, 50 mcg, Intravenous, Once **AND**  octreotide (SANDOSTATIN) 500 mcg in sodium chloride  0.9 % 250 mL (2 mcg/mL) infusion, 50 mcg/hr, Intravenous, Continuous, Mansy, Jan A, MD   ondansetron  (ZOFRAN ) tablet 4 mg, 4 mg, Oral, Q6H PRN **OR** ondansetron  (ZOFRAN ) injection 4 mg, 4 mg, Intravenous, Q6H PRN, Mansy, Jan A, MD   pantoprazole  (PROTONIX ) injection 40 mg, 40 mg, Intravenous, Q12H, Mansy, Jan A, MD   potassium chloride  (KLOR-CON ) packet 40 mEq, 40 mEq, Oral, Once, Mansy, Jan A, MD   thiamine  (VITAMIN B1) tablet 100 mg, 100 mg, Oral, Daily, Mansy, Jan A, MD   traZODone (DESYREL) tablet 25 mg, 25 mg, Oral, QHS PRN, Mansy, Jan A, MD  cefTRIAXone (ROCEPHIN)  IV     octreotide (SANDOSTATIN) 500 mcg in sodium chloride  0.9 % 250 mL (2 mcg/mL) infusion      acetaminophen  **OR** acetaminophen , magnesium  hydroxide, ondansetron  **OR** ondansetron  (ZOFRAN ) IV, traZODone   Objective   Vitals:   12/11/23 0109 12/11/23 0201 12/11/23 0347 12/11/23 0645  BP: (!) 132/57 (!) 142/79 117/62 (!)  117/56  Pulse: (!) 113 (!) 115 (!) 112 (!) 109  Resp: 20 20 18 18   Temp: 98.3 F (36.8 C) 98.4 F (36.9 C) 99.3 F (37.4 C) 98.6 F (37 C)  TempSrc:   Oral Oral  SpO2: 100% 100% 97% 98%  Weight:  105.9 kg    Height:  6\' 3"  (1.905 m)       Physical Exam Vitals and nursing note reviewed.  Constitutional:      General: He is not in acute distress.    Appearance: He is ill-appearing. He is not toxic-appearing or diaphoretic.  HENT:     Head: Normocephalic and atraumatic.     Nose: Nose normal.     Mouth/Throat:     Mouth: Mucous membranes are moist.     Pharynx: Oropharynx is clear.  Eyes:     General: No scleral icterus.    Extraocular Movements: Extraocular movements intact.  Cardiovascular:     Rate and Rhythm: Regular rhythm. Tachycardia present.  Pulmonary:     Effort: Pulmonary effort is normal. No respiratory distress.     Breath sounds: Normal breath sounds. No wheezing, rhonchi or rales.  Abdominal:     General: Bowel sounds are normal. There is distension.     Palpations: Abdomen is soft.     Tenderness: There is no abdominal tenderness. There is no guarding or rebound.  Genitourinary:    Comments: Swelling of the penis and scrotum; patient demonstrated swelling Musculoskeletal:     Cervical back: Neck supple.     Right lower leg: Edema present.     Left lower leg: Edema present.     Comments: + Anasarca  Skin:    General: Skin is warm and dry.     Coloration: Skin is not jaundiced or pale.  Neurological:     Mental Status: He is alert.     Comments: + asterixis Answers questions appropriately. Question of confabulation      Laboratory Data Recent Labs  Lab 12/10/23 2213 12/11/23 0103  WBC 3.6* 3.9*  HGB 4.4* 4.2*  HCT 16.6* 16.1*  PLT 132* 125*   Recent Labs  Lab 12/10/23 2213 12/10/23 2311 12/11/23 0103  NA 129*  --  129*  K 3.5  --  3.2*  CL 99  --  100  CO2 21*  --  26  BUN 10  --  9  CALCIUM 7.4*  --  7.3*  PROT  --  7.4  --  BILITOT  --  3.0*  --   ALKPHOS  --  78  --   ALT  --  15  --   AST  --  31  --   GLUCOSE 106*  --  102*   Recent Labs  Lab 12/10/23 2311  INR 1.8*    Recent Labs    12/10/23 2311  LIPASE 63*        Imaging Studies: DG Chest Portable 1 View Result Date: 12/10/2023 CLINICAL DATA:  Chest pain EXAM: PORTABLE CHEST 1 VIEW COMPARISON:  None Available. FINDINGS: Mild right basilar atelectasis. Lungs are otherwise clear. No pneumothorax or pleural effusion. Cardiac size within normal limits. Pulmonary vascularity is normal. Osseous structures are age-appropriate. No acute bone abnormality. IMPRESSION: No active disease. Electronically Signed   By: Worthy Heads M.D.   On: 12/10/2023 22:20    Assessment:   # Severe symptomatic Anemia- suspect acute on chronic - pt vital signs are stable and he is conversant despite hgb of 4.2; suspect this is chronic. - reports one episode of melena last month- no further hematochezia or melena - hgb was 8 in May 2024 - continued etoh abuse and poor nutrition likely contributors - stool brown on EM provider exam -  h/o iron deficiency - c/o sob; no chest pain  # Cirrhosis 2/2 etoh; - hcv ab positive last year. Will collect rna - no history of any decompensating events at this time  # Etoh dependence - potentially some confabulation- downplays etoh intake. Variable reports in notes   # Anasarca - pt with distended abdomen, BLE edema, and swelling in the penis and scrotum  # multiple electrolyte derangements  - hypo na, hypo k  # portal htn - coagulopathy - thrombocytopenia - hyper bili - hypo albuminemia  # HTN # COPD  Plan:   Esophagogastroduodenoscopy planned for tomorrow pending patient stability and endoscopy suite availability Hgb will need to be above 7 for anesthesia/sedation. Recommend range from 7-9 Potassium will need to remain above 3 for sedation Further recommendations pending egd  NPO at midnight Clear liquids  now Labs in am- cmp, cbc, inr Protonix  40 mg iv q12 h Hold dvt ppx Octreotide bolus and gtt for 72 hours total Ceftriaxone 1 g iv q24 for 7 days total for sbp ppx Monitor H&H.  Transfusion and resuscitation as per primary team Avoid frequent lab draws to prevent lab induced anemia Supportive care and antiemetics as per primary team Maintain two sites IV access Avoid nsaids Monitor for GIB.  Stool occult blood test has no role in evaluation of anemia and is a test for colon cancer screening and is inappropriate to be used for evaluation of anemia/gastrointestinal bleeding, as a negative or positive test would not change evaluation.  Monitor for sx of etoh withdrawal (alcohol present on admission); ciwa protocol  Unable to urinate with genital swelling- recommend catheter placement  Hepatitis C antibody positive in the past, check RNA  Will need eventual diuresis; will order post egd as to not further deteriorate electrolytes prior to sedation/endoscopy Electrolyte management as per primary team and pharmacy  Esophagogastroduodenoscopy with possible biopsy, control of bleeding, polypectomy, and interventions as necessary has been discussed with the patient/patient representative. Informed consent was obtained from the patient/patient representative after explaining the indication, nature, and risks of the procedure including but not limited to death, bleeding, perforation, missed neoplasm/lesions, cardiorespiratory compromise, and reaction to medications. Opportunity for questions was given and appropriate answers were provided. Patient/patient representative has  verbalized understanding is amenable to undergoing the procedure.  Management of other medical comorbidities as per primary team  I personally performed the service.  Thank you for allowing us  to participate in this patient's care. Please don't hesitate to call if any questions or concerns arise.   Quintin Buckle,  DO [facility]  Portions of the record may have been created with voice recognition software. Occasional wrong-word or 'sound-a-like' substitutions may have occurred due to the inherent limitations of voice recognition software.  Read the chart carefully and recognize, using context, where substitutions may have occurred.

## 2023-12-11 NOTE — Assessment & Plan Note (Signed)
-   Will continue his inhalers.

## 2023-12-11 NOTE — Progress Notes (Signed)
 Progress Note   Patient: Joel Harrell YQI:[id number] DOB: 1956-03-17 DOA: 12/10/2023     1 DOS: the patient was seen and examined on 12/11/2023   Brief hospital course: Joel Harrell is a 68 y.o. male with medical history significant for asthma, CHF, hypertension, alcoholic liver cirrhosis, and COPD, who presented to the emergency room with acute onset of bilateral lower extremity edema as well as abdominal distention with scrotal edema worsening over the last week. He admitted to drinking 12 pack beer per week although his family stated that he also drinks liquor.  Upon arriving to hospital, hemoglobin was 4.2.  Patient states that he had a black stool a month ago, since then, he has been having brown stools every day. Due to liver cirrhosis, patient was given Protonix , octreotide, and PRBC.  GI consult obtained.   Principal Problem:   GI bleeding Active Problems:   Alcoholic cirrhosis of liver with ascites (HCC)   Alcohol abuse   Chronic obstructive pulmonary disease (COPD) (HCC)   Essential hypertension   Assessment and Plan: * GI bleeding Pancytopenia secondary to liver cirrhosis. Acute blood loss anemia secondary to GI bleed. B12 deficient anemia. Based on history, patient had a black stool about a month ago.  Since then, patient has been having brown stools.  Patient also has progressively worsening shortness of breath indicating drop hemoglobin was gradual. Patient has received 2 units of PRBC, will continue monitor hemoglobin, again may need more transfusion, goal is hemoglobin 7.  Continue octreotide, Protonix  and Rocephin. Reviewed the lab, patient had a borderline B12 level last year, recheck levels again.  Started giving B12 injection.  Alcoholic cirrhosis of liver with ascites (HCC) Alcohol abuse. Patient has anasarca with severe leg edema.  Will start diuretics when condition is more stable.  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 Hypokalemia. Hypomagnesemia. Patient is on clear liquid for concern of GI bleed.  Start fluid restriction.  Magnesium  was repleted, given potassium today.  Urinary retention secondary to benign prostate hypertrophy and scrotal edema. Severe scrotal edema secondary to liver cirrhosis. Patient could not urinate, RN failed to place Foley catheter.  Urology consult obtained.  Essential hypertension - Will continue antihypertensive therapy.  Chronic obstructive pulmonary disease (COPD) (HCC) Will continue his inhalers.       Subjective:  Patient could not urinate, denies any shortness of breath.  Physical Exam: Vitals:   12/11/23 0347 12/11/23 0645 12/11/23 0712 12/11/23 1001  BP: 117/62 (!) 117/56 (!) 124/59 (!) 113/56  Pulse: (!) 112 (!) 109 (!) 110 (!) 106  Resp: 18 18 18 19   Temp: 99.3 F (37.4 C) 98.6 F (37 C) 98.6 F (37 C) 98.5 F (36.9 C)  TempSrc: Oral Oral  Oral  SpO2: 97% 98%  96%  Weight:      Height:       General exam: Appears calm and comfortable  Respiratory system: Clear to auscultation. Respiratory effort normal. Cardiovascular system: S1 & S2 heard, RRR. No JVD, murmurs, rubs, gallops or clicks.  Gastrointestinal system: Abdomen is distended, soft and nontender. No organomegaly or masses felt. Normal bowel sounds heard. Central nervous system: Alert and oriented. No focal neurological deficits. Extremities: 4+ leg edema. Skin: No rashes, lesions or ulcers Psychiatry: Judgement and insight appear normal. Mood & affect appropriate.    Data Reviewed:  Reviewed chest x-ray results, lab results.  Prior lab results.  Family Communication: None  Disposition: Status is: Inpatient Remains inpatient appropriate because: Severity of disease, IV  treatment.     Time spent: 55 minutes  Author: Donaciano Frizzle, MD 12/11/2023 10:45 AM  For on call review [email or web address].

## 2023-12-11 NOTE — H&P (Addendum)
 [place]   PATIENT NAME: Joel Harrell    MR#:  [id number]  DATE OF BIRTH:  [birth year]  DATE OF ADMISSION:  12/10/2023  PRIMARY CARE PHYSICIAN: Patient, No Pcp Per   Patient is coming from: Home  REQUESTING/REFERRING PHYSICIAN: Meredith Stalls, MD  CHIEF COMPLAINT:   Chief Complaint  Patient presents with   Leg Swelling    HISTORY OF PRESENT ILLNESS:  Joel Harrell is a 69 y.o. male with medical history significant for asthma, CHF, hypertension, alcoholic liver cirrhosis, and COPD, who presented to the emergency room with acute onset of bilateral lower extremity edema as well as abdominal distention with scrotal edema worsening over the last week.  Urinalysis hypertensive medications for 4 days.  He admitted to drinking 12 pack beer per week although his family stated that he also drinks liquor.  He has been having orthopnea.  He denies any cough or wheezing or hemoptysis.  No melena or bright red bleeding per rectum.  He admits to chills without measured fever.  He has been having fatigue and tiredness.  No paresthesias or focal muscle weakness.  No bleeding diathesis.  He had positive stool Hemoccult though in the ER.  ED Course: When he came to the ER, BP was 145/92 with heart rate 112, respiratory rate of 23 that showed 97.8.  Labs revealed hyponatremia 129 and calcium 7.4.  His lipase was 63 and albumin 2.3 with total bili of 3.  BNP was 161.2 and high-sensitivity troponin I was 405.  CBC showed hemoglobin of 4.4 hematocrit 16.6 with platelets 132 and WBCs of 3.6.  INR is 1.8 and PTT 21.5.  Blood group was AB+ with negative antibody screen.  Alcohol level 79. EKG as reviewed by me : EKG showed sinus tachycardia with a rate of 109 with low voltage QRS. Imaging: Portable chest x-ray showed no acute cardiopulmonary disease.  The patient was given DuoNeb, 2 g IV magnesium  sulfate, 40 mg of IV Protonix  and was placed on CIWA protocol with IV Ativan .  He will be admitted to a progressive  unit bed for further evaluation and management PAST MEDICAL HISTORY:   Past Medical History:  Diagnosis Date   Asthma    CHF (congestive heart failure) (HCC)    Hypertension   Alcoholic liver cirrhosis and COPD  PAST SURGICAL HISTORY:  History reviewed. No pertinent surgical history.  SOCIAL HISTORY:   Social History   Tobacco Use   Smoking status: Never   Smokeless tobacco: Never  Substance Use Topics   Alcohol use: Yes    Alcohol/week: 10.0 standard drinks of alcohol    Types: 10 Cans of beer per week    FAMILY HISTORY:   Family History  Problem Relation Age of Onset   Heart attack Father    Cancer Mother     DRUG ALLERGIES:  No Known Allergies  REVIEW OF SYSTEMS:   ROS As per history of present illness. All pertinent systems were reviewed above. Constitutional, HEENT, cardiovascular, respiratory, GI, GU, musculoskeletal, neuro, psychiatric, endocrine, integumentary and hematologic systems were reviewed and are otherwise negative/unremarkable except for positive findings mentioned above in the HPI.   MEDICATIONS AT HOME:   Prior to Admission medications   Medication Sig Start Date End Date Taking? Authorizing Provider  aspirin  81 MG EC tablet Take 81 mg by mouth daily.     Yes [provider]  folic acid  (FOLVITE ) 1 MG tablet Take 1 tablet (1 mg total) by mouth daily.  11/24/22  Yes Magdalene School, MD  lisinopril  (ZESTRIL ) 20 MG tablet Take 1 tablet (20 mg total) by mouth daily. 11/23/22 12/11/23 Yes Magdalene School, MD  albuterol  (VENTOLIN  HFA) 108 (90 Base) MCG/ACT inhaler Inhale 2 puffs into the lungs every 6 (six) hours as needed for wheezing or shortness of breath. Patient not taking: Reported on 11/22/2022 12/07/19   Lynnda Sas, MD  amLODipine  (NORVASC ) 5 MG tablet Take 1 tablet (5 mg total) by mouth daily. 11/23/22 05/22/23  Magdalene School, MD  budesonide -formoterol  (SYMBICORT ) 160-4.5 MCG/ACT inhaler Inhale 2 puffs into the lungs 2 (two) times  daily. Patient not taking: Reported on 11/22/2022 12/05/18   Bryson Carbine, MD  ferrous sulfate  325 (65 FE) MG EC tablet Take 1 tablet (325 mg total) by mouth 2 (two) times daily. 11/23/22 11/23/23  Magdalene School, MD  montelukast  (SINGULAIR ) 10 MG tablet Take 1 tablet (10 mg total) by mouth at bedtime. 12/28/15 12/27/16  Arlan Labella, MD  pantoprazole  (PROTONIX ) 40 MG tablet Take 1 tablet (40 mg total) by mouth daily. 11/23/22 11/23/23  Magdalene School, MD  polyethylene glycol (MIRALAX  / GLYCOLAX ) 17 g packet Take 17 g by mouth daily. 11/24/22   Magdalene School, MD  thiamine  (VITAMIN B-1) 100 MG tablet Take 1 tablet (100 mg total) by mouth daily. 11/24/22   Magdalene School, MD      VITAL SIGNS:  Blood pressure 117/62, pulse (!) 112, temperature 99.3 F (37.4 C), temperature source Oral, resp. rate 18, height 6\' 3"  (1.905 m), weight 105.9 kg, SpO2 97%.  PHYSICAL EXAMINATION:  Physical Exam  GENERAL:  68 y.o.-year-old male patient lying in the bed with no acute distress.  EYES: Pupils equal, round, reactive to light and accommodation. No scleral icterus. Extraocular muscles intact.  HEENT: Head atraumatic, normocephalic. Oropharynx and nasopharynx clear.  NECK:  Supple, no jugular venous distention. No thyroid enlargement, no tenderness.  LUNGS: Normal breath sounds bilaterally, no wheezing, rales,rhonchi or crepitation. No use of accessory muscles of respiration.  CARDIOVASCULAR: Regular rate and rhythm, S1, S2 normal. No murmurs, rubs, or gallops.  ABDOMEN: Soft, distended, nontender. Bowel sounds present. No organomegaly or mass.  EXTREMITIES: 1-2+ bilateral lower extremity pitting edema, with no cyanosis, or clubbing.  NEUROLOGIC: Cranial nerves II through XII are intact. Muscle strength 5/5 in all extremities. Sensation intact. Gait not checked.  PSYCHIATRIC: The patient is alert and oriented x 3.  Normal affect and good eye contact. SKIN: No obvious rash, lesion, or ulcer.   LABORATORY  PANEL:   CBC Recent Labs  Lab 12/11/23 0103  WBC 3.9*  HGB 4.2*  HCT 16.1*  PLT 125*   ------------------------------------------------------------------------------------------------------------------  Chemistries  Recent Labs  Lab 12/10/23 2213 12/10/23 2311 12/11/23 0103  NA 129*  --  129*  K 3.5  --  3.2*  CL 99  --  100  CO2 21*  --  26  GLUCOSE 106*  --  102*  BUN 10  --  9  CREATININE 0.86  --  0.77  CALCIUM 7.4*  --  7.3*  MG 1.5*  --   --   AST  --  31  --   ALT  --  15  --   ALKPHOS  --  78  --   BILITOT  --  3.0*  --    ------------------------------------------------------------------------------------------------------------------  Cardiac Enzymes No results for input(s): "TROPONINI" in the last 168 hours. ------------------------------------------------------------------------------------------------------------------  RADIOLOGY:  DG Chest Portable 1 View Result Date: 12/10/2023 CLINICAL DATA:  Chest  pain EXAM: PORTABLE CHEST 1 VIEW COMPARISON:  None Available. FINDINGS: Mild right basilar atelectasis. Lungs are otherwise clear. No pneumothorax or pleural effusion. Cardiac size within normal limits. Pulmonary vascularity is normal. Osseous structures are age-appropriate. No acute bone abnormality. IMPRESSION: No active disease. Electronically Signed   By: Worthy Heads M.D.   On: 12/10/2023 22:20      IMPRESSION AND PLAN:  Assessment and Plan: * GI bleeding - This is likely of upper GI etiology possibly secondary to alcohol gastritis with subsequent acute blood loss anemia. - He was typed and crossmatched transfused unit of packed red blood cells.  Will follow posttransfusion H&H. - Will place him on IV Protonix . - Will also place him on IV octreotide with bolus and infusion and IV Rocephin in the setting of alcoholic liver cirrhosis. - Will continue with iron sulfate and folate. - GI consult will be obtained. - I notified Dr. Mamie Searles about the  patient.  Alcoholic cirrhosis of liver with ascites (HCC) - This is associated with liver cell failure and likely ascites with anasarca likely secondary to hypoalbuminemia.. - GI consult to be obtained as mentioned above. - He can be gently diuresed after PRBCs transfusion.  Alcohol abuse - She was counseled for alcohol cessation. - Will place him on CIWA protocol with IV Ativan  for alcohol withdrawal. - Will continue his thiamine , folate and multivitamins.  Essential hypertension - Will continue antihypertensive therapy.  Chronic obstructive pulmonary disease (COPD) (HCC) Will continue his inhalers.    DVT prophylaxis: SCDs Advanced Care Planning:  Code Status: full code. Family Communication:  The plan of care was discussed in details with the patient (and family). I answered all questions. The patient agreed to proceed with the above mentioned plan. Further management will depend upon hospital course. Disposition Plan: Back to previous home environment Consults called: GI All the records are reviewed and case discussed with ED provider.  Status is: Inpatient   At the time of the admission, it appears that the appropriate admission status for this patient is inpatient.  This is judged to be reasonable and necessary in order to provide the required intensity of service to ensure the patient's safety given the presenting symptoms, physical exam findings and initial radiographic and laboratory data in the context of comorbid conditions.  The patient requires inpatient status due to high intensity of service, high risk of further deterioration and high frequency of surveillance required.  I certify that at the time of admission, it is my clinical judgment that the patient will require inpatient hospital care extending more than 2 midnights.                            Dispo: The patient is from: Home              Anticipated d/c is to: Home              Patient currently is not  medically stable to d/c.              Difficult to place patient: No  Virgene Griffin M.D on 12/11/2023 at 6:10 AM  [facility]   From 7 PM-7 AM, contact night-coverage [email or web address]  CC: Primary care physician; Patient, No Pcp Per

## 2023-12-11 NOTE — Assessment & Plan Note (Addendum)
-   This is likely of upper GI etiology possibly secondary to alcohol gastritis with subsequent acute blood loss anemia. - He was typed and crossmatched transfused unit of packed red blood cells.  Will follow posttransfusion H&H. - Will place him on IV Protonix . - Will also place him on IV octreotide with bolus and infusion and IV Rocephin in the setting of alcoholic liver cirrhosis. - Will continue with iron sulfate and folate. - GI consult will be obtained. - I notified Dr. Mamie Searles about the patient.

## 2023-12-11 NOTE — Consult Note (Signed)
 I have been asked to see the patient by Dr. Donaciano Frizzle for evaluation and management of foley placement for urinary retention.  History of present illness: 68 year old man with a history of alcoholic cirrhosis, COPD, CHF and hypertension presented to the ED with bilateral lower extremity edema and abdominal distention found to have a hemoglobin of 4.2 concerning for GI bleed.  Urology was consulted for Foley catheter placement.  Patient's foreskin and scrotum was noted to be edematous and Foley could not be placed by nursing.  Bladder scan showed greater than 350 cc.   Review of systems: A 12 point comprehensive review of systems was obtained and is negative unless otherwise stated in the history of present illness.  Patient Active Problem List   Diagnosis Date Noted   Alcoholic cirrhosis of liver with ascites (HCC) 12/11/2023   Alcohol abuse 12/11/2023   Chronic obstructive pulmonary disease (COPD) (HCC) 12/11/2023   Essential hypertension 12/11/2023   GI bleeding 12/10/2023   Acute hepatic encephalopathy (HCC) 11/22/2022   Iron deficiency anemia due to chronic blood loss 11/22/2022   Decompensated hepatic cirrhosis (HCC) 11/22/2022   Alcohol use disorder 11/22/2022   Hypertension 11/22/2022   COPD (chronic obstructive pulmonary disease) (HCC) 11/22/2022    No current facility-administered medications on file prior to encounter.   Current Outpatient Medications on File Prior to Encounter  Medication Sig Dispense Refill   aspirin  81 MG EC tablet Take 81 mg by mouth daily.       folic acid  (FOLVITE ) 1 MG tablet Take 1 tablet (1 mg total) by mouth daily. 30 tablet 1   lisinopril  (ZESTRIL ) 20 MG tablet Take 1 tablet (20 mg total) by mouth daily. 90 tablet 0   albuterol  (VENTOLIN  HFA) 108 (90 Base) MCG/ACT inhaler Inhale 2 puffs into the lungs every 6 (six) hours as needed for wheezing or shortness of breath. (Patient not taking: Reported on 11/22/2022) 6.7 g 2   amLODipine  (NORVASC )  5 MG tablet Take 1 tablet (5 mg total) by mouth daily. 90 tablet 1   budesonide -formoterol  (SYMBICORT ) 160-4.5 MCG/ACT inhaler Inhale 2 puffs into the lungs 2 (two) times daily. (Patient not taking: Reported on 11/22/2022) 1 Inhaler 1   ferrous sulfate  325 (65 FE) MG EC tablet Take 1 tablet (325 mg total) by mouth 2 (two) times daily. 60 tablet 3   montelukast  (SINGULAIR ) 10 MG tablet Take 1 tablet (10 mg total) by mouth at bedtime. 30 tablet 2   pantoprazole  (PROTONIX ) 40 MG tablet Take 1 tablet (40 mg total) by mouth daily. 30 tablet 1   polyethylene glycol (MIRALAX  / GLYCOLAX ) 17 g packet Take 17 g by mouth daily. 14 each 0   thiamine  (VITAMIN B-1) 100 MG tablet Take 1 tablet (100 mg total) by mouth daily. 30 tablet 1    Past Medical History:  Diagnosis Date   Asthma    CHF (congestive heart failure) (HCC)    Hypertension     History reviewed. No pertinent surgical history.  Social History   Tobacco Use   Smoking status: Never   Smokeless tobacco: Never  Vaping Use   Vaping status: Never Used  Substance Use Topics   Alcohol use: Yes    Alcohol/week: 10.0 standard drinks of alcohol    Types: 10 Cans of beer per week   Drug use: No    Family History  Problem Relation Age of Onset   Heart attack Father    Cancer Mother     PE:  Vitals:   12/11/23 0347 12/11/23 0645 12/11/23 0712 12/11/23 1001  BP: 117/62 (!) 117/56 (!) 124/59 (!) 113/56  Pulse: (!) 112 (!) 109 (!) 110 (!) 106  Resp: 18 18 18 19   Temp: 99.3 F (37.4 C) 98.6 F (37 C) 98.6 F (37 C) 98.5 F (36.9 C)  TempSrc: Oral Oral  Oral  SpO2: 97% 98%  96%  Weight:      Height:       Patient appears to be in no acute distress  patient is alert and oriented x3 Atraumatic normocephalic head No cervical or supraclavicular lymphadenopathy appreciated No increased work of breathing, no audible wheezes/rhonchi Regular sinus rhythm/rate GU: Uncircumcised phallus with foreskin in anatomic position, edematous and  cannot be retracted, scrotal edema Grossly neurologically intact No identifiable skin lesions  Recent Labs    12/10/23 2213 12/11/23 0103  WBC 3.6* 3.9*  HGB 4.4* 4.2*  HCT 16.6* 16.1*   Recent Labs    12/10/23 2213 12/11/23 0103  NA 129* 129*  K 3.5 3.2*  CL 99 100  CO2 21* 26  GLUCOSE 106* 102*  BUN 10 9  CREATININE 0.86 0.77  CALCIUM 7.4* 7.3*   Recent Labs    12/10/23 2311  INR 1.8*   No results for input(s): "LABURIN" in the last 72 hours. No results found for this or any previous visit.  Imaging: None  Procedure: Patient was prepped and draped in the usual sterile fashion A 16 Fr silicone foley was then placed and guided by field to the urethral meatus under sterile conditions and advanced to the hub and return of urine was seen.  The balloon was inflated with 10cc sterile water.   Assessment/Recommendations: Scrotal/penile edema Urinary retention  -patient with edematous foreskin and urinary retention - Foley catheter was placed without difficulty and greater than 400 cc of concentrated urine drained - Catheter was secured to patient's leg - Continue Foley while scrotal/penile edema is present.  Recommend doing void trial when ready in the morning during the weekday. -start tamuslosin 0.4mg  qhs  Please page with any further questions or concerns. Davian Wollenberg D Landynn Dupler

## 2023-12-12 ENCOUNTER — Inpatient Hospital Stay

## 2023-12-12 ENCOUNTER — Inpatient Hospital Stay: Payer: Self-pay | Admitting: Anesthesiology

## 2023-12-12 ENCOUNTER — Encounter
Admission: EM | Disposition: A | Discharge status: Home Health | Payer: Self-pay | Source: Home / Self Care | Attending: Hospitalist

## 2023-12-12 ENCOUNTER — Encounter: Payer: Self-pay | Admitting: Family Medicine

## 2023-12-12 DIAGNOSIS — K766 Portal hypertension: Secondary | ICD-10-CM | POA: Insufficient documentation

## 2023-12-12 DIAGNOSIS — D62 Acute posthemorrhagic anemia: Secondary | ICD-10-CM | POA: Diagnosis not present

## 2023-12-12 DIAGNOSIS — E876 Hypokalemia: Secondary | ICD-10-CM | POA: Insufficient documentation

## 2023-12-12 DIAGNOSIS — K7031 Alcoholic cirrhosis of liver with ascites: Secondary | ICD-10-CM | POA: Diagnosis not present

## 2023-12-12 DIAGNOSIS — E871 Hypo-osmolality and hyponatremia: Secondary | ICD-10-CM | POA: Insufficient documentation

## 2023-12-12 DIAGNOSIS — K921 Melena: Secondary | ICD-10-CM | POA: Diagnosis not present

## 2023-12-12 HISTORY — PX: ESOPHAGOGASTRODUODENOSCOPY: SHX5428

## 2023-12-12 LAB — HEPATIC FUNCTION PANEL
ALT: 14 U/L (ref 0–44)
AST: 24 U/L (ref 15–41)
Albumin: 2.1 g/dL — ABNORMAL LOW (ref 3.5–5.0)
Alkaline Phosphatase: 62 U/L (ref 38–126)
Bilirubin, Direct: 1.9 mg/dL — ABNORMAL HIGH (ref 0.0–0.2)
Indirect Bilirubin: 2.5 mg/dL — ABNORMAL HIGH (ref 0.3–0.9)
Total Bilirubin: 4.4 mg/dL — ABNORMAL HIGH (ref 0.0–1.2)
Total Protein: 6.6 g/dL (ref 6.5–8.1)

## 2023-12-12 LAB — TYPE AND SCREEN
ABO/RH(D): AB POS
Antibody Screen: NEGATIVE
Unit division: 0
Unit division: 0
Unit division: 0
Unit division: 0

## 2023-12-12 LAB — BPAM RBC
Blood Product Expiration Date: 202506282359
Blood Product Expiration Date: 202506282359
Blood Product Expiration Date: 202506282359
Blood Product Expiration Date: 202506282359
ISSUE DATE / TIME: 202505310036
ISSUE DATE / TIME: 202505310650
ISSUE DATE / TIME: 202505311423
ISSUE DATE / TIME: 202505312112
Unit Type and Rh: 6200
Unit Type and Rh: 6200
Unit Type and Rh: 6200
Unit Type and Rh: 6200

## 2023-12-12 LAB — CBC
HCT: 24.3 % — ABNORMAL LOW (ref 39.0–52.0)
Hemoglobin: 7.4 g/dL — ABNORMAL LOW (ref 13.0–17.0)
MCH: 21.6 pg — ABNORMAL LOW (ref 26.0–34.0)
MCHC: 30.5 g/dL (ref 30.0–36.0)
MCV: 70.8 fL — ABNORMAL LOW (ref 80.0–100.0)
Platelets: 118 10*3/uL — ABNORMAL LOW (ref 150–400)
RBC: 3.43 MIL/uL — ABNORMAL LOW (ref 4.22–5.81)
RDW: 25.5 % — ABNORMAL HIGH (ref 11.5–15.5)
WBC: 3.8 10*3/uL — ABNORMAL LOW (ref 4.0–10.5)
nRBC: 0.5 % — ABNORMAL HIGH (ref 0.0–0.2)

## 2023-12-12 LAB — COMPREHENSIVE METABOLIC PANEL WITH GFR
ALT: 14 U/L (ref 0–44)
AST: 24 U/L (ref 15–41)
Albumin: 2.1 g/dL — ABNORMAL LOW (ref 3.5–5.0)
Alkaline Phosphatase: 64 U/L (ref 38–126)
Anion gap: 6 (ref 5–15)
BUN: 10 mg/dL (ref 8–23)
CO2: 23 mmol/L (ref 22–32)
Calcium: 7.4 mg/dL — ABNORMAL LOW (ref 8.9–10.3)
Chloride: 103 mmol/L (ref 98–111)
Creatinine, Ser: 0.86 mg/dL (ref 0.61–1.24)
GFR, Estimated: >60 mL/min (ref >60)
Glucose, Bld: 112 mg/dL — ABNORMAL HIGH (ref 70–99)
Potassium: 3.2 mmol/L — ABNORMAL LOW (ref 3.5–5.1)
Sodium: 132 mmol/L — ABNORMAL LOW (ref 135–145)
Total Bilirubin: 4.6 mg/dL — ABNORMAL HIGH (ref 0.0–1.2)
Total Protein: 6.7 g/dL (ref 6.5–8.1)

## 2023-12-12 LAB — HEMOGLOBIN: Hemoglobin: 8.2 g/dL — ABNORMAL LOW (ref 13.0–17.0)

## 2023-12-12 LAB — PROTIME-INR
INR: 2 — ABNORMAL HIGH (ref 0.8–1.2)
Prothrombin Time: 22.6 s — ABNORMAL HIGH (ref 11.4–15.2)

## 2023-12-12 LAB — PHOSPHORUS: Phosphorus: 2.3 mg/dL — ABNORMAL LOW (ref 2.5–4.6)

## 2023-12-12 LAB — MAGNESIUM: Magnesium: 1.6 mg/dL — ABNORMAL LOW (ref 1.7–2.4)

## 2023-12-12 SURGERY — EGD (ESOPHAGOGASTRODUODENOSCOPY)
Anesthesia: General

## 2023-12-12 MED ORDER — POTASSIUM CHLORIDE 20 MEQ PO PACK
40.0000 meq | PACK | ORAL | Status: AC
  Administered 2023-12-12 (×2): 40 meq via ORAL
  Filled 2023-12-12: qty 2

## 2023-12-12 MED ORDER — PROPOFOL 10 MG/ML IV BOLUS
INTRAVENOUS | Status: DC | PRN
  Administered 2023-12-12: 20 mg via INTRAVENOUS
  Administered 2023-12-12: 50 mg via INTRAVENOUS

## 2023-12-12 MED ORDER — PROPOFOL 1000 MG/100ML IV EMUL
INTRAVENOUS | Status: AC
  Filled 2023-12-12: qty 100

## 2023-12-12 MED ORDER — VITAMIN K1 10 MG/ML IJ SOLN
5.0000 mg | Freq: Once | INTRAVENOUS | Status: AC
  Administered 2023-12-12: 5 mg via INTRAVENOUS
  Filled 2023-12-12: qty 0.5

## 2023-12-12 MED ORDER — POTASSIUM & SODIUM PHOSPHATES 280-160-250 MG PO PACK
1.0000 | PACK | Freq: Three times a day (TID) | ORAL | Status: DC
  Administered 2023-12-12 (×3): 1 via ORAL
  Filled 2023-12-12 (×5): qty 1

## 2023-12-12 MED ORDER — MIDAZOLAM HCL 2 MG/2ML IJ SOLN
INTRAMUSCULAR | Status: AC
  Filled 2023-12-12: qty 2

## 2023-12-12 MED ORDER — PEG 3350-KCL-NA BICARB-NACL 420 G PO SOLR
2000.0000 mL | Freq: Once | ORAL | Status: DC | PRN
  Filled 2023-12-12: qty 4000

## 2023-12-12 MED ORDER — MAGNESIUM SULFATE 2 GM/50ML IV SOLN
2.0000 g | Freq: Once | INTRAVENOUS | Status: AC
  Administered 2023-12-12: 2 g via INTRAVENOUS
  Filled 2023-12-12: qty 50

## 2023-12-12 MED ORDER — PEG 3350-KCL-NA BICARB-NACL 420 G PO SOLR
4000.0000 mL | Freq: Once | ORAL | Status: AC
  Administered 2023-12-12: 4000 mL via ORAL
  Filled 2023-12-12: qty 4000

## 2023-12-12 MED ORDER — POTASSIUM CHLORIDE 10 MEQ/100ML IV SOLN
10.0000 meq | INTRAVENOUS | Status: DC
  Filled 2023-12-12 (×2): qty 100

## 2023-12-12 MED ORDER — MIDAZOLAM HCL 5 MG/5ML IJ SOLN
INTRAMUSCULAR | Status: DC | PRN
  Administered 2023-12-12: 2 mg via INTRAVENOUS

## 2023-12-12 MED ORDER — SODIUM CHLORIDE 0.9 % IV SOLN
1.0000 g | INTRAVENOUS | Status: DC
  Administered 2023-12-12 – 2023-12-14 (×3): 1 g via INTRAVENOUS
  Filled 2023-12-12 (×3): qty 10

## 2023-12-12 MED ORDER — SODIUM CHLORIDE 0.9 % IV SOLN: INTRAVENOUS | Status: DC | PRN

## 2023-12-12 MED ORDER — LIDOCAINE 2% (20 MG/ML) 5 ML SYRINGE
INTRAMUSCULAR | Status: DC | PRN
  Administered 2023-12-12: 20 mg via INTRAVENOUS

## 2023-12-12 MED ORDER — PROPOFOL 500 MG/50ML IV EMUL
INTRAVENOUS | Status: DC | PRN
  Administered 2023-12-12: 120 ug/kg/min via INTRAVENOUS

## 2023-12-12 MED ORDER — GLYCOPYRROLATE 0.2 MG/ML IJ SOLN
INTRAMUSCULAR | Status: AC
  Filled 2023-12-12: qty 1

## 2023-12-12 MED ORDER — CHLORHEXIDINE GLUCONATE CLOTH 2 % EX PADS
6.0000 | MEDICATED_PAD | Freq: Every day | CUTANEOUS | Status: DC
  Administered 2023-12-12 – 2023-12-21 (×10): 6 via TOPICAL

## 2023-12-12 MED ORDER — GLYCOPYRROLATE 0.2 MG/ML IJ SOLN
INTRAMUSCULAR | Status: DC | PRN
  Administered 2023-12-12: .2 mg via INTRAVENOUS

## 2023-12-12 MED ORDER — LIDOCAINE HCL (PF) 2 % IJ SOLN
INTRAMUSCULAR | Status: AC
  Filled 2023-12-12: qty 5

## 2023-12-12 NOTE — Progress Notes (Signed)
 Progress Note   Patient: Joel Harrell ZOX:[id number] DOB: 1956/02/07 DOA: 12/10/2023     2 DOS: the patient was seen and examined on 12/12/2023   Brief hospital course: Joel Harrell is a 68 y.o. male with medical history significant for asthma, CHF, hypertension, alcoholic liver cirrhosis, and COPD, who presented to the emergency room with acute onset of bilateral lower extremity edema as well as abdominal distention with scrotal edema worsening over the last week. He admitted to drinking 12 pack beer per week although his family stated that he also drinks liquor.  Upon arriving to hospital, hemoglobin was 4.2.  Patient states that he had a black stool a month ago, since then, he has been having brown stools every day. Due to liver cirrhosis, patient was given Protonix , octreotide, and PRBC.  GI consult obtained.   Principal Problem:   GI bleeding Active Problems:   Alcoholic cirrhosis of liver with ascites (HCC)   Alcohol abuse   Chronic obstructive pulmonary disease (COPD) (HCC)   Essential hypertension   Pancytopenia (HCC)   Acute blood loss anemia   Iron deficiency anemia   Hypokalemia   Hyponatremia   Hypomagnesemia   Hypophosphatemia   Portal hypertension (HCC)   Assessment and Plan: GI bleeding Pancytopenia secondary to liver cirrhosis. Acute blood loss anemia secondary to GI bleed. B12 deficient anemia. Portal hypertension gastropathy.  Esophageal varices. Based on history, patient had a black stool about a month ago.  Since then, patient has been having brown stools.  Patient also has progressively worsening shortness of breath indicating drop hemoglobin was gradual. Patient had EGD performed on 6/1, showed esophageal varices and portal hypertensive gastropathy, but did not show any active bleeding.  Continue octreotide, Protonix  and Rocephin for now, patient is scheduled for colonoscopy tomorrow. Patient so far has received 4 units of PRBC and IV iron.  Hemoglobin finally  increased to 7.4 today.  Continue to follow.   Alcoholic cirrhosis of liver with ascites (HCC) Alcohol abuse. Patient has anasarca with severe leg edema.  Will start diuretics when condition is more stable.   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 Hypokalemia. Hypomagnesemia. Hypophosphatemia. Continue fluid restriction, replete potassium, magnesium  and phosphate again.   Urinary retention secondary to benign prostate hypertrophy and scrotal edema. Severe scrotal edema secondary to liver cirrhosis. Foley catheter was anchored by urology.  Continue to follow.   Essential hypertension - Will continue antihypertensive therapy.   Chronic obstructive pulmonary disease (COPD) (HCC) Will continue his inhalers.        Subjective:  Patient feels better today, no significant abdominal pain.  He denies any diarrhea.  Physical Exam: Vitals:   12/12/23 0940 12/12/23 0950 12/12/23 1002 12/12/23 1032  BP: 130/89 121/77 129/85 137/86  Pulse: (!) 103 (!) 103 (!) 103 (!) 101  Resp: 18 (!) 26 (!) 23 18  Temp: (!) 96.7 F (35.9 C)     TempSrc: Temporal     SpO2: 96% 97% 96% 98%  Weight:      Height:       General exam: Appears calm and comfortable  Respiratory system: Clear to auscultation. Respiratory effort normal. Cardiovascular system: S1 & S2 heard, RRR. No JVD, murmurs, rubs, gallops or clicks.  Gastrointestinal system: Abdomen is distended, soft and nontender. No organomegaly or masses felt. Normal bowel sounds heard. Central nervous system: Alert and oriented. No focal neurological deficits. Extremities: 2+ leg edema Skin: No rashes, lesions or ulcers Psychiatry: Judgement and insight appear normal. Mood &  affect appropriate.    Data Reviewed:  Lab results reviewed.  Family Communication: none  Disposition: Status is: Inpatient Remains inpatient appropriate because: Severity of disease, IV treatment, inpatient procedure.     Time spent: 35  minutes  Author: Donaciano Frizzle, MD 12/12/2023 10:45 AM  For on call review [email or web address].

## 2023-12-12 NOTE — Anesthesia Postprocedure Evaluation (Signed)
 Anesthesia Post Note  Patient: Joel Harrell  Procedure(s) Performed: EGD (ESOPHAGOGASTRODUODENOSCOPY)  Patient location during evaluation: Endoscopy Anesthesia Type: General Level of consciousness: awake and alert Pain management: pain level controlled Vital Signs Assessment: post-procedure vital signs reviewed and stable Respiratory status: spontaneous breathing, nonlabored ventilation, respiratory function stable and patient connected to nasal cannula oxygen Cardiovascular status: blood pressure returned to baseline and stable Postop Assessment: no apparent nausea or vomiting Anesthetic complications: no   No notable events documented.   Last Vitals:  Vitals:   12/12/23 0940 12/12/23 0950  BP: 130/89 121/77  Pulse: (!) 103 (!) 103  Resp: 18 (!) 26  Temp: (!) 35.9 C   SpO2: 96% 97%    Last Pain:  Vitals:   12/12/23 0950  TempSrc:   PainSc: 0-No pain                 Lattie Poli

## 2023-12-12 NOTE — Op Note (Signed)
 [facility] Patient Name: Joel Harrell Procedure Date: 12/12/2023 9:05 AM MRN: [id number] Account #: [id number] Date of Birth: 1956-05-10 Admit Type: Inpatient Age: 68 Room: [facility] ENDO ROOM 4 Gender: Male Note Status: Finalized Instrument Name: Upper Endoscope [id number] Procedure:             Upper GI endoscopy Indications:           Iron deficiency anemia secondary to chronic blood loss Providers:             Quintin Buckle DO, DO Referring MD:          No Local Md, MD (Referring MD) Medicines:             Monitored Anesthesia Care Complications:         No immediate complications. Estimated blood loss:                         Minimal. Procedure:             Pre-Anesthesia Assessment:                        - Prior to the procedure, a History and Physical was                         performed, and patient medications and allergies were                         reviewed. The patient is competent. The risks and                         benefits of the procedure and the sedation options and                         risks were discussed with the patient. All questions                         were answered and informed consent was obtained.                         Patient identification and proposed procedure were                         verified by the physician, the nurse, the anesthetist                         and the technician in the endoscopy suite. Mental                         Status Examination: alert and oriented. Airway                         Examination: normal oropharyngeal airway and neck                         mobility. Respiratory Examination: clear to                         auscultation. CV Examination: tachycardia noted.  Prophylactic Antibiotics: The patient does not require                         prophylactic antibiotics. Prior Anticoagulants: The                         patient has taken no anticoagulant  or antiplatelet                         agents. ASA Grade Assessment: III - A patient with                         severe systemic disease. After reviewing the risks and                         benefits, the patient was deemed in satisfactory                         condition to undergo the procedure. The anesthesia                         plan was to use monitored anesthesia care (MAC).                         Immediately prior to administration of medications,                         the patient was re-assessed for adequacy to receive                         sedatives. The heart rate, respiratory rate, oxygen                         saturations, blood pressure, adequacy of pulmonary                         ventilation, and response to care were monitored                         throughout the procedure. The physical status of the                         patient was re-assessed after the procedure.                        After obtaining informed consent, the endoscope was                         passed under direct vision. Throughout the procedure,                         the patient's blood pressure, pulse, and oxygen                         saturations were monitored continuously. The Endoscope                         was introduced through the mouth, and advanced to the  second part of duodenum. The upper GI endoscopy was                         accomplished without difficulty. The patient tolerated                         the procedure well. Findings:      Localized granular mucosa was found in the second portion of the       duodenum. Biopsies were taken with a cold forceps for histology.       Estimated blood loss was minimal.      The exam of the duodenum was otherwise normal.      Mild portal hypertensive gastropathy was found in the gastric body.       Biopsies were taken with a cold forceps for histology. Estimated blood       loss was minimal.      The  exam of the stomach was otherwise normal.      The Z-line was regular. Estimated blood loss: none.      Esophagogastric landmarks were identified: the gastroesophageal junction       was found at 40 cm from the incisors.      Grade I varices were found in the lower third of the esophagus. No signs       of red wale/nipple sign from recent bleeding, Estimated blood loss: none.      The exam of the esophagus was otherwise normal. Impression:            - Granular mucosa in the second portion of the                         duodenum. Biopsied.                        - Portal hypertensive gastropathy. Biopsied.                        - Z-line regular.                        - Esophagogastric landmarks identified.                        - Grade I esophageal varices. Recommendation:        - Patient has a contact number available for                         emergencies. The signs and symptoms of potential                         delayed complications were discussed with the patient.                         Return to normal activities tomorrow. Written                         discharge instructions were provided to the patient.                        - Return patient to hospital ward for ongoing care.                        -  Clear liquid diet.                        - Await pathology results.                        - Recommend colonoscopy tomorrow given severity of                         anemia                        - Repeat upper endoscopy in 1 year for surveillance.                        - Return to GI office at appointment to be scheduled.                        - The findings and recommendations were discussed with                         the patient. Procedure Code(s):     --- Professional ---                        [phone], Esophagogastroduodenoscopy, flexible,                         transoral; with biopsy, single or multiple Diagnosis Code(s):     --- Professional ---                         K31.89, Other diseases of stomach and duodenum                        K76.6, Portal hypertension                        I85.00, Esophageal varices without bleeding                        D50.0, Iron deficiency anemia secondary to blood loss                         (chronic) CPT copyright 2022 American Medical Association. All rights reserved. The codes documented in this report are preliminary and upon coder review may  be revised to meet current compliance requirements. Attending Participation:      I personally performed the entire procedure. Polo Brisk, DO Quintin Buckle DO, DO 12/12/2023 9:43:06 AM This report has been signed electronically. Number of Addenda: 0 Note Initiated On: 12/12/2023 9:05 AM Estimated Blood Loss:  Estimated blood loss was minimal.      [facility]

## 2023-12-12 NOTE — Plan of Care (Signed)

## 2023-12-12 NOTE — Care Plan (Signed)
 Brief GI Post Op Note  EGD performed Two esophageal varices with no signs of recent bleeding. Flattened with insufflation Portal htn gastropathy and duodenopathy  Given severity of anemia, recommend colonoscopy tomorrow. NPO at midnight Clear liquids now Labs in am- cmp, cbc, inr Continue Protonix  40 mg po q12 h Hold dvt ppx Can discontinue octreotide Ceftriaxone 1 g iv q24 for 7 days total for sbp ppx Monitor H&H.  Transfusion and resuscitation as per primary team Avoid frequent lab draws to prevent lab induced anemia Supportive care and antiemetics as per primary team Maintain two sites IV access Avoid nsaids Monitor for GIB.  Electrolyte correction as per primary team Once electrolytes are stable, can consider adding spironolactone 50mg  and lasix 20mg  to start with goal of 100mg  and 40 mg respectively. Given anasarca, would likely need lasix iv at first with gut edema preventing adequate absorption  GI will continue to follow. Dr. Ole Berkeley will be taking over the service tomorrow (6/2)  Jorje Newton, DO [facility]

## 2023-12-12 NOTE — Progress Notes (Signed)
 PT Cancellation Note  Patient Details Name: Joel Harrell MRN: [id number] DOB: Aug 05, 1955   Cancelled Treatment:    Reason Eval/Treat Not Completed: Other (comment). Pt out of room at this time, PT to re-attempt as able.    Darien Eden PT, DPT 10:19 AM,12/12/23

## 2023-12-12 NOTE — Interval H&P Note (Addendum)
 History and Physical Interval Note: Consult note from 12/11/23 was reviewed and there was no interval change in physical exam after seeing and examining the patient aside from foley placement.  Written consent was obtained from the patient after discussion of risks, benefits, and alternatives. Patient has consented to proceed with Esophagogastroduodenoscopy with possible intervention. Labs from this morning reviewed. Hgb >7. INR 2 this morning and patient administered vitamin K. Current MELD3.0 is 24  Esophagogastroduodenoscopy with possible biopsy, control of bleeding, polypectomy, and interventions as necessary has been discussed with the patient/patient representative. Informed consent was obtained from the patient/patient representative after explaining the indication, nature, and risks of the procedure including but not limited to death, bleeding, perforation, missed neoplasm/lesions, cardiorespiratory compromise, and reaction to medications. Opportunity for questions was given and appropriate answers were provided. Patient/patient representative has verbalized understanding is amenable to undergoing the procedure.  12/12/2023 9:03 AM  Joel Harrell  has presented today for surgery, with the diagnosis of severe symptomatic anemia.  The various methods of treatment have been discussed with the patient and family. After consideration of risks, benefits and other options for treatment, the patient has consented to  Procedure(s): EGD (ESOPHAGOGASTRODUODENOSCOPY) (N/A) as a surgical intervention.  The patient's history has been reviewed, patient examined, no change in status, stable for surgery.  I have reviewed the patient's chart and labs.  Questions were answered to the patient's satisfaction.     Quintin Buckle

## 2023-12-12 NOTE — Transfer of Care (Signed)
 Immediate Anesthesia Transfer of Care Note  Patient: Joel Harrell  Procedure(s) Performed: EGD (ESOPHAGOGASTRODUODENOSCOPY)  Patient Location: Endoscopy Unit  Anesthesia Type:General  Level of Consciousness: drowsy  Airway & Oxygen Therapy: Patient Spontanous Breathing  Post-op Assessment: Report given to RN and Post -op Vital signs reviewed and stable  Post vital signs: Reviewed  Last Vitals:  Vitals Value Taken Time  BP 130/85   Temp    Pulse 102   Resp 16   SpO2 95     Last Pain:  Vitals:   12/12/23 0913  TempSrc:   PainSc: 0-No pain         Complications: No notable events documented.

## 2023-12-12 NOTE — Evaluation (Signed)
 Physical Therapy Evaluation Patient Details Name: Joel Harrell MRN: [id number] DOB: 02-16-1956 Today's Date: 12/12/2023  History of Present Illness  Pt is a 68 yo male admitted for potential GI bleed, LE edema. PMH of asthma, CHF, HTN, etoh.  Clinical Impression  Patient alert, oriented x4, reported ongoing BLE pain with swelling (mild pain signs/symptoms with mobility). Per pt at baseline he moves between his cousins home and an apartment with his fiance (more often at apartment). Normally ambulatory without AD, performs cleaning, denies recent falls.  He was able to transition to sitting EOB with supervision, use of bed rails. Sit <> stand after a rest break CGA with RW, verbal cues for hand placement. He was able to ambulate ~36ft, shuffled gait and very wide BOS, educated on importance of feet placement with RW use. Pt did report some fatigue with activity and requested a breathing treatment (used to getting these at home) RN notified and respiratory in room at end of session.  Overall the patient demonstrated deficits (see "PT Problem List") that impede the patient's functional abilities, safety, and mobility and would benefit from skilled PT intervention.          If plan is discharge home, recommend the following: A little help with bathing/dressing/bathroom;Assistance with cooking/housework;A little help with walking and/or transfers;Assist for transportation;Help with stairs or ramp for entrance   Can travel by private vehicle        Equipment Recommendations Rolling walker (2 wheels);BSC/3in1  Recommendations for Other Services       Functional Status Assessment Patient has had a recent decline in their functional status and demonstrates the ability to make significant improvements in function in a reasonable and predictable amount of time.     Precautions / Restrictions Precautions Precautions: Fall Recall of Precautions/Restrictions: Intact Restrictions Weight Bearing  Restrictions Per Provider Order: No      Mobility  Bed Mobility Overal bed mobility: Needs Assistance Bed Mobility: Supine to Sit     Supine to sit: Used rails, HOB elevated, Supervision     General bed mobility comments: extra time    Transfers Overall transfer level: Needs assistance Equipment used: Rolling walker (2 wheels) Transfers: Sit to/from Stand Sit to Stand: Contact guard assist           General transfer comment: from low surface, verbal cues for hand placement    Ambulation/Gait Ambulation/Gait assistance: Contact guard assist Gait Distance (Feet): 65 Feet Assistive device: Rolling walker (2 wheels)         General Gait Details: wide BOS, often had to remind him of feet placement in regards to RW. shuffled gait  Stairs            Wheelchair Mobility     Tilt Bed    Modified Rankin (Stroke Patients Only)       Balance Overall balance assessment: Needs assistance Sitting-balance support: Feet supported Sitting balance-[name]: Good     Standing balance support: During functional activity, Bilateral upper extremity supported Standing balance-[name]: Fair                               Pertinent Vitals/Pain Pain Assessment Pain Assessment: Faces Faces Pain Scale: Hurts a little bit Pain Location: BLE with swelling Pain Descriptors / Indicators: Sore Pain Intervention(s): Limited activity within patient's tolerance, Monitored during session, Repositioned    Home Living Family/patient expects to be discharged to:: Private residence Living Arrangements: Spouse/significant other Available  Help at Discharge: Family Type of Home: Apartment (pt stated he goes between his cousins house and an apartment with his fiance; more often at apartment) Home Access: Level entry       Home Layout: One level Home Equipment: None      Prior Function Prior Level of Function : Independent/Modified Independent              Mobility Comments: pt stated family was helping with socks lately due to leg swelling ADLs Comments: pt was doing the cleaning up until his increased edema. fiance does driving, cooking     Extremity/Trunk Assessment        Lower Extremity Assessment Lower Extremity Assessment: Generalized weakness (able to lift BLE against gravity)       Communication        Cognition Arousal: Alert Behavior During Therapy: WFL for tasks assessed/performed   PT - Cognitive impairments: No apparent impairments                         Following commands: Intact       Cueing       General Comments      Exercises     Assessment/Plan    PT Assessment Patient needs continued PT services  PT Problem List Decreased strength;Decreased range of motion;Decreased activity tolerance;Decreased balance;Decreased mobility;Decreased knowledge of use of DME       PT Treatment Interventions DME instruction;Balance training;Gait training;Neuromuscular re-education;Stair training;Functional mobility training;Patient/family education;Therapeutic activities;Therapeutic exercise    PT Goals (Current goals can be found in the Care Plan section)  Acute Rehab PT Goals Patient Stated Goal: to be less swollen PT Goal Formulation: With patient Time For Goal Achievement: 12/26/23 Potential to Achieve Goals: Good    Frequency Min 2X/week     Co-evaluation               AM-PAC PT "6 Clicks" Mobility  Outcome Measure Help needed turning from your back to your side while in a flat bed without using bedrails?: A Little Help needed moving from lying on your back to sitting on the side of a flat bed without using bedrails?: A Little Help needed moving to and from a bed to a chair (including a wheelchair)?: A Little Help needed standing up from a chair using your arms (e.g., wheelchair or bedside chair)?: A Little Help needed to walk in hospital room?: A Little Help needed climbing 3-5  steps with a railing? : A Little 6 Click Score: 18    End of Session   Activity Tolerance: Patient tolerated treatment well Patient left: in chair;with call bell/phone within reach;with chair alarm set Nurse Communication: Mobility status PT Visit Diagnosis: Other abnormalities of gait and mobility (R26.89);Difficulty in walking, not elsewhere classified (R26.2);Muscle weakness (generalized) (M62.81)    Time: 1610-9604 PT Time Calculation (min) (ACUTE ONLY): 22 min   Charges:   PT Evaluation $PT Eval Low Complexity: 1 Low PT Treatments $Therapeutic Activity: 8-22 mins PT General Charges $$ ACUTE PT VISIT: 1 Visit        Darien Eden PT, DPT 3:38 PM,12/12/23

## 2023-12-12 NOTE — Plan of Care (Signed)

## 2023-12-12 NOTE — Anesthesia Preprocedure Evaluation (Signed)
 Anesthesia Evaluation  Patient identified by MRN, date of birth, ID band Patient awake  General Assessment Comment:  Patient AO x 3. Generalized edema, prominent in abdomen and lower extremities.  Reviewed: Allergy & Precautions, NPO status , Patient's Chart, lab work & pertinent test results  History of Anesthesia Complications Negative for: history of anesthetic complications  Airway Mallampati: III  TM Distance: >3 FB Neck ROM: Full    Dental  (+) Poor Dentition, Missing   Pulmonary neg sleep apnea, COPD,  COPD inhaler, Patient abstained from smoking.Not current smoker   Pulmonary exam normal breath sounds clear to auscultation       Cardiovascular Exercise Tolerance: Good METShypertension, Pt. on medications +CHF  (-) CAD and (-) Past MI (-) dysrhythmias  Rhythm:Regular Rate:Normal - Systolic murmurs    Neuro/Psych negative neurological ROS  negative psych ROS   GI/Hepatic ,neg GERD  ,,(+) Cirrhosis   ascites  substance abuse  alcohol useDistended abdomen. Non tender, somewhat tense, no fluid shifts appreciated. Denies respiratory issues or nausea/vomiting   Endo/Other  neg diabetes    Renal/GU negative Renal ROS     Musculoskeletal   Abdominal   Peds  Hematology   Anesthesia Other Findings Past Medical History: No date: Asthma No date: CHF (congestive heart failure) (HCC) No date: Hypertension  Reproductive/Obstetrics                             Anesthesia Physical Anesthesia Plan  ASA: 3  Anesthesia Plan: General   Post-op Pain Management: Minimal or no pain anticipated   Induction: Intravenous  PONV Risk Score and Plan: 2 and Propofol infusion, TIVA, Ondansetron  and Treatment may vary due to age or medical condition  Airway Management Planned: Nasal Cannula  Additional Equipment: None  Intra-op Plan:   Post-operative Plan:   Informed Consent: I have reviewed  the patients History and Physical, chart, labs and discussed the procedure including the risks, benefits and alternatives for the proposed anesthesia with the patient or authorized representative who has indicated his/her understanding and acceptance.     Dental advisory given  Plan Discussed with: CRNA and Surgeon  Anesthesia Plan Comments: (Discussed risks of anesthesia with patient, including possibility of difficulty with spontaneous ventilation under anesthesia necessitating airway intervention, PONV, and rare risks such as cardiac or respiratory or neurological events, and allergic reactions. Discussed the role of CRNA in patient's perioperative care. Patient understands.)       Anesthesia Quick Evaluation

## 2023-12-13 ENCOUNTER — Encounter: Payer: Self-pay | Admitting: Gastroenterology

## 2023-12-13 ENCOUNTER — Inpatient Hospital Stay: Admitting: Radiology

## 2023-12-13 ENCOUNTER — Inpatient Hospital Stay

## 2023-12-13 ENCOUNTER — Encounter
Admission: EM | Disposition: A | Discharge status: Home Health | Payer: Self-pay | Source: Home / Self Care | Attending: Hospitalist

## 2023-12-13 ENCOUNTER — Inpatient Hospital Stay: Admitting: Anesthesiology

## 2023-12-13 DIAGNOSIS — D125 Benign neoplasm of sigmoid colon: Secondary | ICD-10-CM | POA: Diagnosis not present

## 2023-12-13 DIAGNOSIS — N401 Enlarged prostate with lower urinary tract symptoms: Secondary | ICD-10-CM

## 2023-12-13 DIAGNOSIS — D509 Iron deficiency anemia, unspecified: Secondary | ICD-10-CM

## 2023-12-13 DIAGNOSIS — K921 Melena: Secondary | ICD-10-CM | POA: Diagnosis not present

## 2023-12-13 DIAGNOSIS — R338 Other retention of urine: Secondary | ICD-10-CM

## 2023-12-13 DIAGNOSIS — E871 Hypo-osmolality and hyponatremia: Secondary | ICD-10-CM

## 2023-12-13 DIAGNOSIS — D62 Acute posthemorrhagic anemia: Secondary | ICD-10-CM | POA: Diagnosis not present

## 2023-12-13 DIAGNOSIS — K922 Gastrointestinal hemorrhage, unspecified: Secondary | ICD-10-CM

## 2023-12-13 DIAGNOSIS — K7031 Alcoholic cirrhosis of liver with ascites: Secondary | ICD-10-CM | POA: Diagnosis not present

## 2023-12-13 DIAGNOSIS — D123 Benign neoplasm of transverse colon: Secondary | ICD-10-CM

## 2023-12-13 DIAGNOSIS — D490 Neoplasm of unspecified behavior of digestive system: Secondary | ICD-10-CM

## 2023-12-13 DIAGNOSIS — K6389 Other specified diseases of intestine: Secondary | ICD-10-CM | POA: Insufficient documentation

## 2023-12-13 DIAGNOSIS — D122 Benign neoplasm of ascending colon: Secondary | ICD-10-CM

## 2023-12-13 DIAGNOSIS — E663 Overweight: Secondary | ICD-10-CM | POA: Insufficient documentation

## 2023-12-13 DIAGNOSIS — C187 Malignant neoplasm of sigmoid colon: Principal | ICD-10-CM

## 2023-12-13 HISTORY — PX: POLYPECTOMY: SHX149

## 2023-12-13 HISTORY — PX: HEMOSTASIS CLIP PLACEMENT: SHX6857

## 2023-12-13 HISTORY — PX: IR ANGIOGRAM PELVIS SELECTIVE OR SUPRASELECTIVE: IMG661

## 2023-12-13 HISTORY — PX: COLONOSCOPY: SHX5424

## 2023-12-13 LAB — BASIC METABOLIC PANEL WITH GFR
Anion gap: 6 (ref 5–15)
BUN: 10 mg/dL (ref 8–23)
CO2: 24 mmol/L (ref 22–32)
Calcium: 7.6 mg/dL — ABNORMAL LOW (ref 8.9–10.3)
Chloride: 104 mmol/L (ref 98–111)
Creatinine, Ser: 0.84 mg/dL (ref 0.61–1.24)
GFR, Estimated: >60 mL/min (ref >60)
Glucose, Bld: 108 mg/dL — ABNORMAL HIGH (ref 70–99)
Potassium: 3.9 mmol/L (ref 3.5–5.1)
Sodium: 134 mmol/L — ABNORMAL LOW (ref 135–145)

## 2023-12-13 LAB — CBC
HCT: 26.3 % — ABNORMAL LOW (ref 39.0–52.0)
Hemoglobin: 7.6 g/dL — ABNORMAL LOW (ref 13.0–17.0)
MCH: 20.9 pg — ABNORMAL LOW (ref 26.0–34.0)
MCHC: 28.9 g/dL — ABNORMAL LOW (ref 30.0–36.0)
MCV: 72.5 fL — ABNORMAL LOW (ref 80.0–100.0)
Platelets: 132 10*3/uL — ABNORMAL LOW (ref 150–400)
RBC: 3.63 MIL/uL — ABNORMAL LOW (ref 4.22–5.81)
RDW: 27.2 % — ABNORMAL HIGH (ref 11.5–15.5)
WBC: 4.3 10*3/uL (ref 4.0–10.5)
nRBC: 0 % (ref 0.0–0.2)

## 2023-12-13 LAB — URINALYSIS, COMPLETE (UACMP) WITH MICROSCOPIC
Bilirubin Urine: NEGATIVE
Glucose, UA: NEGATIVE mg/dL
Ketones, ur: NEGATIVE mg/dL
Leukocytes,Ua: NEGATIVE
Nitrite: NEGATIVE
Protein, ur: 30 mg/dL — AB
Specific Gravity, Urine: 1.019 (ref 1.005–1.030)
Squamous Epithelial / HPF: 0 /HPF (ref 0–5)
pH: 5 (ref 5.0–8.0)

## 2023-12-13 LAB — HEMOGLOBIN
Hemoglobin: 8.1 g/dL — ABNORMAL LOW (ref 13.0–17.0)
Hemoglobin: 7.4 g/dL — ABNORMAL LOW (ref 13.0–17.0)

## 2023-12-13 LAB — PROTIME-INR
INR: 1.7 — ABNORMAL HIGH (ref 0.8–1.2)
Prothrombin Time: 20.1 s — ABNORMAL HIGH (ref 11.4–15.2)

## 2023-12-13 LAB — PREPARE RBC (CROSSMATCH)

## 2023-12-13 LAB — MAGNESIUM: Magnesium: 1.7 mg/dL (ref 1.7–2.4)

## 2023-12-13 LAB — PHOSPHORUS: Phosphorus: 2.5 mg/dL (ref 2.5–4.6)

## 2023-12-13 SURGERY — COLONOSCOPY
Anesthesia: General

## 2023-12-13 MED ORDER — PHENYLEPHRINE 80 MCG/ML (10ML) SYRINGE FOR IV PUSH (FOR BLOOD PRESSURE SUPPORT)
PREFILLED_SYRINGE | INTRAVENOUS | Status: AC
  Filled 2023-12-13: qty 10

## 2023-12-13 MED ORDER — LIDOCAINE HCL 1 % IJ SOLN
5.0000 mL | Freq: Once | INTRAMUSCULAR | Status: AC
  Administered 2023-12-13: 5 mL via INTRADERMAL
  Filled 2023-12-13: qty 5

## 2023-12-13 MED ORDER — PROPOFOL 500 MG/50ML IV EMUL
INTRAVENOUS | Status: DC | PRN
  Administered 2023-12-13: 50 mg via INTRAVENOUS
  Administered 2023-12-13: 100 ug/kg/min via INTRAVENOUS

## 2023-12-13 MED ORDER — FENTANYL CITRATE (PF) 100 MCG/2ML IJ SOLN
INTRAMUSCULAR | Status: AC | PRN
  Administered 2023-12-13: 25 ug via INTRAVENOUS

## 2023-12-13 MED ORDER — FLEET ENEMA RE ENEM
1.0000 | ENEMA | Freq: Every day | RECTAL | Status: DC | PRN
  Administered 2023-12-13: 1 via RECTAL

## 2023-12-13 MED ORDER — SPOT INK MARKER SYRINGE KIT
PACK | SUBMUCOSAL | Status: DC | PRN
  Administered 2023-12-13: 5 mL via SUBMUCOSAL

## 2023-12-13 MED ORDER — PROPOFOL 10 MG/ML IV BOLUS
INTRAVENOUS | Status: AC
Start: 2023-12-13 — End: ?
  Filled 2023-12-13: qty 20

## 2023-12-13 MED ORDER — LIDOCAINE HCL (CARDIAC) PF 100 MG/5ML IV SOSY
PREFILLED_SYRINGE | INTRAVENOUS | Status: DC | PRN
  Administered 2023-12-13: 100 mg via INTRAVENOUS

## 2023-12-13 MED ORDER — FENTANYL CITRATE (PF) 100 MCG/2ML IJ SOLN
INTRAMUSCULAR | Status: AC
  Filled 2023-12-13: qty 2

## 2023-12-13 MED ORDER — LIDOCAINE HCL (PF) 2 % IJ SOLN
INTRAMUSCULAR | Status: AC
  Filled 2023-12-13: qty 5

## 2023-12-13 MED ORDER — MAGNESIUM SULFATE 2 GM/50ML IV SOLN
2.0000 g | Freq: Once | INTRAVENOUS | Status: AC
  Administered 2023-12-13: 2 g via INTRAVENOUS
  Filled 2023-12-13 (×2): qty 50

## 2023-12-13 MED ORDER — SODIUM CHLORIDE 0.9 % IV SOLN: INTRAVENOUS | Status: DC

## 2023-12-13 MED ORDER — LIDOCAINE HCL 1 % IJ SOLN
INTRAMUSCULAR | Status: AC
  Filled 2023-12-13: qty 20

## 2023-12-13 MED ORDER — EPHEDRINE 5 MG/ML INJ
INTRAVENOUS | Status: AC
  Filled 2023-12-13: qty 5

## 2023-12-13 MED ORDER — IOHEXOL 300 MG/ML  SOLN
100.0000 mL | Freq: Once | INTRAMUSCULAR | Status: AC | PRN
  Administered 2023-12-13: 85 mL

## 2023-12-13 MED ORDER — ACETAMINOPHEN 325 MG PO TABS
650.0000 mg | ORAL_TABLET | Freq: Once | ORAL | Status: AC
Start: 2023-12-13 — End: 2023-12-14
  Administered 2023-12-14: 650 mg via ORAL
  Filled 2023-12-13: qty 2

## 2023-12-13 MED ORDER — IOHEXOL 350 MG/ML SOLN
100.0000 mL | Freq: Once | INTRAVENOUS | Status: AC | PRN
  Administered 2023-12-13: 100 mL via INTRAVENOUS

## 2023-12-13 MED ORDER — BISACODYL 5 MG PO TBEC
10.0000 mg | DELAYED_RELEASE_TABLET | Freq: Two times a day (BID) | ORAL | Status: DC | PRN
  Administered 2023-12-13: 10 mg via ORAL
  Filled 2023-12-13: qty 2

## 2023-12-13 MED ORDER — PROPOFOL 10 MG/ML IV BOLUS
INTRAVENOUS | Status: AC
  Filled 2023-12-13: qty 40

## 2023-12-13 MED ORDER — ALBUMIN HUMAN 25 % IV SOLN
25.0000 g | Freq: Once | INTRAVENOUS | Status: AC
  Administered 2023-12-14: 25 g via INTRAVENOUS
  Filled 2023-12-13: qty 100

## 2023-12-13 MED ORDER — PROPOFOL 1000 MG/100ML IV EMUL
INTRAVENOUS | Status: AC
  Filled 2023-12-13: qty 200

## 2023-12-13 MED ORDER — SODIUM CHLORIDE 0.9% IV SOLUTION: Freq: Once | INTRAVENOUS | Status: AC

## 2023-12-13 NOTE — Progress Notes (Addendum)
 Colonoscopy showed sigmoid mass with oozing.  Discussed with Dr. Cornel Diesel from general surgery, patient needs to be in the facility with advanced liver program. Call the [facility], try to transfer patient.  In the meantime, continue monitor hemoglobin and transfuse as needed.  1630. Having fresh red blood per rectum, discussed with IR, will obtain CTA for possible embolization.   1738.  [facility] called back, Dr. Hanna Lewandowsky has reviewed ultrasound. Based on patient history, he is too risky for surgery. Transfer declined. Recommended palliative care/hospice.

## 2023-12-13 NOTE — Care Management Important Message (Signed)
 Important Message  Patient Details  Name: Joel Harrell MRN: [id number] Date of Birth: 02-11-1956   Important Message Given:  Yes - Medicare IM     Anise Kerns 12/13/2023, 1:02 PM

## 2023-12-13 NOTE — Evaluation (Signed)
 Occupational Therapy Evaluation Patient Details Name: Joel Harrell MRN: [id number] DOB: 03-02-1956 Today's Date: 12/13/2023   History of Present Illness   Pt is a 68 yo male admitted for potential GI bleed, LE edema. PMH of asthma, CHF, HTN, etoh.     Clinical Impressions Pt was seen for OT evaluation this date. PTA, pt reports living in an apartment or his cousin's home with his fiance. Reports being independent with all tasks, has needed more LB assist and assist with IADLs/cleaning d/t BLE swelling and pain. Pt presents to acute OT demonstrating impaired ADL performance and functional mobility 2/2 weakness, low activity tolerance, balance deficits and pain. Pt currently requires supervision for bed mobility with increased time/effort. CGA needed for STS from EOB and for SPT bed<>BSC without use of AD. Pt able to perform posterior hygiene seated on BSC with supervision. Wide BOS noted when up on his feet and transferring. Fatigues easily with activity.  Pt would benefit from skilled OT services to address noted impairments and functional limitations to maximize safety and independence while minimizing falls risk and caregiver burden. Do anticipate the need for follow up OT services upon acute hospital DC.    If plan is discharge home, recommend the following:   A little help with walking and/or transfers;A little help with bathing/dressing/bathroom;Help with stairs or ramp for entrance;Assistance with cooking/housework     Functional Status Assessment   Patient has had a recent decline in their functional status and demonstrates the ability to make significant improvements in function in a reasonable and predictable amount of time.     Equipment Recommendations   BSC/3in1;Other (comment) (RW)     Recommendations for Other Services         Precautions/Restrictions   Precautions Precautions: Fall Recall of Precautions/Restrictions: Intact Restrictions Weight Bearing  Restrictions Per Provider Order: No     Mobility Bed Mobility Overal bed mobility: Needs Assistance Bed Mobility: Supine to Sit, Sit to Supine     Supine to sit: Used rails, HOB elevated, Supervision Sit to supine: Supervision   General bed mobility comments: increased time/effort    Transfers Overall transfer level: Needs assistance Equipment used: Rolling walker (2 wheels) Transfers: Sit to/from Stand Sit to Stand: Contact guard assist           General transfer comment: from EOB at lowest height and for SPT without RW use for urgency to [facility] and back to bed; performed seated posterior hygiene with supervision      Balance Overall balance assessment: Needs assistance Sitting-balance support: Feet supported Sitting balance-[name]: Good     Standing balance support: During functional activity, Single extremity supported Standing balance-[name]: Fair Standing balance comment: CGA                           ADL either performed or assessed with clinical judgement   ADL Overall ADL's : Needs assistance/impaired                         Toilet Transfer: Contact guard assist;BSC/3in1;Stand-pivot   Toileting- Clothing Manipulation and Hygiene: Supervision/safety;Sitting/lateral lean;Sit to/from stand       Functional mobility during ADLs: Contact guard assist       Vision         Perception         Praxis         Pertinent Vitals/Pain Pain Assessment Pain Assessment: 0-10 Pain Score: 9  Pain Location: BLE with swelling Pain Descriptors / Indicators: Sore Pain Intervention(s): Limited activity within patient's tolerance, Monitored during session, Repositioned     Extremity/Trunk Assessment Upper Extremity Assessment Upper Extremity Assessment: Overall WFL for tasks assessed   Lower Extremity Assessment Lower Extremity Assessment: Generalized weakness       Communication Communication Communication: No apparent  difficulties   Cognition Arousal: Alert Behavior During Therapy: WFL for tasks assessed/performed                                 Following commands: Intact       Cueing  General Comments          Exercises Other Exercises Other Exercises: Edu on role of OT in acute setting.   Shoulder Instructions      Home Living Family/patient expects to be discharged to:: Private residence Living Arrangements: Spouse/significant other Available Help at Discharge: Family Type of Home: Apartment (pt stated he goes between his cousins house and an apartment with his fiance; more often at apartment) Home Access: Level entry     Home Layout: One level     Bathroom Shower/Tub: Tub/shower unit         Home Equipment: Grab bars - tub/shower          Prior Functioning/Environment Prior Level of Function : Independent/Modified Independent             Mobility Comments: pt stated family was helping with socks lately due to leg swelling ADLs Comments: pt was doing the cleaning up until his increased edema. fiance does driving, cooking    OT Problem List: Decreased strength;Impaired balance (sitting and/or standing);Decreased activity tolerance;Pain   OT Treatment/Interventions: Self-care/ADL training;Therapeutic exercise;Therapeutic activities;Energy conservation;Patient/family education;DME and/or AE instruction;Balance training      OT Goals(Current goals can be found in the care plan section)   Acute Rehab OT Goals Patient Stated Goal: improve strength OT Goal Formulation: With patient Time For Goal Achievement: 12/27/23 Potential to Achieve Goals: Good ADL Goals Pt Will Perform Lower Body Bathing: with supervision;with modified independence;sitting/lateral leans;sit to/from stand Pt Will Perform Lower Body Dressing: with supervision;with modified independence;sitting/lateral leans;sit to/from stand Pt Will Transfer to Toilet: with supervision;with  modified independence;ambulating;regular height toilet Pt Will Perform Toileting - Clothing Manipulation and hygiene: with supervision;with modified independence;sit to/from stand;sitting/lateral leans   OT Frequency:  Min 2X/week    Co-evaluation              AM-PAC OT "6 Clicks" Daily Activity     Outcome Measure Help from another person eating meals?: None Help from another person taking care of personal grooming?: None Help from another person toileting, which includes using toliet, bedpan, or urinal?: A Little Help from another person bathing (including washing, rinsing, drying)?: A Little Help from another person to put on and taking off regular upper body clothing?: None Help from another person to put on and taking off regular lower body clothing?: A Little 6 Click Score: 21   End of Session Equipment Utilized During Treatment: Gait belt Nurse Communication: Mobility status  Activity Tolerance: Patient tolerated treatment well Patient left: with call bell/phone within reach;in bed;with bed alarm set;with nursing/sitter in room  OT Visit Diagnosis: Other abnormalities of gait and mobility (R26.89);Muscle weakness (generalized) (M62.81)                Time: 1610-9604 OT Time Calculation (min): 27 min Charges:  OT General  Charges $OT Visit: 1 Visit OT Evaluation $OT Eval Moderate Complexity: 1 Mod OT Treatments $Self Care/Home Management : 8-22 mins Donia Yokum, OTR/L  12/13/23, 12:08 PM  Tranise Forrest E Hatsumi Steinhart 12/13/2023, 12:05 PM

## 2023-12-13 NOTE — Progress Notes (Signed)
 The patient had a colonoscopy for anemia and was found to have multiple polyps in the colon that were removed and a sigmoid mass that was tattooed.  The patient also was complaining of tense ascites.  I am not sure that the patient is a surgical candidate at our hospital due to his advanced liver disease and risk.  There is nothing further to do from a GI point of view for his GI bleeding in light of the mass being the source of the bleeding.  I will sign off.  Please call if any further GI concerns or questions.  We would like to thank you for the opportunity to participate in the care of Joel Harrell.

## 2023-12-13 NOTE — Plan of Care (Signed)

## 2023-12-13 NOTE — Progress Notes (Signed)
   12/13/23 1515  Spiritual Encounters  Type of Visit Initial  Care provided to: Pt and family  Referral source Chaplain assessment  Reason for visit Routine spiritual support  OnCall Visit Yes  Spiritual Framework  Presenting Themes Meaning/purpose/sources of inspiration;Coping tools;Impactful experiences and emotions  Interventions  Spiritual Care Interventions Made Compassionate presence;Reflective listening;Established relationship of care and support;Prayer;Encouragement  Intervention Outcomes  Outcomes Connection to spiritual care;Awareness around self/spiritual resourses;Connection to values and goals of care;Awareness of support;Reduced anxiety

## 2023-12-13 NOTE — Anesthesia Preprocedure Evaluation (Signed)
 Anesthesia Evaluation  Patient identified by MRN, date of birth, ID band Patient awake  General Assessment Comment:  Patient AO x 3. Generalized edema, prominent in abdomen and lower extremities.  Reviewed: Allergy & Precautions, NPO status , Patient's Chart, lab work & pertinent test results  History of Anesthesia Complications Negative for: history of anesthetic complications  Airway Mallampati: III  TM Distance: >3 FB Neck ROM: Full    Dental  (+) Poor Dentition, Missing, Dental Advidsory Given   Pulmonary neg sleep apnea, COPD,  COPD inhaler, Patient abstained from smoking.Not current smoker   Pulmonary exam normal breath sounds clear to auscultation       Cardiovascular Exercise Tolerance: Good METShypertension, Pt. on medications +CHF  (-) CAD and (-) Past MI (-) dysrhythmias (-) Valvular Problems/Murmurs Rhythm:Regular Rate:Normal - Systolic murmurs    Neuro/Psych negative neurological ROS  negative psych ROS   GI/Hepatic ,neg GERD  ,,(+) Cirrhosis   ascites  substance abuse  alcohol useDistended abdomen. Non tender, somewhat tense, no fluid shifts appreciated. Denies respiratory issues or nausea/vomiting   Endo/Other  neg diabetes    Renal/GU negative Renal ROS     Musculoskeletal   Abdominal   Peds  Hematology  (+) Blood dyscrasia, anemia   Anesthesia Other Findings Past Medical History: No date: Asthma No date: CHF (congestive heart failure) (HCC) No date: Hypertension  Reproductive/Obstetrics                             Anesthesia Physical Anesthesia Plan  ASA: 3  Anesthesia Plan: General   Post-op Pain Management: Minimal or no pain anticipated   Induction: Intravenous  PONV Risk Score and Plan: 2 and Propofol infusion, TIVA, Treatment may vary due to age or medical condition and Ondansetron   Airway Management Planned: Nasal Cannula and Natural  Airway  Additional Equipment: None  Intra-op Plan:   Post-operative Plan:   Informed Consent: I have reviewed the patients History and Physical, chart, labs and discussed the procedure including the risks, benefits and alternatives for the proposed anesthesia with the patient or authorized representative who has indicated his/her understanding and acceptance.     Dental advisory given  Plan Discussed with: CRNA and Surgeon  Anesthesia Plan Comments: (Discussed risks of anesthesia with patient, including possibility of difficulty with spontaneous ventilation under anesthesia necessitating airway intervention, PONV, and rare risks such as cardiac or respiratory or neurological events, and allergic reactions. Discussed the role of CRNA in patient's perioperative care. Patient understands.)       Anesthesia Quick Evaluation

## 2023-12-13 NOTE — Progress Notes (Signed)
 Progress Note   Patient: Joel Harrell MVH:[id number] DOB: 08/26/1955 DOA: 12/10/2023     3 DOS: the patient was seen and examined on 12/13/2023   Brief hospital course: Joel Harrell is a 68 y.o. male with medical history significant for asthma, CHF, hypertension, alcoholic liver cirrhosis, and COPD, who presented to the emergency room with acute onset of bilateral lower extremity edema as well as abdominal distention with scrotal edema worsening over the last week. He admitted to drinking 12 pack beer per week although his family stated that he also drinks liquor.  Upon arriving to hospital, hemoglobin was 4.2.  Patient states that he had a black stool a month ago, since then, he has been having brown stools every day. Due to liver cirrhosis, patient was given Protonix , octreotide, and PRBC.  GI consult obtained.  EGD showed portal hypertension gastropathy, esophageal varices.  No active bleeding.   Principal Problem:   GI bleeding Active Problems:   Alcoholic cirrhosis of liver with ascites (HCC)   Alcohol abuse   COPD (chronic obstructive pulmonary disease) (HCC)   Chronic obstructive pulmonary disease (COPD) (HCC)   Essential hypertension   Pancytopenia (HCC)   Acute blood loss anemia   Iron deficiency anemia   Hypokalemia   Hyponatremia   Hypomagnesemia   Hypophosphatemia   Portal hypertension (HCC)   Overweight (BMI 25.0-29.9)   Assessment and Plan: Acute upper GI bleeding Pancytopenia secondary to liver cirrhosis. Acute blood loss anemia secondary to GI bleed. Iron deficient anemia.  No B12 deficiency. Portal hypertension gastropathy.  Esophageal varices. Based on history, patient had a black stool about a month ago.  Since then, patient has been having brown stools.  Patient also has progressively worsening shortness of breath indicating drop hemoglobin was gradual. Patient had EGD performed on 6/1, showed esophageal varices and portal hypertensive gastropathy, but did not  show any active bleeding.  Continue octreotide, Protonix  and Rocephin for now, patient is scheduled for colonoscopy tomorrow. Patient so far has received 4 units of PRBC and IV iron.  Hemoglobin has been relatively stable.  Patient had large amount of stools after bowel prep for colonoscopy, did not see any black stool or bleeding. Colonoscopy is scheduled for today.    Alcoholic cirrhosis of liver with ascites (HCC) Alcohol abuse. Patient has anasarca with severe leg edema.  Will start diuretics when condition is more stable. Will obtain paracentesis to prepare for discharge.   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 Hypokalemia. Hypomagnesemia. Hypophosphatemia. Continue fluid restriction, most the condition improved.  Magnesium  1.7, give another 2 g of magnesium  sulfate.   Urinary retention secondary to benign prostate hypertrophy and scrotal edema. Severe scrotal edema secondary to liver cirrhosis. Foley catheter was anchored by urology.  Continue to follow. Will keep Foley catheter at discharge, patient will be followed with urology as outpatient.   Essential hypertension - Will continue antihypertensive therapy.   Chronic obstructive pulmonary disease (COPD) (HCC) Will continue his inhalers.        Subjective: Patient does not have any black stool or rectal bleeding.  No nausea vomiting.  Physical Exam: Vitals:   12/12/23 2021 12/12/23 2330 12/13/23 0359 12/13/23 0741  BP: (!) 147/92 (!) 138/97 138/87 (!) 132/95  Pulse: (!) 105 (!) 107 (!) 108 (!) 106  Resp: 18 18 15    Temp: 98.3 F (36.8 C) 97.6 F (36.4 C) 97.9 F (36.6 C) 98.2 F (36.8 C)  TempSrc:  Oral Oral   SpO2: 97% 96% 96%  98%  Weight:      Height:       General exam: Appears calm and comfortable  Respiratory system: Clear to auscultation. Respiratory effort normal. Cardiovascular system: S1 & S2 heard, RRR. No JVD, murmurs, rubs, gallops or clicks.  Gastrointestinal system: Abdomen  is distended, soft and nontender. No organomegaly or masses felt. Normal bowel sounds heard. Central nervous system: Alert and oriented. No focal neurological deficits. Extremities: 3+ leg edema Skin: No rashes, lesions or ulcers Psychiatry: Judgement and insight appear normal. Mood & affect appropriate.    Data Reviewed:  Lab results reviewed.  Family Communication: None  Disposition: Status is: Inpatient Remains inpatient appropriate because: Severity of disease, IV treatment.     Time spent: 35 minutes  Author: Donaciano Frizzle, MD 12/13/2023 10:45 AM  For on call review [email or web address].

## 2023-12-13 NOTE — Transfer of Care (Signed)
 Immediate Anesthesia Transfer of Care Note  Patient: Joel Harrell  Procedure(s) Performed: COLONOSCOPY POLYPECTOMY, INTESTINE CONTROL OF HEMORRHAGE, GI TRACT, ENDOSCOPIC, BY CLIPPING OR OVERSEWING  Patient Location: PACU  Anesthesia Type:MAC  Level of Consciousness: awake  Airway & Oxygen Therapy: Patient Spontanous Breathing  Post-op Assessment: Report given to RN and Post -op Vital signs reviewed and stable  Post vital signs: stable  Last Vitals:  Vitals Value Taken Time  BP 146/101 12/13/23 1139  Temp 36.2 C 12/13/23 1139  Pulse 102 12/13/23 1139  Resp 23 12/13/23 1139  SpO2 99 % 12/13/23 1139    Last Pain:  Vitals:   12/13/23 1139  TempSrc: Temporal  PainSc: Asleep         Complications: No notable events documented.

## 2023-12-13 NOTE — Progress Notes (Signed)
 PT Cancellation Note  Patient Details Name: Joel Harrell MRN: [id number] DOB: 09-16-1955   Cancelled Treatment:    Reason Eval/Treat Not Completed: Fatigue/lethargy limiting ability to participate  Pt declined session.  General fatigue from colonoscopy prep and procedure today.   Charlanne Cong 12/13/2023, 2:18 PM

## 2023-12-13 NOTE — Procedures (Signed)
  Procedure:  Mesenteric arteriogram (IMA and SMA) no bleed or other focal lesion Preprocedure diagnosis: The primary encounter diagnosis was Anemia, unspecified type. 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 Postprocedure diagnosis: same EBL:    minimal Complications:   none immediate  See full dictation in [employer].  Nicky Barrack MD Main # [phone] Pager  [phone] Mobile [phone]

## 2023-12-13 NOTE — Op Note (Signed)
 [facility] Patient Name: Joel Harrell Procedure Date: 12/13/2023 10:38 AM MRN: [id number] Account #: [id number] Date of Birth: 12-Jun-1956 Admit Type: Inpatient Age: 68 Room: [facility] ENDO ROOM 4 Gender: Male Note Status: Finalized Instrument Name: Charlyn Cooley [id number] Procedure:             Colonoscopy Indications:           Iron deficiency anemia Providers:             Marnee Sink MD, MD Referring MD:          No Local Md, MD (Referring MD) Medicines:             Propofol per Anesthesia Complications:         No immediate complications. Procedure:             Pre-Anesthesia Assessment:                        - Prior to the procedure, a History and Physical was                         performed, and patient medications and allergies were                         reviewed. The patient's tolerance of previous                         anesthesia was also reviewed. The risks and benefits                         of the procedure and the sedation options and risks                         were discussed with the patient. All questions were                         answered, and informed consent was obtained. Prior                         Anticoagulants: The patient has taken no anticoagulant                         or antiplatelet agents. ASA Grade Assessment: II - A                         patient with mild systemic disease. After reviewing                         the risks and benefits, the patient was deemed in                         satisfactory condition to undergo the procedure.                        After obtaining informed consent, the colonoscope was                         passed under direct vision. Throughout the procedure,  the patient's blood pressure, pulse, and oxygen                         saturations were monitored continuously. The                         Colonoscope was introduced through the anus and                          advanced to the the cecum, identified by appendiceal                         orifice and ileocecal valve. The colonoscopy was                         performed without difficulty. The patient tolerated                         the procedure well. The quality of the bowel                         preparation was good. Findings:      The perianal and digital rectal examinations were normal.      A 4 mm polyp was found in the ascending colon. The polyp was sessile.       The polyp was removed with a cold snare. Resection and retrieval were       complete. For hemostasis, one hemostatic clip was successfully placed.       Clip manufacturer: [employer]. There was no bleeding at the end       of the procedure.      An 8 mm polyp was found in the transverse colon. The polyp was sessile.       The polyp was removed with a cold snare. Resection and retrieval were       complete. For hemostasis, two hemostatic clips were successfully placed.       Clip manufacturer: [employer]. There was no bleeding at the end       of the procedure.      A non-obstructing medium-sized mass was found in the sigmoid colon. The       mass was non-circumferential. Oozing was present. Biopsies were taken       with a cold forceps for histology. Area was tattooed with an injection       of 3 mL of Spot (carbon black). Impression:            - One 4 mm polyp in the ascending colon, removed with                         a cold snare. Resected and retrieved. Clip was placed.                         Clip manufacturer: [employer].                        - One 8 mm polyp in the transverse colon, removed with                         a cold snare. Resected and retrieved.  Clips were                         placed. Clip manufacturer: [employer].                        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 Biopsied. Tattooed. Recommendation:        - Return patient to hospital ward for ongoing care.                         - Resume previous diet.                        - Continue present medications.                        - Await pathology results. Procedure Code(s):     --- Professional ---                        [phone], Colonoscopy, flexible; with removal of                         tumor(s), polyp(s), or other lesion(s) by snare                         technique                        45380, 59, Colonoscopy, flexible; with biopsy, single                         or multiple Diagnosis Code(s):     --- Professional ---                        D50.9, Iron deficiency anemia, unspecified                        D49.0, Neoplasm of unspecified behavior of digestive                         system CPT copyright 2022 American [employer] Association. All rights reserved. The codes documented in this report are preliminary and upon coder review may  be revised to meet current compliance requirements. Marnee Sink MD, MD 12/13/2023 11:33:37 AM This report has been signed electronically. Number of Addenda: 0 Note Initiated On: 12/13/2023 10:38 AM Scope Withdrawal Time: 0 hours 14 minutes 7 seconds  Total Procedure Duration: 0 hours 28 minutes 55 seconds  Estimated Blood Loss:  Estimated blood loss: none.      [facility]

## 2023-12-14 ENCOUNTER — Institutional Professional Consult (permissible substitution): Admitting: Radiation Oncology

## 2023-12-14 ENCOUNTER — Ambulatory Visit

## 2023-12-14 ENCOUNTER — Inpatient Hospital Stay

## 2023-12-14 DIAGNOSIS — Z510 Encounter for antineoplastic radiation therapy: Secondary | ICD-10-CM | POA: Insufficient documentation

## 2023-12-14 DIAGNOSIS — C189 Malignant neoplasm of colon, unspecified: Secondary | ICD-10-CM | POA: Insufficient documentation

## 2023-12-14 DIAGNOSIS — D62 Acute posthemorrhagic anemia: Secondary | ICD-10-CM | POA: Diagnosis not present

## 2023-12-14 DIAGNOSIS — Z7189 Other specified counseling: Secondary | ICD-10-CM

## 2023-12-14 DIAGNOSIS — K2921 Alcoholic gastritis with bleeding: Secondary | ICD-10-CM | POA: Diagnosis not present

## 2023-12-14 DIAGNOSIS — C187 Malignant neoplasm of sigmoid colon: Secondary | ICD-10-CM | POA: Diagnosis not present

## 2023-12-14 DIAGNOSIS — K7031 Alcoholic cirrhosis of liver with ascites: Secondary | ICD-10-CM | POA: Diagnosis not present

## 2023-12-14 DIAGNOSIS — Z515 Encounter for palliative care: Secondary | ICD-10-CM

## 2023-12-14 LAB — SURGICAL PATHOLOGY

## 2023-12-14 LAB — CBC
HCT: 24.6 % — ABNORMAL LOW (ref 39.0–52.0)
Hemoglobin: 7.3 g/dL — ABNORMAL LOW (ref 13.0–17.0)
MCH: 22.3 pg — ABNORMAL LOW (ref 26.0–34.0)
MCHC: 29.7 g/dL — ABNORMAL LOW (ref 30.0–36.0)
MCV: 75.2 fL — ABNORMAL LOW (ref 80.0–100.0)
Platelets: 140 10*3/uL — ABNORMAL LOW (ref 150–400)
RBC: 3.27 MIL/uL — ABNORMAL LOW (ref 4.22–5.81)
RDW: 28.1 % — ABNORMAL HIGH (ref 11.5–15.5)
WBC: 5 10*3/uL (ref 4.0–10.5)
nRBC: 0 % (ref 0.0–0.2)

## 2023-12-14 LAB — COMPREHENSIVE METABOLIC PANEL WITH GFR
ALT: 10 U/L (ref 0–44)
AST: 22 U/L (ref 15–41)
Albumin: 2.2 g/dL — ABNORMAL LOW (ref 3.5–5.0)
Alkaline Phosphatase: 54 U/L (ref 38–126)
Anion gap: 9 (ref 5–15)
BUN: 9 mg/dL (ref 8–23)
CO2: 22 mmol/L (ref 22–32)
Calcium: 7.5 mg/dL — ABNORMAL LOW (ref 8.9–10.3)
Chloride: 106 mmol/L (ref 98–111)
Creatinine, Ser: 0.79 mg/dL (ref 0.61–1.24)
GFR, Estimated: >60 mL/min (ref >60)
Glucose, Bld: 100 mg/dL — ABNORMAL HIGH (ref 70–99)
Potassium: 3.6 mmol/L (ref 3.5–5.1)
Sodium: 137 mmol/L (ref 135–145)
Total Bilirubin: 3.1 mg/dL — ABNORMAL HIGH (ref 0.0–1.2)
Total Protein: 6.4 g/dL — ABNORMAL LOW (ref 6.5–8.1)

## 2023-12-14 LAB — ALBUMIN, PLEURAL OR PERITONEAL FLUID: Albumin, Fluid: <1.5 g/dL

## 2023-12-14 LAB — BODY FLUID CELL COUNT WITH DIFFERENTIAL
Eos, Fluid: 0 %
Lymphs, Fluid: 19 %
Monocyte-Macrophage-Serous Fluid: 68 %
Neutrophil Count, Fluid: 13 %
Total Nucleated Cell Count, Fluid: 207 uL

## 2023-12-14 LAB — HCV AB W REFLEX TO QUANT PCR: HCV Ab: REACTIVE — AB

## 2023-12-14 LAB — PROTEIN, PLEURAL OR PERITONEAL FLUID: Total protein, fluid: <3 g/dL

## 2023-12-14 LAB — PHOSPHORUS: Phosphorus: 3.1 mg/dL (ref 2.5–4.6)

## 2023-12-14 LAB — HCV RT-PCR, QUANT (NON-GRAPH)
HCV log10: 4.866 {Log_IU}/mL
Hepatitis C Quantitation: 73500 [IU]/mL

## 2023-12-14 LAB — HEMOGLOBIN
Hemoglobin: 8.3 g/dL — ABNORMAL LOW (ref 13.0–17.0)
Hemoglobin: 8.8 g/dL — ABNORMAL LOW (ref 13.0–17.0)
Hemoglobin: 7.8 g/dL — ABNORMAL LOW (ref 13.0–17.0)

## 2023-12-14 LAB — MAGNESIUM: Magnesium: 1.8 mg/dL (ref 1.7–2.4)

## 2023-12-14 MED ORDER — LIDOCAINE HCL (PF) 1 % IJ SOLN
7.0000 mL | Freq: Once | INTRAMUSCULAR | Status: AC
  Administered 2023-12-14: 7 mL via INTRADERMAL
  Filled 2023-12-14: qty 8

## 2023-12-14 NOTE — Consult Note (Addendum)
 Hematology/Oncology Consult note [facility] Telephone:([phone] [phone]  Patient Care Team: Patient, No Pcp Per as PCP - General (General Practice)   Name of the patient: Joel Harrell  [id number]  [birth year]    Reason for consult: New diagnosis of colon cancer   Requesting physician: Dr. Jeane Miguel  Date of visit: 12/14/2023    History of presenting illness-patient is a 68 year old male with a past medical history significant for hypertension, COPD, liver cirrhosisWith ascites.  He has not seen GI for his cirrhosis.  He presented to the hospital with symptoms of worsening abdominal distention and scrotal edema.  He drinks about 12 packs of beer per week.  On arrival to the hospital patient was found to have melena and a hemoglobin of 4.2.  He was started on Protonix  and octreotide as well as given PRBC transfusion.  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  Stomach was otherwise normal-appearing.  Grade 1 esophageal varices.  He subsequently underwent colonoscopy on 12/13/2023 which showed nonobstructing medium-sized mass in the sigmoid colon where oozing was present and this was biopsied and consistent with colon adenocarcinoma.  CT angiogram was done to locate the site of bleeding but target vessel could not be identified and therefore IR could not embolize it.  Attempt was made to transfer the patient for surgical consultation at [facility] but after reviewing his case [facility] declined transfer and was not deemed to be a surgical candidate due to his advanced cirrhosis.  Oncology consulted for sigmoid adenocarcinoma.  ECOG PS- 3  Pain scale- 4   Review of systems- Review of Systems  Constitutional:  Positive for malaise/fatigue. Negative for chills, fever and weight loss.  HENT:  Negative for congestion, ear discharge and nosebleeds.   Eyes:  Negative for blurred vision.  Respiratory:  Negative for cough,  hemoptysis, sputum production, shortness of breath and wheezing.   Cardiovascular:  Negative for chest pain, palpitations, orthopnea and claudication.  Gastrointestinal:  Positive for abdominal pain. Negative for blood in stool, constipation, diarrhea, heartburn, melena, nausea and vomiting.  Genitourinary:  Negative for dysuria, flank pain, frequency, hematuria and urgency.  Musculoskeletal:  Negative for back pain, joint pain and myalgias.  Skin:  Negative for rash.  Neurological:  Negative for dizziness, tingling, focal weakness, seizures, weakness and headaches.  Endo/Heme/Allergies:  Does not bruise/bleed easily.  Psychiatric/Behavioral:  Negative for depression and suicidal ideas. The patient does not have insomnia.     No Known Allergies  Patient Active Problem List   Diagnosis Date Noted   Colon cancer (HCC) 12/14/2023   Overweight (BMI 25.0-29.9) 12/13/2023   Neoplasm of digestive system 12/13/2023   Mass of colon 12/13/2023   Hypokalemia 12/12/2023   Hyponatremia 12/12/2023   Hypomagnesemia 12/12/2023   Hypophosphatemia 12/12/2023   Portal hypertension (HCC) 12/12/2023   Ascites due to alcoholic cirrhosis (HCC) 12/11/2023   Alcohol abuse 12/11/2023   Chronic obstructive pulmonary disease (COPD) (HCC) 12/11/2023   Essential hypertension 12/11/2023   Pancytopenia (HCC) 12/11/2023   Acute blood loss anemia 12/11/2023   Iron deficiency anemia 12/11/2023   GI bleeding 12/10/2023   Acute hepatic encephalopathy (HCC) 11/22/2022   Iron deficiency anemia due to chronic blood loss 11/22/2022   Decompensated hepatic cirrhosis (HCC) 11/22/2022   Alcohol use disorder 11/22/2022   Hypertension 11/22/2022   COPD (chronic obstructive pulmonary disease) (HCC) 11/22/2022     Past Medical History:  Diagnosis Date   Asthma  CHF (congestive heart failure) (HCC)    Hypertension      Past Surgical History:  Procedure Laterality Date   COLONOSCOPY N/A 12/13/2023   Procedure:  COLONOSCOPY;  Surgeon: Marnee Sink, MD;  Location: [facility] ENDOSCOPY;  Service: Endoscopy;  Laterality: N/A;   ESOPHAGOGASTRODUODENOSCOPY N/A 12/12/2023   Procedure: EGD (ESOPHAGOGASTRODUODENOSCOPY);  Surgeon: Quintin Buckle, DO;  Location: [facility] ENDOSCOPY;  Service: Gastroenterology;  Laterality: N/A;   HEMOSTASIS CLIP PLACEMENT  12/13/2023   Procedure: CONTROL OF HEMORRHAGE, GI TRACT, ENDOSCOPIC, BY CLIPPING OR OVERSEWING;  Surgeon: Marnee Sink, MD;  Location: [facility] ENDOSCOPY;  Service: Endoscopy;;   IR ANGIOGRAM PELVIS SELECTIVE OR SUPRASELECTIVE  12/13/2023   POLYPECTOMY  12/13/2023   Procedure: POLYPECTOMY, INTESTINE;  Surgeon: Marnee Sink, MD;  Location: [facility] ENDOSCOPY;  Service: Endoscopy;;    Social History   Socioeconomic History   Marital status: Single    Spouse name: Not on file   Number of children: Not on file   Years of education: Not on file   Highest education level: Not on file  Occupational History   Not on file  Tobacco Use   Smoking status: Never   Smokeless tobacco: Never  Vaping Use   Vaping status: Never Used  Substance and Sexual Activity   Alcohol use: Yes    Alcohol/week: 10.0 standard drinks of alcohol    Types: 10 Cans of beer per week   Drug use: No   Sexual activity: Not on file  Other Topics Concern   Not on file  Social History Narrative   Not on file   Social Drivers of Health   Financial Resource Strain: Not on file  Food Insecurity: No Food Insecurity (12/11/2023)   Hunger Vital Sign    Worried About Running Out of Food in the Last Year: Never true    Ran Out of Food in the Last Year: Never true  Transportation Needs: No Transportation Needs (12/11/2023)   PRAPARE - Administrator, Civil Service (Medical): No    Lack of Transportation (Non-Medical): No  Physical Activity: Not on file  Stress: Not on file  Social Connections: Moderately Integrated (12/11/2023)   Social Connection and Isolation Panel [NHANES]    Frequency of  Communication with Friends and Family: More than three times a week    Frequency of Social Gatherings with Friends and Family: Three times a week    Attends Religious Services: More than 4 times per year    Active Member of Clubs or Organizations: No    Attends Banker Meetings: Never    Marital Status: Married  Catering manager Violence: Not At Risk (12/11/2023)   Humiliation, Afraid, Rape, and Kick questionnaire    Fear of Current or Ex-Partner: No    Emotionally Abused: No    Physically Abused: No    Sexually Abused: No     Family History  Problem Relation Age of Onset   Heart attack Father    Cancer Mother      Current Facility-Administered Medications:    acetaminophen  (TYLENOL ) tablet 650 mg, 650 mg, Oral, Q6H PRN, 650 mg at 12/12/23 1633 **OR** acetaminophen  (TYLENOL ) suppository 650 mg, 650 mg, Rectal, Q6H PRN, Mansy, Jan A, MD   albumin human 25 % solution 25 g, 25 g, Intravenous, Once, Donaciano Frizzle, MD   albuterol  (PROVENTIL ) (2.5 MG/3ML) 0.083% nebulizer solution 3 mL, 3 mL, Nebulization, Q4H PRN, Zhang, Dekui, MD, 3 mL at 12/14/23 0416   amLODipine  (NORVASC ) tablet 5 mg,  5 mg, Oral, Daily, Mansy, Jan A, MD, 5 mg at 12/14/23 8756   bisacodyl (DULCOLAX) EC tablet 10 mg, 10 mg, Oral, BID PRN, Mansy, Jan A, MD, 10 mg at 12/13/23 0300   Chlorhexidine Gluconate Cloth 2 % PADS 6 each, 6 each, Topical, Daily, Zhang, Dekui, MD, 6 each at 12/13/23 1446   folic acid  (FOLVITE ) tablet 1 mg, 1 mg, Oral, Daily, Mansy, Jan A, MD, 1 mg at 12/14/23 0831   lisinopril  (ZESTRIL ) tablet 20 mg, 20 mg, Oral, Daily, Mansy, Jan A, MD, 20 mg at 12/14/23 0831   magnesium  hydroxide (MILK OF MAGNESIA) suspension 30 mL, 30 mL, Oral, Daily PRN, Mansy, Jan A, MD   montelukast  (SINGULAIR ) tablet 10 mg, 10 mg, Oral, QHS, Mansy, Jan A, MD, 10 mg at 12/14/23 0112   ondansetron  (ZOFRAN ) tablet 4 mg, 4 mg, Oral, Q6H PRN **OR** ondansetron  (ZOFRAN ) injection 4 mg, 4 mg, Intravenous, Q6H PRN, Mansy,  Jan A, MD   polyethylene glycol-electrolytes (NuLYTELY ) solution 2,000 mL, 2,000 mL, Oral, Once PRN, Russo, Steven Michael, DO   sodium phosphate  (FLEET) enema 1 enema, 1 enema, Rectal, Daily PRN, Mansy, Jan A, MD, 1 enema at 12/13/23 0300   thiamine  (VITAMIN B1) tablet 100 mg, 100 mg, Oral, Daily, Mansy, Jan A, MD, 100 mg at 12/14/23 4332   traZODone (DESYREL) tablet 25 mg, 25 mg, Oral, QHS PRN, Mansy, Jan A, MD   Physical exam:  Vitals:   12/14/23 1002 12/14/23 1025 12/14/23 1120 12/14/23 1543  BP: 110/71 121/80 123/73 119/71  Pulse: 94 92 89 100  Resp:  19  17  Temp: 97.9 F (36.6 C)   98 F (36.7 C)  TempSrc: Oral Oral    SpO2: 98% 97% 97% 100%  Weight:      Height:       Physical Exam Cardiovascular:     Rate and Rhythm: Normal rate and regular rhythm.     Heart sounds: Normal heart sounds.  Pulmonary:     Effort: Pulmonary effort is normal.     Breath sounds: Normal breath sounds.  Abdominal:     Comments: Distended ascites positive  Musculoskeletal:     Right lower leg: Edema present.     Left lower leg: Edema present.  Skin:    General: Skin is warm and dry.  Neurological:     Mental Status: He is alert and oriented to person, place, and time.           Latest Ref Rng & Units 12/14/2023    5:29 AM  CMP  Glucose 70 - 99 mg/dL 951   BUN 8 - 23 mg/dL 9   Creatinine 8.84 - 1.66 mg/dL 0.63   Sodium 016 - 010 mmol/L 137   Potassium 3.5 - 5.1 mmol/L 3.6   Chloride 98 - 111 mmol/L 106   CO2 22 - 32 mmol/L 22   Calcium 8.9 - 10.3 mg/dL 7.5   Total Protein 6.5 - 8.1 g/dL 6.4   Total Bilirubin 0.0 - 1.2 mg/dL 3.1   Alkaline Phos 38 - 126 U/L 54   AST 15 - 41 U/L 22   ALT 0 - 44 U/L 10       Latest Ref Rng & Units 12/14/2023   12:48 PM  CBC  Hemoglobin 13.0 - 17.0 g/dL 8.3     @IMAGES @  US  Paracentesis Result Date: 12/14/2023 INDICATION: 68 year old male with a history of alcoholic liver cirrhosis who presented to the ED with lower extremity edema  and  abdominal distention. Request for diagnostic and therapeutic paracentesis. EXAM: ULTRASOUND GUIDED left PARACENTESIS MEDICATIONS: 1% lidocaine , 7 mL. COMPLICATIONS: None immediate. PROCEDURE: Informed written consent was obtained from the patient after a discussion of the risks, benefits and alternatives to treatment. A timeout was performed prior to the initiation of the procedure. Initial ultrasound scanning demonstrates a large amount of ascites within the left lower abdominal quadrant. The left lower abdomen was prepped and draped in the usual sterile fashion. 1% lidocaine  was used for local anesthesia. Following this, a 19 gauge, 7-cm, Yueh catheter was introduced. An ultrasound image was saved for documentation purposes. The paracentesis was performed. The catheter was removed and a dressing was applied. The patient tolerated the procedure well without immediate post procedural complication. Patient received post-procedure intravenous albumin; see nursing notes for details. FINDINGS: A total of approximately 3 L of amber fluid was removed. Samples were sent to the laboratory as requested by the clinical team. IMPRESSION: Successful ultrasound-guided paracentesis yielding 3 liters of peritoneal fluid. PLAN: If the patient eventually requires >/=2 paracenteses in a 30 day period, candidacy for formal evaluation by the [facility] will be assessed. Procedure performed by: Estella Helling, PA-C under the supervision of Dr. Irine Manning Electronically Signed   By: Elene Griffes M.D.   On: 12/14/2023 12:53   IR Angiogram Pelvis Selective Or Supraselective Result Date: 12/14/2023 CLINICAL DATA:  Active GI bleed in the distal transverse colon near an endoscopic clip on recent CTA EXAM: MESENTERIC SELECTIVE ARTERIOGRAPHY ANESTHESIA/SEDATION: Intravenous Fentanyl 25mcg administered by RN during a total moderate (conscious) sedation time of 48 minutes; the patient's level of  consciousness and physiological / cardiorespiratory status were monitored continuously by radiology RN under my direct supervision. MEDICATIONS: Lidocaine  1% subcutaneous CONTRAST:  85mL OMNIPAQUE IOHEXOL 300 MG/ML  SOLN PROCEDURE: The procedure, risks (including but not limited to bleeding, infection, organ damage ), benefits, and alternatives were explained to the patient and spouse. Questions regarding the procedure were encouraged and answered. The patient understands and consents to the procedure. Right femoral region prepped and draped in usual sterile fashion. Maximal barrier sterile technique was utilized including caps, mask, sterile gowns, sterile gloves, sterile drape, hand hygiene and skin antiseptic. The right common femoral artery was localized under ultrasound. Under real-time ultrasound guidance, the vessel was accessed with a 21-gauge micropuncture needle, exchanged over a 018 guidewire for a transitional dilator, through which a 035 guidewire was advanced. Over this, a 5 [place] vascular sheath was placed, through which a 5 French rim catheter was advanced and used to selectively catheterize the inferior mesenteric artery for selective arteriography in multiple projections. The catheter was then exchanged over the guidewire for a C2 catheter, then utilized to selectively catheterize the superior mesenteric artery for selective arteriography in multiple projections. The catheter and sheath were removed and hemostasis achieved with the aid of the Celt device under ultrasound guidance. The patient tolerated the procedure well. COMPLICATIONS: None immediate FINDINGS: No evidence of active extravasation, early draining vein, AVM, or other lesion to suggest a site or etiology of the patient's GI bleeding. Endoscopic clip in the distal transverse colon, corresponding to findings from recent CTA. Venous phase confirms patency of the IMV and portal venous system. IMPRESSION: 1. Negative 2-vessel mesenteric  arteriogram. No evidence of active extravasation or other focal lesion to suggest etiology of GI bleed. Electronically Signed   By: Nicoletta Barrier M.D.   On: 12/14/2023 02:08   CT ANGIO GI BLEED  Result Date: 12/13/2023 CLINICAL DATA:  Lower GI bleeding * Tracking Code: BO * EXAM: CTA ABDOMEN AND PELVIS WITHOUT AND WITH CONTRAST TECHNIQUE: Multidetector CT imaging of the abdomen and pelvis was performed using the standard protocol during bolus administration of intravenous contrast. Multiplanar reconstructed images and MIPs were obtained and reviewed to evaluate the vascular anatomy. RADIATION DOSE REDUCTION: This exam was performed according to the departmental dose-optimization program which includes automated exposure control, adjustment of the mA and/or kV according to patient size and/or use of iterative reconstruction technique. CONTRAST:  100mL OMNIPAQUE IOHEXOL 350 MG/ML SOLN COMPARISON:  None Available. FINDINGS: VASCULAR Normal contour and caliber of the abdominal aorta. No evidence of aneurysm, dissection, or other acute aortic pathology. 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 Mild aortic atherosclerosis. Review of the MIP images confirms the above findings. NON-VASCULAR Lower Chest: Moderate right, small left pleural effusions and associated atelectasis or consolidation. Three-vessel coronary artery calcifications. Hepatobiliary: No solid liver abnormality is seen. Coarse contour of the liver. Gallstones. No biliary ductal dilatation. Pancreas: Unremarkable. No pancreatic ductal dilatation or surrounding inflammatory changes. Spleen: Splenomegaly, maximum span 15.5 cm. Adrenals/Urinary Tract: Adrenal glands are unremarkable. Kidneys are normal, without renal calculi, solid lesion, or hydronephrosis. Foley catheter in the bladder. Stomach/Bowel: Small gastroesophageal varices. The colon is filled with fluid and  heterogeneously hyperdense material to the rectum. Endoluminal clip within the redundant distal transverse colon with arterial extravasation arising in this vicinity (series 18, image 107). Contrast enhancing endoluminal mass of the very redundant mid sigmoid colon measuring 2.4 x 2.1 cm (series 18, image 126) Lymphatic: No enlarged abdominal or pelvic lymph nodes. Reproductive: No mass or other significant abnormality. Other: No abdominal wall hernia. Severe anasarca. Moderate volume ascites throughout the abdomen pelvis. Musculoskeletal: No acute osseous findings. IMPRESSION: 1. Endoluminal clip within the redundant distal transverse colon with arterial extravasation arising in this vicinity, consistent with active GI bleeding. 2. Contrast enhancing endoluminal mass of the very redundant mid sigmoid colon measuring 2.4 x 2.1 cm, highly concerning for primary colon malignancy. 3. The colon is filled with fluid and heterogeneously hyperdense material to the rectum, consistent with blood products. 4. Normal contour and caliber of the abdominal aorta. No evidence of aneurysm, dissection, or other acute aortic pathology. Mild aortic atherosclerosis. 5. Cirrhosis and splenomegaly.  Small gastroesophageal varices. 6. Pleural effusions, severe anasarca, and ascites. 7. Cholelithiasis.  Coronary artery disease. These results will be called to the ordering clinician or representative by the Radiologist Assistant, and communication documented in the PACS or [employer]. Aortic Atherosclerosis (ICD10-I70.0). Electronically Signed   By: Fredricka Jenny M.D.   On: 12/13/2023 20:50   US  Abdomen Limited RUQ (LIVER/GB) Result Date: 12/12/2023 CLINICAL DATA:  [id number] Ascites due to alcoholic cirrhosis (HCC) [id number] EXAM: ULTRASOUND ABDOMEN LIMITED RIGHT UPPER QUADRANT COMPARISON:  Nov 22, 2022 FINDINGS: Gallbladder: Multiple small gallstones. No wall thickening or pericholecystic fluid. No sonographic Murphy's sign noted by  sonographer. Common bile duct: Diameter: 3 mm Liver: Nodular contour with coarsened echogenicity. No focal lesion identified. No intrahepatic biliary ductal dilation. Portal vein is patent on color Doppler imaging with normal direction of blood flow towards the liver. Other: Moderate volume ascites.  Splenomegaly. IMPRESSION: 1. Cholecystolithiasis.  No changes of acute cholecystitis. 2. Cirrhotic morphology of the liver with sequelae of portal hypertension, including splenomegaly and moderate volume ascites. Continued biannual HCC surveillance recommended. Electronically Signed   By: Rance Burrows  M.D.   On: 12/12/2023 16:05   DG Chest Portable 1 View Result Date: 12/10/2023 CLINICAL DATA:  Chest pain EXAM: PORTABLE CHEST 1 VIEW COMPARISON:  None Available. FINDINGS: Mild right basilar atelectasis. Lungs are otherwise clear. No pneumothorax or pleural effusion. Cardiac size within normal limits. Pulmonary vascularity is normal. Osseous structures are age-appropriate. No acute bone abnormality. IMPRESSION: No active disease. Electronically Signed   By: Worthy Heads M.D.   On: 12/10/2023 22:20    Assessment and plan- Patient is a 68 y.o. male with history of advanced cirrhosis presenting with lower GI bleed from newly diagnosed sigmoid adenocarcinoma  Sigmoid adenocarcinoma: : Recommend getting a CT chest without contrast to complete staging workup.  I will add CEA to his labs tomorrow.  CT angio done for GI bleed did not show any evidence of local or regional adenopathy.  Ideally for localized sigmoid carcinoma patient needs definitive surgical resection but given his advanced liver disease and cirrhosis patient has not been deemed to be a candidate for surgery.  [facility] declined his transfer.  His hemoglobin has stabilized and has remained 7 for the last 2 days.  However there is a risk of rebleeding from his ongoing tumor.  I have therefore asked radiation oncology to see him to offer palliative radiation  for his sigmoid mass.  Palliative 5-FU based chemotherapy can be considered but I will have to take into account his performance status and liver functions upon discharge.  At present patient is not interested in considering chemotherapy.  Given his advanced liver disease as well as newly diagnosed colon cancer, hospice is a consideration I will have him see palliative care as well for goals of care conversation  Anemia: Palliative measures as above and we will consider IV iron as an outpatient   Thank you for this kind referral and the opportunity to participate in the care of this patient   Visit Diagnosis 1. Anemia, unspecified type   2. Ascites due to alcoholic cirrhosis (HCC)   3. Gastrointestinal hemorrhage associated with alcoholic gastritis   4. Hypomagnesemia     Dr. Seretha Dance, MD, MPH [facility] at [facility] [phone] 12/14/2023

## 2023-12-14 NOTE — Progress Notes (Signed)
 Physical Therapy Treatment Patient Details Name: Joel Harrell MRN: [id number] DOB: 1956-01-04 Today's Date: 12/14/2023   History of Present Illness Pt is a 68 yo male admitted for potential GI bleed, LE edema. PMH of asthma, CHF, HTN, etoh.    PT Comments  Pt is making good progress towards goals with increased mobility noted this date. Pt able to ambulate around RN station with RW. Cues for safety/sequencing due to wide BOS. Pt feels encouraged as edema is decreasing. Noticed continued bleeding during BM. Will continue to progress as able.    If plan is discharge home, recommend the following: A little help with bathing/dressing/bathroom;Assistance with cooking/housework;A little help with walking and/or transfers;Assist for transportation;Help with stairs or ramp for entrance   Can travel by private vehicle        Equipment Recommendations  Rolling walker (2 wheels);BSC/3in1    Recommendations for Other Services       Precautions / Restrictions Precautions Precautions: Fall Recall of Precautions/Restrictions: Intact Restrictions Weight Bearing Restrictions Per Provider Order: No     Mobility  Bed Mobility               General bed mobility comments: not performed, received and ended session in chair    Transfers Overall transfer level: Needs assistance Equipment used: Rolling walker (2 wheels) Transfers: Sit to/from Stand Sit to Stand: Contact guard assist           General transfer comment: able to stand from low chair with ease. RW used    Ambulation/Gait Ambulation/Gait assistance: Contact guard assist Gait Distance (Feet): 200 Feet Assistive device: Rolling walker (2 wheels) Gait Pattern/deviations: Step-through pattern       General Gait Details: wide BOS, needed reminders for keeping feet inside of RW during turns. Reciprocal gait pattern performed   Stairs             Wheelchair Mobility     Tilt Bed    Modified Rankin (Stroke  Patients Only)       Balance Overall balance assessment: Needs assistance Sitting-balance support: Feet supported Sitting balance-[name]: Good     Standing balance support: During functional activity, Single extremity supported Standing balance-[name]: Fair                              Hotel manager: No apparent difficulties  Cognition Arousal: Alert Behavior During Therapy: WFL for tasks assessed/performed   PT - Cognitive impairments: No apparent impairments                       PT - Cognition Comments: pleasant and agreeable to session Following commands: Intact      Cueing Cueing Techniques: Verbal cues  Exercises Other Exercises Other Exercises: ambulated to bathroom with +BM. Able to perform hygiene safely including hand washing. NOted bright blood in stool. Other Exercises: Standing pushups on RW and educated on continuing seated B LE ther-ex including LAQ and alt marching.    General Comments        Pertinent Vitals/Pain Pain Assessment Pain Assessment: No/denies pain    Home Living                          Prior Function            PT Goals (current goals can now be found in the care plan section) Acute Rehab PT Goals Patient  Stated Goal: to be less swollen PT Goal Formulation: With patient Time For Goal Achievement: 12/26/23 Potential to Achieve Goals: Good Progress towards PT goals: Progressing toward goals    Frequency    Min 2X/week      PT Plan      Co-evaluation              AM-PAC PT "6 Clicks" Mobility   Outcome Measure  Help needed turning from your back to your side while in a flat bed without using bedrails?: A Little Help needed moving from lying on your back to sitting on the side of a flat bed without using bedrails?: A Little Help needed moving to and from a bed to a chair (including a wheelchair)?: A Little Help needed standing up from a chair  using your arms (e.g., wheelchair or bedside chair)?: A Little Help needed to walk in hospital room?: A Little Help needed climbing 3-5 steps with a railing? : A Little 6 Click Score: 18    End of Session   Activity Tolerance: Patient tolerated treatment well Patient left: in chair;with call bell/phone within reach;with chair alarm set Nurse Communication: Mobility status PT Visit Diagnosis: Other abnormalities of gait and mobility (R26.89);Difficulty in walking, not elsewhere classified (R26.2);Muscle weakness (generalized) (M62.81)     Time: 4098-1191 PT Time Calculation (min) (ACUTE ONLY): 22 min  Charges:    $Gait Training: 8-22 mins PT General Charges $$ ACUTE PT VISIT: 1 Visit                     Amparo Balk, PT, DPT, GCS [phone]    [name] 12/14/2023, 3:17 PM

## 2023-12-14 NOTE — Progress Notes (Signed)
 Progress Note   Patient: Joel Harrell ZOX:[id number] DOB: 1956/05/01 DOA: 12/10/2023     4 DOS: the patient was seen and examined on 12/14/2023   Brief hospital course: Kyuss Hale is a 68 y.o. male with medical history significant for asthma, CHF, hypertension, alcoholic liver cirrhosis, and COPD, who presented to the emergency room with acute onset of bilateral lower extremity edema as well as abdominal distention with scrotal edema worsening over the last week. He admitted to drinking 12 pack beer per week although his family stated that he also drinks liquor.  Upon arriving to hospital, hemoglobin was 4.2.  Patient states that he had a black stool a month ago, since then, he has been having brown stools every day. Due to liver cirrhosis, patient was given Protonix , octreotide, and PRBC.  GI consult obtained.  EGD showed portal hypertension gastropathy, esophageal varices.  No active bleeding. Colonoscopy was performed 6/2, identified a mass in the sigmoid colon, with oozing blood.  Biopsy results came back with colon cancer.  Radiation oncology consulted.   Principal Problem:   GI bleeding Active Problems:   Alcoholic cirrhosis of liver with ascites (HCC)   Alcohol abuse   COPD (chronic obstructive pulmonary disease) (HCC)   Chronic obstructive pulmonary disease (COPD) (HCC)   Essential hypertension   Pancytopenia (HCC)   Acute blood loss anemia   Iron deficiency anemia   Hypokalemia   Hyponatremia   Hypomagnesemia   Hypophosphatemia   Portal hypertension (HCC)   Overweight (BMI 25.0-29.9)   Neoplasm of digestive system   Mass of colon   Colon cancer (HCC)   Assessment and Plan: Acute lower GI bleeding secondary to colon cancer.  No upper GI bleed. Pancytopenia secondary to liver cirrhosis. Acute blood loss anemia secondary to GI bleed. Iron deficient anemia.  No B12 deficiency. Patient came in with hemoglobin of 4.3, initially received 4 units of PRBC and IV iron. Patient  had EGD performed on 6/1, showed esophageal varices and portal hypertensive gastropathy, but did not show any active bleeding.   However, colonoscopy showed sigmoid mass with oozing of blood.  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   Patient had a worsening rectal bleeding after biopsy, CT angiogram initially showed active bleeding, IR performed mesentery angiogram later, did not show any active bleeding. Patient required 2 more units of blood transfusion last night.  I had contacted [facility] yesterday, tried to transfer to surgery service.  However, transfer was declined, they do not believe patient can survive surgery.  They recommended palliative care versus hospice. I have contacted oncology today, asking for radiation oncology to perform radiation therapy, hopefully can reduce the amount of bleeding. Will continue to monitor hemoglobin and transfuse as needed.   Alcoholic cirrhosis of liver with ascites (HCC) Portal hypertension gastropathy.  Esophageal varices. Alcohol abuse. Patient has anasarca with severe leg edema.  Patient also has a large amount of ascites.  Will perform paracentesis today.   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 Hypokalemia. Hypomagnesemia. Hypophosphatemia. Condition already improved.   Urinary retention secondary to benign prostate hypertrophy and scrotal edema. Severe scrotal edema secondary to liver cirrhosis. Foley catheter was anchored by urology.  Continue to follow. Will keep Foley catheter at discharge, patient will be followed with urology as outpatient.   Essential hypertension - Will continue antihypertensive therapy.   Chronic obstructive pulmonary disease (COPD) (HCC) Will continue his inhalers.        Subjective:  Patient had a segment rectal bleeding yesterday,  which has improved today.  Denies any short of breath.  Feels hungry, wish to start a diet.  Physical Exam: Vitals:   12/14/23 0728 12/14/23 1002  12/14/23 1025 12/14/23 1120  BP: 123/75 110/71 121/80 123/73  Pulse: 100 94 92 89  Resp: 18  19   Temp: 98 F (36.7 C) 97.9 F (36.6 C)    TempSrc: Oral Oral Oral   SpO2: 96% 98% 97% 97%  Weight:      Height:       General exam: Appears calm and comfortable  Respiratory system: Clear to auscultation. Respiratory effort normal. Cardiovascular system: S1 & S2 heard, RRR. No JVD, murmurs, rubs, gallops or clicks. No pedal edema. Gastrointestinal system: Abdomen is distended, soft and nontender. No organomegaly or masses felt. Normal bowel sounds heard. Central nervous system: Alert and oriented. No focal neurological deficits. Extremities: Symmetric 5 x 5 power. Skin: No rashes, lesions or ulcers Psychiatry: Judgement and insight appear normal. Mood & affect appropriate.    Data Reviewed:  CT scan results, angiogram results and lab results reviewed.  Family Communication: Wife updated at bedside.  Disposition: Status is: Inpatient Remains inpatient appropriate because: Severity of disease, inpatient procedure.     Time spent: 55 minutes  Author: Donaciano Frizzle, MD 12/14/2023 11:58 AM  For on call review [email or web address].

## 2023-12-14 NOTE — Procedures (Signed)
 PROCEDURE SUMMARY:  Successful US  guided paracentesis from left lateral abdomen.  Yielded 3 liters of amber fluid.  No immediate complications.  Patient tolerated well.  EBL = trace  Specimen sent for labs.  Viriginia Amendola Alonzo Artis PA-C 12/14/2023 12:03 PM

## 2023-12-14 NOTE — Progress Notes (Signed)
 PT Cancellation Note  Patient Details Name: Joel Harrell MRN: [id number] DOB: [birth year]   Cancelled Treatment:    Reason Eval/Treat Not Completed: Other (comment). Pt chart reviewed, however out of room for testing. Will re-attempt.   [name] 12/14/2023, 11:10 AM Amparo Balk, PT, DPT, GCS [phone]

## 2023-12-14 NOTE — Consult Note (Signed)
 NEW PATIENT EVALUATION  Name: Joel Harrell  MRN: [id number]  Date:   12/10/2023     DOB: March 09, 1956   This 68 y.o. male patient presents to the clinic for initial evaluation of rectal bleeding from  adenocarcinoma the sigmoid colon and patient with multiple medical comorbidities.  REFERRING PHYSICIAN: No ref. provider found  CHIEF COMPLAINT:  Chief Complaint  Patient presents with   Leg Swelling    DIAGNOSIS: The primary encounter diagnosis was Anemia, unspecified type. 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   PREVIOUS INVESTIGATIONS:  CT scan reviewed Pathology reports reviewed Clinical notes reviewed  HPI: Patient is a 68 year old male with multiple medical comorbidities including CHF hypertension alcoholic liver cirrhosis COPD marked ascites who presented with to the ER with acute onset of bilateral lower extremity edema.  He also had a hemoglobin of 4.2 and has had black stools for several months.  He underwent colonoscopy by Dr. Danise Durie showing a nonobstructing medium-sized mass found in the sigmoid colon oozing was present.  Biopsies were positive for moderately differentiated invasive adenocarcinoma arising in a tubular adenoma with high-grade dysplasia.  CT angiogram showed endoluminal clip with a down the distal transverse colon with arterial extravasation along this vicinity consistent with active GI bleeding.  Contrast-enhanced endoluminal mass and very redundant mid sigmoid colon measuring 2.4 x 2.1 cm concerning for primary colon malignancy.  Also a redundant distal transverse colon with arterial extravasation arising in the vicinity.  He did have a vessel protruding from this mass on colonoscopy.  He has been turned down for surgical intervention at [facility] as well based on his cirrhosis significant ascites.  He is having his ascites tapped by radiology  today.  Patient is fairly asymptomatic from a pain threshold at this time.  He is referred to radiation oncology on emergent basis for consideration of palliative radiation therapy to attempt at curbing his GI bleed.  PLANNED TREATMENT REGIMEN: Palliative radiation therapy  PAST MEDICAL HISTORY:  has a past medical history of Asthma, CHF (congestive heart failure) (HCC), and Hypertension.    PAST SURGICAL HISTORY:  Past Surgical History:  Procedure Laterality Date   COLONOSCOPY N/A 12/13/2023   Procedure: COLONOSCOPY;  Surgeon: Marnee Sink, MD;  Location: [facility] ENDOSCOPY;  Service: Endoscopy;  Laterality: N/A;   ESOPHAGOGASTRODUODENOSCOPY N/A 12/12/2023   Procedure: EGD (ESOPHAGOGASTRODUODENOSCOPY);  Surgeon: Quintin Buckle, DO;  Location: [facility] ENDOSCOPY;  Service: Gastroenterology;  Laterality: N/A;   HEMOSTASIS CLIP PLACEMENT  12/13/2023   Procedure: CONTROL OF HEMORRHAGE, GI TRACT, ENDOSCOPIC, BY CLIPPING OR OVERSEWING;  Surgeon: Marnee Sink, MD;  Location: [facility] ENDOSCOPY;  Service: Endoscopy;;   IR ANGIOGRAM PELVIS SELECTIVE OR SUPRASELECTIVE  12/13/2023   POLYPECTOMY  12/13/2023   Procedure: POLYPECTOMY, INTESTINE;  Surgeon: Marnee Sink, MD;  Location: [facility] ENDOSCOPY;  Service: Endoscopy;;    FAMILY HISTORY: family history includes Cancer in his mother; Heart attack in his father.  SOCIAL HISTORY:  reports that he has never smoked. He has never used smokeless tobacco. He reports current alcohol use of about 10.0 standard drinks of alcohol per week. He reports that he does not use drugs.  ALLERGIES: Patient has no known allergies.  MEDICATIONS:  Current Facility-Administered Medications  Medication Dose Route Frequency Provider Last Rate Last Admin   acetaminophen  (TYLENOL ) tablet 650 mg  650 mg Oral Q6H PRN Mansy, Anastasio Kaska, MD   650 mg at 12/12/23 1633   Or  acetaminophen  (TYLENOL ) suppository 650 mg  650 mg Rectal Q6H PRN Mansy, Jan A, MD       albumin human 25 % solution 25 g  25  g Intravenous Once Donaciano Frizzle, MD       albuterol  (PROVENTIL ) (2.5 MG/3ML) 0.083% nebulizer solution 3 mL  3 mL Nebulization Q4H PRN Donaciano Frizzle, MD   3 mL at 12/14/23 0416   amLODipine  (NORVASC ) tablet 5 mg  5 mg Oral Daily Mansy, Jan A, MD   5 mg at 12/14/23 1610   bisacodyl (DULCOLAX) EC tablet 10 mg  10 mg Oral BID PRN Mansy, Jan A, MD   10 mg at 12/13/23 0300   cefTRIAXone (ROCEPHIN) 1 g in sodium chloride  0.9 % 100 mL IVPB  1 g Intravenous Q24H Donaciano Frizzle, MD 200 mL/hr at 12/14/23 0831 1 g at 12/14/23 0831   Chlorhexidine Gluconate Cloth 2 % PADS 6 each  6 each Topical Daily Donaciano Frizzle, MD   6 each at 12/13/23 1446   folic acid  (FOLVITE ) tablet 1 mg  1 mg Oral Daily Mansy, Jan A, MD   1 mg at 12/14/23 0831   lisinopril  (ZESTRIL ) tablet 20 mg  20 mg Oral Daily Mansy, Jan A, MD   20 mg at 12/14/23 0831   magnesium  hydroxide (MILK OF MAGNESIA) suspension 30 mL  30 mL Oral Daily PRN Mansy, Jan A, MD       montelukast  (SINGULAIR ) tablet 10 mg  10 mg Oral QHS Mansy, Jan A, MD   10 mg at 12/14/23 0112   ondansetron  (ZOFRAN ) tablet 4 mg  4 mg Oral Q6H PRN Mansy, Jan A, MD       Or   ondansetron  (ZOFRAN ) injection 4 mg  4 mg Intravenous Q6H PRN Mansy, Jan A, MD       pantoprazole  (PROTONIX ) injection 40 mg  40 mg Intravenous Q12H Mansy, Jan A, MD   40 mg at 12/14/23 9604   polyethylene glycol-electrolytes (NuLYTELY ) solution 2,000 mL  2,000 mL Oral Once PRN Quintin Buckle, DO       sodium phosphate  (FLEET) enema 1 enema  1 enema Rectal Daily PRN Mansy, Jan A, MD   1 enema at 12/13/23 0300   thiamine  (VITAMIN B1) tablet 100 mg  100 mg Oral Daily Mansy, Jan A, MD   100 mg at 12/14/23 5409   traZODone (DESYREL) tablet 25 mg  25 mg Oral QHS PRN Mansy, Anastasio Kaska, MD        ECOG PERFORMANCE STATUS:  2 - Symptomatic, <50% confined to bed  REVIEW OF SYSTEMS: Patient is extensive comorbidities including alcoholic cirrhosis COPD essential hypertension pancytopenia occult acute blood loss anemia  iron deficiency anemia hypokalemia hyponatremia hypomagnesemia hypophosphatemia portal hypertension and morbid obesity   PHYSICAL EXAM: BP 123/73 (BP Location: Left Arm)   Pulse 89   Temp 97.9 F (36.6 C) (Oral)   Resp 19   Ht 6\' 3"  (1.905 m)   Wt 233 lb 8 oz (105.9 kg)   SpO2 97%   BMI 29.19 kg/m  Patient has markedly distended abdomen consistent with ascites.  Well-developed well-nourished patient in NAD. HEENT reveals PERLA, EOMI, discs not visualized.  Oral cavity is clear. No oral mucosal lesions are identified. Neck is clear without evidence of cervical or supraclavicular adenopathy. Lungs are clear to A&P. Cardiac examination is essentially unremarkable with regular rate and rhythm without murmur rub or thrill. Abdomen is benign with no organomegaly or masses noted. Motor sensory and  DTR levels are equal and symmetric in the upper and lower extremities. Cranial nerves II through XII are grossly intact. Proprioception is intact. No peripheral adenopathy or edema is identified. No motor or sensory levels are noted. Crude visual fields are within normal range.  LABORATORY DATA: Pathology reports reviewed    RADIOLOGY RESULTS: CT scan reviewed compatible with above-stated findings   IMPRESSION: Sigmoid colon adenocarcinoma causing GI bleed with profound anemia in 68 year old male with multiple medical comorbidities precluding surgical intervention  PLAN: This time elective head with palliative radiation therapy nonemergent basis.  Would plan on delivering 30 Gray in 10 fractions.  We will simulate him today.  Risks and benefits of treatment including possible diarrhea which she is already experiencing fatigue skin reaction all reviewed in detail with the patient.  Will have him started on treatment tomorrow.  Patient comprehends my recommendations well.  I would like to take this opportunity to thank you for allowing me to participate in the care of your patient.Glenis Langdon,  MD

## 2023-12-14 NOTE — Progress Notes (Signed)
 Occupational Therapy Treatment Patient Details Name: Joel Harrell MRN: [id number] DOB: 03-13-1956 Today's Date: 12/14/2023   History of present illness Pt is a 68 yo male admitted for potential GI bleed, LE edema. PMH of asthma, CHF, HTN, etoh.   OT comments  Pt is supine in bed on arrival. Pleasant and agreeable to OT session. He denies pain. Pt performed bed mobility with MOD I, requiring HOB elevated and use of rails with increased time to complete. Pt required CGA for STS from EOB to RW from elevated bed height and required max A for LB bathing of peri-care where he was bleeding from. Pt able to maintain balance at RW without issue and performed a few steps to the recliner with CGA. Donned gown with supervision. He was set up to finish eating his lunch prior to working with PT.  Pt left with all needs in place and will cont to require skilled acute OT services to maximize his safety and IND to return to PLOF.       If plan is discharge home, recommend the following:  A little help with walking and/or transfers;A little help with bathing/dressing/bathroom;Help with stairs or ramp for entrance;Assistance with cooking/housework   Equipment Recommendations  Other (comment) (RW)    Recommendations for Other Services      Precautions / Restrictions Precautions Precautions: Fall Recall of Precautions/Restrictions: Intact Restrictions Weight Bearing Restrictions Per Provider Order: No       Mobility Bed Mobility Overal bed mobility: Modified Independent             General bed mobility comments: increased time, effort with HOB elevated from semi supine; no physical assist provided    Transfers Overall transfer level: Needs assistance Equipment used: Rolling walker (2 wheels) Transfers: Sit to/from Stand, Bed to chair/wheelchair/BSC Sit to Stand: Contact guard assist     Step pivot transfers: Contact guard assist     General transfer comment: from elevated bed height  without difficulty, RW use to get to recliner     Balance Overall balance assessment: Needs assistance Sitting-balance support: Feet supported Sitting balance-[name]: Good     Standing balance support: Reliant on assistive device for balance, Bilateral upper extremity supported Standing balance-[name]: Fair Standing balance comment: CGA with RW use                           ADL either performed or assessed with clinical judgement   ADL Overall ADL's : Needs assistance/impaired                 Upper Body Dressing : Minimal assistance;Sitting       Toilet Transfer: Contact guard assist;Rolling walker (2 wheels) Toilet Transfer Details (indicate cue type and reason): simulated to recliner via step pivot Toileting- Clothing Manipulation and Hygiene: Maximal assistance;Sit to/from stand Toileting - Clothing Manipulation Details (indicate cue type and reason): to clean buttocks d/t bleeding            Extremity/Trunk Assessment              Vision       Perception     Praxis     Communication Communication Communication: No apparent difficulties   Cognition Arousal: Alert Behavior During Therapy: [facility] for tasks assessed/performed                                 Following  commands: Intact        Cueing   Cueing Techniques: Verbal cues  Exercises      Shoulder Instructions       General Comments continues to actively bleed    Pertinent Vitals/ Pain       Pain Assessment Pain Assessment: No/denies pain  Home Living                                          Prior Functioning/Environment              Frequency  Min 2X/week        Progress Toward Goals  OT Goals(current goals can now be found in the care plan section)  Progress towards OT goals: Progressing toward goals  Acute Rehab OT Goals Patient Stated Goal: improve strength OT Goal Formulation: With patient Time For Goal  Achievement: 12/27/23 Potential to Achieve Goals: Good  Plan      Co-evaluation                 AM-PAC OT "6 Clicks" Daily Activity     Outcome Measure   Help from another person eating meals?: None Help from another person taking care of personal grooming?: None Help from another person toileting, which includes using toliet, bedpan, or urinal?: A Little Help from another person bathing (including washing, rinsing, drying)?: A Little Help from another person to put on and taking off regular upper body clothing?: None Help from another person to put on and taking off regular lower body clothing?: A Little 6 Click Score: 21    End of Session Equipment Utilized During Treatment: Gait belt;Rolling walker (2 wheels)  OT Visit Diagnosis: Other abnormalities of gait and mobility (R26.89);Muscle weakness (generalized) (M62.81)   Activity Tolerance Patient tolerated treatment well   Patient Left with call bell/phone within reach;in chair   Nurse Communication Mobility status        Time: 1610-9604 OT Time Calculation (min): 26 min  Charges: OT General Charges $OT Visit: 1 Visit OT Treatments $Self Care/Home Management : 8-22 mins $Therapeutic Activity: 8-22 mins  Esmay Amspacher, OTR/L  12/14/23, 4:08 PM   Dorla Guizar E Fortunato Nordin 12/14/2023, 4:07 PM

## 2023-12-14 NOTE — TOC Initial Note (Signed)
 Transition of Care [facility]) - Initial/Assessment Note    Patient Details  Name: Joel Harrell MRN: [id number] Date of Birth: July 07, 1956  Transition of Care [facility]) CM/SW Contact:    Odilia Bennett, LCSW Phone Number: 12/14/2023, 3:57 PM  Clinical Narrative: CSW met with patient. No family at bedside. CSW introduced role and explained that therapy recommendations would be discussed. Patient is agreeable to home health but does not have a PCP. He was planning on getting an appointment at [facility]. CSW called and got him an appointment with Dr. Edwina Gram on 7/21. Information added to AVS. Patient does not have a home health preference. [facility] accepted for PT, OT, RN and will have Thomasene Flemings, NP see him for primary care until his appointment with Dr. Tawnya Fava. DME recommendation for RW and 3-in-1. Patient is agreeable. CSW will order closer to discharge. No further concerns. CSW will continue to follow patient for support and facilitate return home once stable. Raford Bunk will transport at discharge.                 Expected Discharge Plan: Home w Home Health Services Barriers to Discharge: Continued Medical Work up   Patient Goals and CMS Choice            Expected Discharge Plan and Services     Post Acute Care Choice: Home Health, Durable Medical Equipment Living arrangements for the past 2 months: Apartment                           HH Arranged: RN, PT, OT HH Agency: [facility] Date [facility] Agency Contacted: 12/14/23   Representative spoke with at [facility] Agency: Bartholomew Light  Prior Living Arrangements/Services Living arrangements for the past 2 months: Apartment Lives with:: Significant Other Patient language and need for interpreter reviewed:: Yes Do you feel safe going back to the place where you live?: Yes      Need for Family Participation in Patient Care: Yes (Comment) Care giver support system in place?: Yes  (comment)   Criminal Activity/Legal Involvement Pertinent to Current Situation/Hospitalization: No - Comment as needed  Activities of Daily Living   ADL Screening (condition at time of admission) Independently performs ADLs?: No Does the patient have a NEW difficulty with bathing/dressing/toileting/self-feeding that is expected to last >3 days?: Yes (Initiates electronic notice to provider for possible OT consult) Does the patient have a NEW difficulty with getting in/out of bed, walking, or climbing stairs that is expected to last >3 days?: Yes (Initiates electronic notice to provider for possible PT consult) Does the patient have a NEW difficulty with communication that is expected to last >3 days?: No Is the patient deaf or have difficulty hearing?: No Does the patient have difficulty seeing, even when wearing glasses/contacts?: No Does the patient have difficulty concentrating, remembering, or making decisions?: No  Permission Sought/Granted Permission sought to share information with : Facility Industrial/product designer granted to share information with : Yes, Verbal Permission Granted     Permission granted to share info w AGENCY: Home Health Agencies        Emotional Assessment Appearance:: Appears stated age Attitude/Demeanor/Rapport: Engaged, Gracious Affect (typically observed): Accepting, Appropriate, Calm, Pleasant Orientation: : Oriented to Self, Oriented to Place, Oriented to  Time, Oriented to Situation Alcohol / Substance Use: Not Applicable Psych Involvement: No (comment)  Admission diagnosis:  Hypomagnesemia [E83.42] GI bleeding [K92.2] Gastrointestinal hemorrhage associated  with alcoholic gastritis [K29.21] Ascites due to alcoholic cirrhosis (HCC) [K70.31] Anemia, unspecified type [D64.9] Patient Active Problem List   Diagnosis Date Noted   Colon cancer (HCC) 12/14/2023   Overweight (BMI 25.0-29.9) 12/13/2023   Neoplasm of digestive system 12/13/2023    Mass of colon 12/13/2023   Hypokalemia 12/12/2023   Hyponatremia 12/12/2023   Hypomagnesemia 12/12/2023   Hypophosphatemia 12/12/2023   Portal hypertension (HCC) 12/12/2023   Ascites due to alcoholic cirrhosis (HCC) 12/11/2023   Alcohol abuse 12/11/2023   Chronic obstructive pulmonary disease (COPD) (HCC) 12/11/2023   Essential hypertension 12/11/2023   Pancytopenia (HCC) 12/11/2023   Acute blood loss anemia 12/11/2023   Iron deficiency anemia 12/11/2023   GI bleeding 12/10/2023   Acute hepatic encephalopathy (HCC) 11/22/2022   Iron deficiency anemia due to chronic blood loss 11/22/2022   Decompensated hepatic cirrhosis (HCC) 11/22/2022   Alcohol use disorder 11/22/2022   Hypertension 11/22/2022   COPD (chronic obstructive pulmonary disease) (HCC) 11/22/2022   PCP:  Patient, No Pcp Per Pharmacy:   [facility] - [place], [place] - [address] [place] [address] Phone: [phone] Fax: [phone]  [facility] #[id number] [place], [place] - [address] AT [facility] [address] [place] [address] Phone: [phone] Fax: [phone]  [facility] #[id number] [place], [place] - [address] AT [facility] [address] [place] [address] Phone: [phone] Fax: [phone]     Social Drivers of Health (SDOH) Social History: SDOH Screenings   Food Insecurity: No Food Insecurity (12/11/2023)  Housing: Low Risk  (12/11/2023)  Transportation Needs: No Transportation Needs (12/11/2023)  Utilities: Not At Risk (12/11/2023)  Social Connections: Moderately Integrated (12/11/2023)  Tobacco Use: Low Risk  (12/13/2023)   SDOH Interventions:     Readmission Risk Interventions     No data to display

## 2023-12-14 NOTE — Plan of Care (Signed)
   Problem: Education: Goal: Knowledge of General Education information will improve Description Including pain rating scale, medication(s)/side effects and non-pharmacologic comfort measures Outcome: Progressing

## 2023-12-15 ENCOUNTER — Ambulatory Visit

## 2023-12-15 ENCOUNTER — Other Ambulatory Visit: Payer: Self-pay

## 2023-12-15 DIAGNOSIS — Z515 Encounter for palliative care: Secondary | ICD-10-CM

## 2023-12-15 DIAGNOSIS — K921 Melena: Secondary | ICD-10-CM | POA: Diagnosis not present

## 2023-12-15 LAB — RAD ONC ARIA SESSION SUMMARY
Course Elapsed Days: 0
Plan Fractions Treated to Date: 1
Plan Prescribed Dose Per Fraction: 3 Gy
Plan Total Fractions Prescribed: 10
Plan Total Prescribed Dose: 30 Gy
Reference Point Dosage Given to Date: 3 Gy
Reference Point Session Dosage Given: 3 Gy
Session Number: 1

## 2023-12-15 LAB — HEMOGLOBIN
Hemoglobin: 7.6 g/dL — ABNORMAL LOW (ref 13.0–17.0)
Hemoglobin: 8 g/dL — ABNORMAL LOW (ref 13.0–17.0)

## 2023-12-15 LAB — PATHOLOGIST SMEAR REVIEW

## 2023-12-15 MED ORDER — ENOXAPARIN SODIUM 40 MG/0.4ML IJ SOSY
40.0000 mg | PREFILLED_SYRINGE | INTRAMUSCULAR | Status: DC
  Administered 2023-12-15 – 2023-12-17 (×3): 40 mg via SUBCUTANEOUS
  Filled 2023-12-15 (×3): qty 0.4

## 2023-12-15 MED ORDER — FUROSEMIDE 10 MG/ML IJ SOLN
40.0000 mg | Freq: Every day | INTRAMUSCULAR | Status: DC
  Administered 2023-12-15 – 2023-12-16 (×2): 40 mg via INTRAVENOUS
  Filled 2023-12-15 (×2): qty 4

## 2023-12-15 NOTE — Consult Note (Signed)
 Palliative Medicine [facility] at [facility] [phone] [phone]   Name: Joel Harrell Date: 12/15/2023 MRN: [id number]  DOB: [birth year]  Patient Care Team: Patient, No Pcp Per as PCP - General (General Practice)    REASON FOR CONSULTATION: Joel Harrell is a 68 y.o. male with multiple medical problems including hypertension, COPD, alcoholic cirrhosis with ascites.  Patient was admitted to hospital with GI bleed and underwent EGD on 12/12/2023 revealing portal hypertensive gastropathy.  Colonoscopy on 12/13/2023 revealed a nonobstructing mass in the sigmoid colon, which was oozing.  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  Patient was deemed not a surgical candidate.  Palliative care was consulted to address goals.  SOCIAL HISTORY:     reports that he has never smoked. He has never used smokeless tobacco. He reports current alcohol use of about 10.0 standard drinks of alcohol per week. He reports that he does not use drugs.  Patient is married and lives at home with his wife.  He has 5 sons, most of whom live out of state.  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  ADVANCE DIRECTIVES:  Does not have  CODE STATUS: Full code  PAST MEDICAL HISTORY: Past Medical History:  Diagnosis Date   Asthma    CHF (congestive heart failure) (HCC)    Hypertension     PAST SURGICAL HISTORY:  Past Surgical History:  Procedure Laterality Date   COLONOSCOPY N/A 12/13/2023   Procedure: COLONOSCOPY;  Surgeon: Marnee Sink, MD;  [facility] ENDOSCOPY;  Service: Endoscopy;  Laterality: N/A;   ESOPHAGOGASTRODUODENOSCOPY N/A 12/12/2023   Procedure: EGD (ESOPHAGOGASTRODUODENOSCOPY);  Surgeon: Quintin Buckle, DO;  Location: [facility] ENDOSCOPY;  Service: Gastroenterology;  Laterality: N/A;   HEMOSTASIS CLIP PLACEMENT  12/13/2023   Procedure: CONTROL OF HEMORRHAGE, GI TRACT, ENDOSCOPIC, BY CLIPPING OR OVERSEWING;  Surgeon: Marnee Sink,  MD;  Location: [facility] ENDOSCOPY;  Service: Endoscopy;;   IR ANGIOGRAM PELVIS SELECTIVE OR SUPRASELECTIVE  12/13/2023   POLYPECTOMY  12/13/2023   Procedure: POLYPECTOMY, INTESTINE;  Surgeon: Marnee Sink, MD;  Location: [facility] ENDOSCOPY;  Service: Endoscopy;;    HEMATOLOGY/ONCOLOGY HISTORY:  Oncology History   No history exists.    ALLERGIES:  has no known allergies.  MEDICATIONS:  Current Facility-Administered Medications  Medication Dose Route Frequency Provider Last Rate Last Admin   acetaminophen  (TYLENOL ) tablet 650 mg  650 mg Oral Q6H PRN Mansy, Jan A, MD   650 mg at 12/14/23 1806   Or   acetaminophen  (TYLENOL ) suppository 650 mg  650 mg Rectal Q6H PRN Mansy, Jan A, MD       albuterol  (PROVENTIL ) (2.5 MG/3ML) 0.083% nebulizer solution 3 mL  3 mL Nebulization Q4H PRN Donaciano Frizzle, MD   3 mL at 12/14/23 0416   amLODipine  (NORVASC ) tablet 5 mg  5 mg Oral Daily Mansy, Jan A, MD   5 mg at 12/15/23 0926   bisacodyl (DULCOLAX) EC tablet 10 mg  10 mg Oral BID PRN Mansy, Jan A, MD   10 mg at 12/13/23 0300   Chlorhexidine Gluconate Cloth 2 % PADS 6 each  6 each Topical Daily Donaciano Frizzle, MD   6 each at 12/15/23 0926   folic acid  (FOLVITE ) tablet 1 mg  1 mg Oral Daily Mansy, Jan A, MD   1 mg at 12/15/23 6213   lisinopril  (ZESTRIL ) tablet 20 mg  20 mg Oral Daily Mansy, Jan A, MD   20 mg at 12/15/23 0865   magnesium   hydroxide (MILK OF MAGNESIA) suspension 30 mL  30 mL Oral Daily PRN Mansy, Jan A, MD       montelukast  (SINGULAIR ) tablet 10 mg  10 mg Oral QHS Mansy, Jan A, MD   10 mg at 12/14/23 2132   ondansetron  (ZOFRAN ) tablet 4 mg  4 mg Oral Q6H PRN Mansy, Jan A, MD       Or   ondansetron  (ZOFRAN ) injection 4 mg  4 mg Intravenous Q6H PRN Mansy, Jan A, MD       polyethylene glycol-electrolytes (NuLYTELY ) solution 2,000 mL  2,000 mL Oral Once PRN Quintin Buckle, DO       sodium phosphate  (FLEET) enema 1 enema  1 enema Rectal Daily PRN Mansy, Jan A, MD   1 enema at 12/13/23 0300   thiamine   (VITAMIN B1) tablet 100 mg  100 mg Oral Daily Mansy, Jan A, MD   100 mg at 12/15/23 0925   traZODone (DESYREL) tablet 25 mg  25 mg Oral QHS PRN Mansy, Jan A, MD        VITAL SIGNS: BP (P) 117/74 (BP Location: Left Arm)   Pulse (P) 90   Temp (P) 98.5 F (36.9 C) (Oral)   Resp (P) 18   Ht 6\' 3"  (1.905 m)   Wt 233 lb 8 oz (105.9 kg)   SpO2 (P) 98%   BMI 29.19 kg/m  Filed Weights   12/10/23 2142 12/11/23 0201  Weight: 249 lb 8 oz (113.2 kg) 233 lb 8 oz (105.9 kg)    Estimated body mass index is 29.19 kg/m as calculated from the following:   Height as of this encounter: 6\' 3"  (1.905 m).   Weight as of this encounter: 233 lb 8 oz (105.9 kg).  LABS: CBC:    Component Value Date/Time   WBC 5.0 12/14/2023 0529   HGB 8.0 (L) 12/15/2023 0807   HGB 16.4 01/21/2014 0414   HCT 24.6 (L) 12/14/2023 0529   HCT 48.9 01/21/2014 0414   PLT 140 (L) 12/14/2023 0529   PLT 165 01/21/2014 0414   MCV 75.2 (L) 12/14/2023 0529   MCV 98 01/21/2014 0414   NEUTROABS 1.1 (L) 11/21/2022 1841   NEUTROABS 12.0 (H) 01/21/2014 0414   LYMPHSABS 2.2 11/21/2022 1841   LYMPHSABS 1.0 01/21/2014 0414   MONOABS 0.2 11/21/2022 1841   MONOABS 0.4 01/21/2014 0414   EOSABS 0.1 11/21/2022 1841   EOSABS 0.0 01/21/2014 0414   BASOSABS 0.1 11/21/2022 1841   BASOSABS 0.0 01/21/2014 0414   Comprehensive Metabolic Panel:    Component Value Date/Time   NA 137 12/14/2023 0529   NA 136 01/21/2014 0414   K 3.6 12/14/2023 0529   K 3.9 01/21/2014 0414   CL 106 12/14/2023 0529   CL 103 01/21/2014 0414   CO2 22 12/14/2023 0529   CO2 25 01/21/2014 0414   BUN 9 12/14/2023 0529   BUN 19 (H) 01/21/2014 0414   CREATININE 0.79 12/14/2023 0529   CREATININE 1.06 01/24/2014 0357   GLUCOSE 100 (H) 12/14/2023 0529   GLUCOSE 126 (H) 01/21/2014 0414   CALCIUM 7.5 (L) 12/14/2023 0529   CALCIUM 8.7 01/21/2014 0414   AST 22 12/14/2023 0529   AST 161 (H) 01/20/2014 0220   ALT 10 12/14/2023 0529   ALT 197 (H) 01/20/2014 0220    ALKPHOS 54 12/14/2023 0529   ALKPHOS 70 01/20/2014 0220   BILITOT 3.1 (H) 12/14/2023 0529   BILITOT 0.9 01/20/2014 0220   PROT 6.4 (L) 12/14/2023 9562  PROT 9.2 (H) 01/20/2014 0220   ALBUMIN 2.2 (L) 12/14/2023 0529   ALBUMIN 3.6 01/20/2014 0220    RADIOGRAPHIC STUDIES: US  Paracentesis Result Date: 12/14/2023 INDICATION: 68 year old male with a history of alcoholic liver cirrhosis who presented to the ED with lower extremity edema and abdominal distention. Request for diagnostic and therapeutic paracentesis. EXAM: ULTRASOUND GUIDED left PARACENTESIS MEDICATIONS: 1% lidocaine , 7 mL. COMPLICATIONS: None immediate. PROCEDURE: Informed written consent was obtained from the patient after a discussion of the risks, benefits and alternatives to treatment. A timeout was performed prior to the initiation of the procedure. Initial ultrasound scanning demonstrates a large amount of ascites within the left lower abdominal quadrant. The left lower abdomen was prepped and draped in the usual sterile fashion. 1% lidocaine  was used for local anesthesia. Following this, a 19 gauge, 7-cm, Yueh catheter was introduced. An ultrasound image was saved for documentation purposes. The paracentesis was performed. The catheter was removed and a dressing was applied. The patient tolerated the procedure well without immediate post procedural complication. Patient received post-procedure intravenous albumin; see nursing notes for details. FINDINGS: A total of approximately 3 L of amber fluid was removed. Samples were sent to the laboratory as requested by the clinical team. IMPRESSION: Successful ultrasound-guided paracentesis yielding 3 liters of peritoneal fluid. PLAN: If the patient eventually requires >/=2 paracenteses in a 30 day period, candidacy for formal evaluation by the [facility] Interventional Radiology Portal Hypertension [facility] will be assessed. Procedure performed by: Estella Helling, PA-C under the supervision of  Dr. Irine Manning Electronically Signed   By: Elene Griffes M.D.   On: 12/14/2023 12:53   IR Angiogram Pelvis Selective Or Supraselective Result Date: 12/14/2023 CLINICAL DATA:  Active GI bleed in the distal transverse colon near an endoscopic clip on recent CTA EXAM: MESENTERIC SELECTIVE ARTERIOGRAPHY ANESTHESIA/SEDATION: Intravenous Fentanyl 25mcg administered by RN during a total moderate (conscious) sedation time of 48 minutes; the patient's level of consciousness and physiological / cardiorespiratory status were monitored continuously by radiology RN under my direct supervision. MEDICATIONS: Lidocaine  1% subcutaneous CONTRAST:  85mL OMNIPAQUE IOHEXOL 300 MG/ML  SOLN PROCEDURE: The procedure, risks (including but not limited to bleeding, infection, organ damage ), benefits, and alternatives were explained to the patient and spouse. Questions regarding the procedure were encouraged and answered. The patient understands and consents to the procedure. Right femoral region prepped and draped in usual sterile fashion. Maximal barrier sterile technique was utilized including caps, mask, sterile gowns, sterile gloves, sterile drape, hand hygiene and skin antiseptic. The right common femoral artery was localized under ultrasound. Under real-time ultrasound guidance, the vessel was accessed with a 21-gauge micropuncture needle, exchanged over a 018 guidewire for a transitional dilator, through which a 035 guidewire was advanced. Over this, a 5 [place] vascular sheath was placed, through which a 5 French rim catheter was advanced and used to selectively catheterize the inferior mesenteric artery for selective arteriography in multiple projections. The catheter was then exchanged over the guidewire for a C2 catheter, then utilized to selectively catheterize the superior mesenteric artery for selective arteriography in multiple projections. The catheter and sheath were removed and hemostasis achieved with the aid of the Celt device  under ultrasound guidance. The patient tolerated the procedure well. COMPLICATIONS: None immediate FINDINGS: No evidence of active extravasation, early draining vein, AVM, or other lesion to suggest a site or etiology of the patient's GI bleeding. Endoscopic clip in the distal transverse colon, corresponding to findings from recent CTA. Venous phase confirms patency of the  IMV and portal venous system. IMPRESSION: 1. Negative 2-vessel mesenteric arteriogram. No evidence of active extravasation or other focal lesion to suggest etiology of GI bleed. Electronically Signed   By: Nicoletta Barrier M.D.   On: 12/14/2023 02:08   CT ANGIO GI BLEED Result Date: 12/13/2023 CLINICAL DATA:  Lower GI bleeding * Tracking Code: BO * EXAM: CTA ABDOMEN AND PELVIS WITHOUT AND WITH CONTRAST TECHNIQUE: Multidetector CT imaging of the abdomen and pelvis was performed using the standard protocol during bolus administration of intravenous contrast. Multiplanar reconstructed images and MIPs were obtained and reviewed to evaluate the vascular anatomy. RADIATION DOSE REDUCTION: This exam was performed according to the departmental dose-optimization program which includes automated exposure control, adjustment of the mA and/or kV according to patient size and/or use of iterative reconstruction technique. CONTRAST:  100mL OMNIPAQUE IOHEXOL 350 MG/ML SOLN COMPARISON:  None Available. FINDINGS: VASCULAR Normal contour and caliber of the abdominal aorta. No evidence of aneurysm, dissection, or other acute aortic pathology. 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 Mild aortic atherosclerosis. Review of the MIP images confirms the above findings. NON-VASCULAR Lower Chest: Moderate right, small left pleural effusions and associated atelectasis or consolidation. Three-vessel coronary artery calcifications. Hepatobiliary: No solid liver abnormality is seen. Coarse  contour of the liver. Gallstones. No biliary ductal dilatation. Pancreas: Unremarkable. No pancreatic ductal dilatation or surrounding inflammatory changes. Spleen: Splenomegaly, maximum span 15.5 cm. Adrenals/Urinary Tract: Adrenal glands are unremarkable. Kidneys are normal, without renal calculi, solid lesion, or hydronephrosis. Foley catheter in the bladder. Stomach/Bowel: Small gastroesophageal varices. The colon is filled with fluid and heterogeneously hyperdense material to the rectum. Endoluminal clip within the redundant distal transverse colon with arterial extravasation arising in this vicinity (series 18, image 107). Contrast enhancing endoluminal mass of the very redundant mid sigmoid colon measuring 2.4 x 2.1 cm (series 18, image 126) Lymphatic: No enlarged abdominal or pelvic lymph nodes. Reproductive: No mass or other significant abnormality. Other: No abdominal wall hernia. Severe anasarca. Moderate volume ascites throughout the abdomen pelvis. Musculoskeletal: No acute osseous findings. IMPRESSION: 1. Endoluminal clip within the redundant distal transverse colon with arterial extravasation arising in this vicinity, consistent with active GI bleeding. 2. Contrast enhancing endoluminal mass of the very redundant mid sigmoid colon measuring 2.4 x 2.1 cm, highly concerning for primary colon malignancy. 3. The colon is filled with fluid and heterogeneously hyperdense material to the rectum, consistent with blood products. 4. Normal contour and caliber of the abdominal aorta. No evidence of aneurysm, dissection, or other acute aortic pathology. Mild aortic atherosclerosis. 5. Cirrhosis and splenomegaly.  Small gastroesophageal varices. 6. Pleural effusions, severe anasarca, and ascites. 7. Cholelithiasis.  Coronary artery disease. These results will be called to the ordering clinician or representative by the Radiologist Assistant, and communication documented in the PACS or [employer]. Aortic  Atherosclerosis (ICD10-I70.0). Electronically Signed   By: Fredricka Jenny M.D.   On: 12/13/2023 20:50   US  Abdomen Limited RUQ (LIVER/GB) Result Date: 12/12/2023 CLINICAL DATA:  [id number] Ascites due to alcoholic cirrhosis (HCC) [id number] EXAM: ULTRASOUND ABDOMEN LIMITED RIGHT UPPER QUADRANT COMPARISON:  Nov 22, 2022 FINDINGS: Gallbladder: Multiple small gallstones. No wall thickening or pericholecystic fluid. No sonographic Murphy's sign noted by sonographer. Common bile duct: Diameter: 3 mm Liver: Nodular contour with coarsened echogenicity. No focal lesion identified. No intrahepatic biliary ductal dilation. Portal vein is patent on color Doppler imaging with normal direction of blood flow towards the  liver. Other: Moderate volume ascites.  Splenomegaly. IMPRESSION: 1. Cholecystolithiasis.  No changes of acute cholecystitis. 2. Cirrhotic morphology of the liver with sequelae of portal hypertension, including splenomegaly and moderate volume ascites. Continued biannual HCC surveillance recommended. Electronically Signed   By: Rance Burrows M.D.   On: 12/12/2023 16:05   DG Chest Portable 1 View Result Date: 12/10/2023 CLINICAL DATA:  Chest pain EXAM: PORTABLE CHEST 1 VIEW COMPARISON:  None Available. FINDINGS: Mild right basilar atelectasis. Lungs are otherwise clear. No pneumothorax or pleural effusion. Cardiac size within normal limits. Pulmonary vascularity is normal. Osseous structures are age-appropriate. No acute bone abnormality. IMPRESSION: No active disease. Electronically Signed   By: Worthy Heads M.D.   On: 12/10/2023 22:20    PERFORMANCE STATUS (ECOG) : 2 - Symptomatic, <50% confined to bed  Review of Systems Unless otherwise noted, a complete review of systems is negative.  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  I met with patient to discuss goals.  Patient states that he is aware that workup is consistent with cancer.  He has met with radiation oncology with plan to begin XRT.  Patient is not felt to be a surgical candidate in light of his advanced cirrhosis and high risk of postoperative mortality.  Child Pugh class C with MELD score of 19.  Patient verbalized understanding that cancer treatment options may be limited.  We discussed possible hospice involvement.  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  Patient does not feel that he is ready for hospice and would desire any treatment options available.  Patient will likely benefit from outpatient follow-up.  We discussed CODE STATUS.  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  Patient tells me "I am going to be around a while."  PLAN: - Continue current scope of treatment - Full code - Outpatient follow-up  Case and plan discussed with Dr. Randy Buttery  Time Total: 30 minutes  Visit consisted of counseling and education dealing with the complex and emotionally intense issues of symptom management and palliative care in the setting of serious and potentially life-threatening illness.Greater than 50%  of this time was spent counseling and coordinating care related to the above assessment and plan.  Signed by: Gerilyn Kobus, PhD, NP-C

## 2023-12-15 NOTE — Progress Notes (Signed)
 PROGRESS NOTE    Joel Harrell  UEA:[id number] DOB: [birth year] DOA: 12/10/2023 PCP: Patient, No Pcp Per  257A/257A-AA  LOS: 5 days   Brief hospital course:   Assessment & Plan: Joel Harrell is a 68 y.o. male with medical history significant for asthma, CHF, hypertension, alcoholic liver cirrhosis, and COPD, who presented to the emergency room with acute onset of bilateral lower extremity edema as well as abdominal distention with scrotal edema worsening over the last week. He admitted to drinking 12 pack beer per week although his family stated that he also drinks liquor.  Upon arriving to hospital, hemoglobin was 4.2.  Patient states that he had a black stool a month ago, since then, he has been having brown stools every day. Due to liver cirrhosis, patient was given Protonix , octreotide, and PRBC.  GI consult obtained.  EGD showed portal hypertension gastropathy, esophageal varices.  No active bleeding. Colonoscopy was performed 6/2, identified a mass in the sigmoid colon, with oozing blood.  Biopsy results came back with colon cancer.  Radiation oncology consulted.  Acute lower GI bleeding secondary to colon cancer.   Patient came in with hemoglobin of 4.3, initially received 4 units of PRBC and IV iron. Patient had EGD performed on 6/1, showed esophageal varices and portal hypertensive gastropathy, but did not show any active bleeding.   However, colonoscopy showed sigmoid mass with oozing of blood.  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   Patient had a worsening rectal bleeding after biopsy, CT angiogram initially showed active bleeding, IR performed mesentery angiogram later, did not show any active bleeding. Patient required 2 more units of blood transfusion.  Dr. Jeane Miguel contacted [facility], tried to transfer to surgery service.  However, transfer was declined, they do not believe patient can survive surgery.  They recommended palliative care versus hospice. --oncology and  radiation oncology consulted --started on radiation     Acute blood loss anemia secondary to GI bleed. Iron deficient anemia.   --monitor Hgb and transfuse to keep Hgb >7  Pancytopenia secondary to liver cirrhosis.  Alcoholic cirrhosis of liver with ascites (HCC) Portal hypertension gastropathy.  Esophageal varices. Alcohol abuse. --paracentesis on 6/3 with 3L removed --cont thiamine  and folic acid   Anasarca  --severe penile and scrotal swelling --IV lasix 40 today    Hyponatremia   Hypokalemia. Hypomagnesemia. Hypophosphatemia. --monitor and supplement PRN   Urinary retention secondary to benign prostate hypertrophy and scrotal edema. Foley catheter was anchored by urology.  Continue to follow. Will keep Foley catheter at discharge, patient will be followed with urology as outpatient.   Essential hypertension --hold amlodipine  and Lisinopril  while diuresing   Chronic obstructive pulmonary disease (COPD) (HCC) --stable    DVT prophylaxis: Lovenox SQ Code Status: Full code  Family Communication: wife updated at bedside today Level of care: Progressive Dispo:   The patient is from: home Anticipated d/c is to: home Anticipated d/c date is: >3 days   Subjective and Interval History:  Pt reported worsening swelling in penis and scrotum.   Objective: Vitals:   12/15/23 0848 12/15/23 1108 12/15/23 1537 12/15/23 1740  BP: 117/74 122/79  120/71  Pulse: 90 95  (!) 101  Resp: 18 18  18   Temp: 98.5 F (36.9 C) 97.7 F (36.5 C)  98.9 F (37.2 C)  TempSrc: Oral   Oral  SpO2: 98% 99% 98% 99%  Weight:      Height:        Intake/Output Summary (Last 24 hours) at 12/15/2023 1832  Last data filed at 12/15/2023 1300 Gross per 24 hour  Intake 1200 ml  Output 600 ml  Net 600 ml   Filed Weights   12/10/23 2142 12/11/23 0201  Weight: 113.2 kg 105.9 kg    Examination:   Constitutional: NAD, AAOx3 HEENT: conjunctivae and lids normal, EOMI CV: No cyanosis.    RESP: normal respiratory effort, on RA Extremities: tense edema in BLE SKIN: warm, dry Neuro: II - XII grossly intact.   Psych: Normal mood and affect.  Appropriate judgement and reason  Penis and scrotum swollen.   Data Reviewed: I have personally reviewed labs and imaging studies  Time spent: 50 minutes  Garrison Kanner, MD [facility] If 7PM-7AM, please contact night-coverage 12/15/2023, 6:32 PM

## 2023-12-15 NOTE — Plan of Care (Signed)

## 2023-12-15 NOTE — Progress Notes (Signed)
 Physical Therapy Treatment Patient Details Name: Joel Harrell MRN: [id number] DOB: 25-Mar-1956 Today's Date: 12/15/2023   History of Present Illness Pt is a 68 yo male admitted for potential GI bleed, LE edema. PMH of asthma, CHF, HTN, etoh.    PT Comments  Pt did well with prolonged bout of ambulation despite having to alter his gait 2/2 swelling issues.  Pt was able to ambulate w/o AD with relative ease, did have lateral lean and wide BOS but no LOBs or safety issues.  Promoted ankle pumps and general in bed activity as well as LE elevation and pt voices understanding.  Overall pt doing well, not at his baseline, will benefit from continued PT per POC.     If plan is discharge home, recommend the following: A little help with bathing/dressing/bathroom;Assistance with cooking/housework;A little help with walking and/or transfers;Assist for transportation;Help with stairs or ramp for entrance   Can travel by private vehicle        Equipment Recommendations  Rolling walker (2 wheels);BSC/3in1    Recommendations for Other Services       Precautions / Restrictions Precautions Precautions: Fall Recall of Precautions/Restrictions: Intact Restrictions Weight Bearing Restrictions Per Provider Order: No     Mobility  Bed Mobility Overal bed mobility: Modified Independent Bed Mobility: Supine to Sit, Sit to Supine     Supine to sit: Modified independent (Device/Increase time) Sit to supine: Modified independent (Device/Increase time)   General bed mobility comments: pt able to get LEs in and out of bed, used rails to scoot up in bed and ultimately showed good tolerance with mobility, etc    Transfers Overall transfer level: Modified independent Equipment used: None Transfers: Sit to/from Stand, Bed to chair/wheelchair/BSC Sit to Stand: Contact guard assist   Step pivot transfers: Contact guard assist            Ambulation/Gait Ambulation/Gait assistance: Contact guard  assist Gait Distance (Feet): 500 Feet Assistive device: None         General Gait Details: wide BOS (2/2 scrotal swelling), Reciprocal gait pattern as tolerated per posturing.  Pt's HR up to ~120, O2 remains in the 90s - mild c/o fatigue with the prolonged bout of ambulation. Initially started using RW, but quickly transitioned to single UE on rail and then no UEs at all. Pt with no LOBs, lateral leans associated with swelling but safe and appropriate with the effort.   Stairs             Wheelchair Mobility     Tilt Bed    Modified Rankin (Stroke Patients Only)       Balance Overall balance assessment: Needs assistance Sitting-balance support: Feet supported Sitting balance-[name]: Good     Standing balance support: Reliant on assistive device for balance, Bilateral upper extremity supported Standing balance-[name]: Good                              Communication Communication Communication: No apparent difficulties  Cognition Arousal: Alert Behavior During Therapy: WFL for tasks assessed/performed   PT - Cognitive impairments: No apparent impairments                                Cueing Cueing Techniques: Verbal cues  Exercises      General Comments        Pertinent Vitals/Pain Pain Assessment Pain Assessment: 0-10 Pain  Score: 8  Pain Location: groin swelling    Home Living                          Prior Function            PT Goals (current goals can now be found in the care plan section) Progress towards PT goals: Progressing toward goals    Frequency    Min 2X/week      PT Plan      Co-evaluation              AM-PAC PT "6 Clicks" Mobility   Outcome Measure  Help needed turning from your back to your side while in a flat bed without using bedrails?: None Help needed moving from lying on your back to sitting on the side of a flat bed without using bedrails?: None Help needed  moving to and from a bed to a chair (including a wheelchair)?: A Little Help needed standing up from a chair using your arms (e.g., wheelchair or bedside chair)?: A Little Help needed to walk in hospital room?: A Little Help needed climbing 3-5 steps with a railing? : A Little 6 Click Score: 20    End of Session Equipment Utilized During Treatment: Gait belt Activity Tolerance: Patient tolerated treatment well Patient left: with call bell/phone within reach;with bed alarm set;with family/visitor present Nurse Communication: Mobility status PT Visit Diagnosis: Other abnormalities of gait and mobility (R26.89);Difficulty in walking, not elsewhere classified (R26.2);Muscle weakness (generalized) (M62.81)     Time: 8119-1478 PT Time Calculation (min) (ACUTE ONLY): 16 min  Charges:    $Gait Training: 8-22 mins PT General Charges $$ ACUTE PT VISIT: 1 Visit                     Darice Edelman, DPT 12/15/2023, 3:58 PM

## 2023-12-15 NOTE — Plan of Care (Signed)
   Problem: Education: Goal: Knowledge of General Education information will improve Description Including pain rating scale, medication(s)/side effects and non-pharmacologic comfort measures Outcome: Progressing

## 2023-12-16 ENCOUNTER — Other Ambulatory Visit: Payer: Self-pay

## 2023-12-16 ENCOUNTER — Ambulatory Visit

## 2023-12-16 ENCOUNTER — Inpatient Hospital Stay

## 2023-12-16 DIAGNOSIS — K921 Melena: Secondary | ICD-10-CM | POA: Diagnosis not present

## 2023-12-16 LAB — PREPARE RBC (CROSSMATCH)

## 2023-12-16 LAB — RAD ONC ARIA SESSION SUMMARY
Course Elapsed Days: 1
Plan Fractions Treated to Date: 2
Plan Prescribed Dose Per Fraction: 3 Gy
Plan Total Fractions Prescribed: 10
Plan Total Prescribed Dose: 30 Gy
Reference Point Dosage Given to Date: 6 Gy
Reference Point Session Dosage Given: 3 Gy
Session Number: 2

## 2023-12-16 LAB — CBC
HCT: 25.1 % — ABNORMAL LOW (ref 39.0–52.0)
Hemoglobin: 7.4 g/dL — ABNORMAL LOW (ref 13.0–17.0)
MCH: 22.9 pg — ABNORMAL LOW (ref 26.0–34.0)
MCHC: 29.5 g/dL — ABNORMAL LOW (ref 30.0–36.0)
MCV: 77.7 fL — ABNORMAL LOW (ref 80.0–100.0)
Platelets: 110 10*3/uL — ABNORMAL LOW (ref 150–400)
RBC: 3.23 MIL/uL — ABNORMAL LOW (ref 4.22–5.81)
RDW: 30.1 % — ABNORMAL HIGH (ref 11.5–15.5)
WBC: 3.7 10*3/uL — ABNORMAL LOW (ref 4.0–10.5)
nRBC: 0 % (ref 0.0–0.2)

## 2023-12-16 LAB — BASIC METABOLIC PANEL WITH GFR
Anion gap: 4 — ABNORMAL LOW (ref 5–15)
BUN: 10 mg/dL (ref 8–23)
CO2: 24 mmol/L (ref 22–32)
Calcium: 7.4 mg/dL — ABNORMAL LOW (ref 8.9–10.3)
Chloride: 107 mmol/L (ref 98–111)
Creatinine, Ser: 0.81 mg/dL (ref 0.61–1.24)
GFR, Estimated: >60 mL/min (ref >60)
Glucose, Bld: 99 mg/dL (ref 70–99)
Potassium: 3 mmol/L — ABNORMAL LOW (ref 3.5–5.1)
Sodium: 135 mmol/L (ref 135–145)

## 2023-12-16 LAB — MAGNESIUM: Magnesium: 1.5 mg/dL — ABNORMAL LOW (ref 1.7–2.4)

## 2023-12-16 LAB — CEA: CEA: 14.3 ng/mL — ABNORMAL HIGH (ref 0.0–4.7)

## 2023-12-16 MED ORDER — FUROSEMIDE 10 MG/ML IJ SOLN
40.0000 mg | Freq: Two times a day (BID) | INTRAMUSCULAR | Status: DC
  Administered 2023-12-16 – 2023-12-18 (×5): 40 mg via INTRAVENOUS
  Filled 2023-12-16 (×5): qty 4

## 2023-12-16 MED ORDER — SODIUM CHLORIDE 0.9% IV SOLUTION: Freq: Once | INTRAVENOUS | Status: AC

## 2023-12-16 MED ORDER — POTASSIUM CHLORIDE CRYS ER 20 MEQ PO TBCR
40.0000 meq | EXTENDED_RELEASE_TABLET | ORAL | Status: AC
  Administered 2023-12-16 (×2): 40 meq via ORAL
  Filled 2023-12-16 (×2): qty 2

## 2023-12-16 MED ORDER — MAGNESIUM SULFATE 2 GM/50ML IV SOLN
2.0000 g | Freq: Once | INTRAVENOUS | Status: AC
  Administered 2023-12-16: 2 g via INTRAVENOUS
  Filled 2023-12-16: qty 50

## 2023-12-16 NOTE — Progress Notes (Signed)
 Occupational Therapy Treatment Patient Details Name: Joel Harrell MRN: [id number] DOB: 1956-02-13 Today's Date: 12/16/2023   History of present illness Pt is a 68 yo male admitted for potential GI bleed, LE edema. PMH of asthma, CHF, HTN, etoh.   OT comments  Pt is supine in bed on arrival.Pleasant and agreeable to OT session. He denies pain. Pt performed bed mobility with MOD I, STS from EOB with supervision and ambulated within the room without AD with close SBA/CGA. Pt required supervision for toilet transfer to [name] over top of toilet, pt able to perform peri-care without difficulty. Pt then performed full bathing, dressing and oral care standing at sink with SBA for safety and no LOB. Min A needed for LB bathing of bil feet. Pt ambulated to recliner and was left with all needs in place and will cont to require skilled acute OT services to maximize his safety and IND to return to PLOF.       If plan is discharge home, recommend the following:  A little help with walking and/or transfers;A little help with bathing/dressing/bathroom;Help with stairs or ramp for entrance;Assistance with cooking/housework   Equipment Recommendations  BSC/3in1;Other (comment) (BSC to place over toilet d/t his height)    Recommendations for Other Services      Precautions / Restrictions Precautions Precautions: Fall Recall of Precautions/Restrictions: Intact Restrictions Weight Bearing Restrictions Per Provider Order: No       Mobility Bed Mobility Overal bed mobility: Modified Independent                  Transfers Overall transfer level: Needs assistance   Transfers: Sit to/from Stand Sit to Stand: Contact guard assist           General transfer comment: CGA for STS from EOB and ambulation within room to bathroom and back to recliner with CGA for safety, wider BOS noted     Balance Overall balance assessment: Needs assistance Sitting-balance support: Feet supported Sitting  balance-[name]: Good     Standing balance support: No upper extremity supported, During functional activity Standing balance-[name]: Good Standing balance comment: no LOB during standing ADLs performed in bathroom without support                           ADL either performed or assessed with clinical judgement   ADL Overall ADL's : Needs assistance/impaired     Grooming: Wash/dry hands;Wash/dry face;Oral care;Applying deodorant;Standing;Supervision/safety   Upper Body Bathing: Supervision/ safety;Standing   Lower Body Bathing: Sit to/from stand;Sitting/lateral leans;Minimal assistance Lower Body Bathing Details (indicate cue type and reason): assist for bil feet Upper Body Dressing : Supervision/safety;Standing Upper Body Dressing Details (indicate cue type and reason): to doff/don gown     Toilet Transfer: Supervision/safety;Regular Toilet;BSC/3in1 Statistician Details (indicate cue type and reason): BSC over toilet Toileting- Clothing Manipulation and Hygiene: Sitting/lateral lean;Modified independent       Functional mobility during ADLs: Contact guard assist      Extremity/Trunk Assessment              Vision       Perception     Praxis     Communication Communication Communication: No apparent difficulties   Cognition Arousal: Alert Behavior During Therapy: [name] for tasks assessed/performed                                 Following  commands: Intact        Cueing   Cueing Techniques: Verbal cues  Exercises      Shoulder Instructions       General Comments      Pertinent Vitals/ Pain       Pain Assessment Pain Assessment: No/denies pain Pain Intervention(s): Monitored during session  Home Living                                          Prior Functioning/Environment              Frequency  Min 2X/week        Progress Toward Goals  OT Goals(current goals can now be  found in the care plan section)  Progress towards OT goals: Progressing toward goals  Acute Rehab OT Goals Patient Stated Goal: return home OT Goal Formulation: With patient Time For Goal Achievement: 12/27/23 Potential to Achieve Goals: Good  Plan      Co-evaluation                 AM-PAC OT "6 Clicks" Daily Activity     Outcome Measure   Help from another person eating meals?: None Help from another person taking care of personal grooming?: None Help from another person toileting, which includes using toliet, bedpan, or urinal?: A Little Help from another person bathing (including washing, rinsing, drying)?: A Little Help from another person to put on and taking off regular upper body clothing?: None Help from another person to put on and taking off regular lower body clothing?: A Little 6 Click Score: 21    End of Session    OT Visit Diagnosis: Other abnormalities of gait and mobility (R26.89);Muscle weakness (generalized) (M62.81)   Activity Tolerance Patient tolerated treatment well   Patient Left with call bell/phone within reach;in chair;with chair alarm set   Nurse Communication Mobility status        Time: 1610-9604 OT Time Calculation (min): 33 min  Charges: OT General Charges $OT Visit: 1 Visit OT Treatments $Self Care/Home Management : 23-37 mins  Joel Harrell, OTR/L  12/16/23, 11:58 AM   Joel Harrell 12/16/2023, 11:54 AM

## 2023-12-16 NOTE — Progress Notes (Signed)
 PROGRESS NOTE    Joel Harrell  WUJ:[id number] DOB: 05/24/1956 DOA: 12/10/2023 PCP: Patient, No Pcp Per  257A/257A-AA  LOS: 6 days   Brief hospital course:   Assessment & Plan: Joel Harrell is a 68 y.o. male with medical history significant for asthma, CHF, hypertension, alcoholic liver cirrhosis, and COPD, who presented to the emergency room with acute onset of bilateral lower extremity edema as well as abdominal distention with scrotal edema worsening over the last week. He admitted to drinking 12 pack beer per week although his family stated that he also drinks liquor.  Upon arriving to hospital, hemoglobin was 4.2.  Patient states that he had a black stool a month ago, since then, he has been having brown stools every day. Due to liver cirrhosis, patient was given Protonix , octreotide, and PRBC.  GI consult obtained.  EGD showed portal hypertension gastropathy, esophageal varices.  No active bleeding. Colonoscopy was performed 6/2, identified a mass in the sigmoid colon, with oozing blood.  Biopsy results came back with colon cancer.  Radiation oncology consulted.  Acute lower GI bleeding secondary to colon cancer.   Patient came in with hemoglobin of 4.3, initially received 4 units of PRBC and IV iron. Patient had EGD performed on 6/1, showed esophageal varices and portal hypertensive gastropathy, but did not show any active bleeding.   However, colonoscopy showed sigmoid mass with oozing of blood.  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   Patient had a worsening rectal bleeding after biopsy, CT angiogram initially showed active bleeding, IR performed mesentery angiogram later, did not show any active bleeding. Patient required 2 more units of blood transfusion.  Dr. Jeane Harrell contacted [facility], tried to transfer to surgery service.  However, transfer was declined, they do not believe patient can survive surgery.  They recommended palliative care versus hospice. --oncology and  radiation oncology consulted --started on radiation  --cont radiation tx   Acute blood loss anemia secondary to GI bleed. Iron deficient anemia.   --6u pRBC so far --7th unit pRBC today for Hgb 7.4  Pancytopenia secondary to liver cirrhosis.  Alcoholic cirrhosis of liver with ascites (HCC) Portal hypertension gastropathy.  Esophageal varices. Alcohol abuse. --paracentesis on 6/3 with 3L removed --cont thiamine  and folic acid   Anasarca  --severe penile and scrotal swelling --IV lasix 40 BID  Hyponatremia   Hypokalemia. Hypomagnesemia. Hypophosphatemia. --monitor and supplement PRN   Urinary retention secondary to benign prostate hypertrophy and scrotal edema. Foley catheter was anchored by urology.  Continue to follow. Will keep Foley catheter at discharge, patient will be followed with urology as outpatient.   Essential hypertension --hold amlodipine  and Lisinopril  while diuresing   Chronic obstructive pulmonary disease (COPD) (HCC) --stable    DVT prophylaxis: Lovenox SQ Code Status: Full code  Family Communication:  Level of care: Med-Surg Dispo:   The patient is from: home Anticipated d/c is to: home Anticipated d/c date is: >3 days   Subjective and Interval History:  No fresh blood per rectum.     Objective: Vitals:   12/16/23 0425 12/16/23 0810 12/16/23 1235 12/16/23 1503  BP: 104/62 105/65 109/65 112/70  Pulse: 90 92 91 93  Resp: 14 14 14 16   Temp: 98.5 F (36.9 C) 98.8 F (37.1 C) 98 F (36.7 C) 97.8 F (36.6 C)  TempSrc: Oral Oral  Oral  SpO2: 99% 99% 100% 100%  Weight:      Height:        Intake/Output Summary (Last 24 hours) at 12/16/2023 1732  Last data filed at 12/16/2023 1519 Gross per 24 hour  Intake 316.34 ml  Output 1600 ml  Net -1283.66 ml   Filed Weights   12/10/23 2142 12/11/23 0201  Weight: 113.2 kg 105.9 kg    Examination:   Constitutional: NAD, AAOx3 HEENT: conjunctivae and lids normal, EOMI CV: No cyanosis.    RESP: normal respiratory effort, on RA Neuro: II - XII grossly intact.   Psych: Normal mood and affect.     Data Reviewed: I have personally reviewed labs and imaging studies  Time spent: 35 minutes  Joel Kanner, MD [facility] If 7PM-7AM, please contact night-coverage 12/16/2023, 5:32 PM

## 2023-12-16 NOTE — Progress Notes (Signed)
 Patient received on unit. Patient stable, Patient A&Ox4. No pain expressed at this time. Vital signs obtained and within limits. 3+ BLE edema noted. Bilateral breath sounds clear. Remains on RA without complications.  BS hypoactive. Abdomin tight and distended. Patient denies being uncomfortable. Edema noted in groin. Foley remains intact. Blood continues to run via PIV without complications. No skin issues noted. Client is resting comfortable at this time.

## 2023-12-16 NOTE — Plan of Care (Signed)
   Problem: Education: Goal: Knowledge of General Education information will improve Description Including pain rating scale, medication(s)/side effects and non-pharmacologic comfort measures Outcome: Progressing

## 2023-12-16 NOTE — TOC Progression Note (Signed)
 Transition of Care [facility]) - Progression Note    Patient Details  Name: Joel Harrell MRN: [id number] Date of Birth: 1956/04/29  Transition of Care [facility]) CM/SW Contact  Baird Bombard, RN Phone Number: 12/16/2023, 2:40 PM  Clinical Narrative:    TOC continuing to follow patient's progress throughout discharge planning.   Expected Discharge Plan: Home w Home Health Services Barriers to Discharge: Continued Medical Work up  Expected Discharge Plan and Services     Post Acute Care Choice: Home Health, Durable Medical Equipment Living arrangements for the past 2 months: Apartment                           HH Arranged: RN, PT, OT [facility] Agency: [employer] [facility] Date [facility] Agency Contacted: 12/14/23   Representative spoke with at [facility] Agency: Bartholomew Light   Social Determinants of Health (SDOH) Interventions SDOH Screenings   Food Insecurity: No Food Insecurity (12/11/2023)  Housing: Low Risk  (12/11/2023)  Transportation Needs: No Transportation Needs (12/11/2023)  Utilities: Not At Risk (12/11/2023)  Social Connections: Moderately Integrated (12/11/2023)  Tobacco Use: Low Risk  (12/13/2023)    Readmission Risk Interventions    12/15/2023    8:53 AM  Readmission Risk Prevention Plan  PCP or Specialist Appt within 3-5 Days Complete  HRI or Home Care Consult Complete  Social Work Consult for Recovery Care Planning/Counseling Complete  Palliative Care Screening Not Applicable

## 2023-12-16 NOTE — Plan of Care (Signed)

## 2023-12-17 ENCOUNTER — Other Ambulatory Visit: Payer: Self-pay

## 2023-12-17 ENCOUNTER — Ambulatory Visit

## 2023-12-17 DIAGNOSIS — Z515 Encounter for palliative care: Secondary | ICD-10-CM | POA: Diagnosis not present

## 2023-12-17 DIAGNOSIS — K2921 Alcoholic gastritis with bleeding: Secondary | ICD-10-CM | POA: Diagnosis not present

## 2023-12-17 DIAGNOSIS — K921 Melena: Secondary | ICD-10-CM | POA: Diagnosis not present

## 2023-12-17 LAB — TYPE AND SCREEN
ABO/RH(D): AB POS
Antibody Screen: NEGATIVE
Unit division: 0
Unit division: 0
Unit division: 0

## 2023-12-17 LAB — MAGNESIUM: Magnesium: 1.6 mg/dL — ABNORMAL LOW (ref 1.7–2.4)

## 2023-12-17 LAB — RAD ONC ARIA SESSION SUMMARY
Course Elapsed Days: 2
Plan Fractions Treated to Date: 3
Plan Prescribed Dose Per Fraction: 3 Gy
Plan Total Fractions Prescribed: 10
Plan Total Prescribed Dose: 30 Gy
Reference Point Dosage Given to Date: 9 Gy
Reference Point Session Dosage Given: 3 Gy
Session Number: 3

## 2023-12-17 LAB — CBC
HCT: 27.1 % — ABNORMAL LOW (ref 39.0–52.0)
Hemoglobin: 8.3 g/dL — ABNORMAL LOW (ref 13.0–17.0)
MCH: 23.7 pg — ABNORMAL LOW (ref 26.0–34.0)
MCHC: 30.6 g/dL (ref 30.0–36.0)
MCV: 77.4 fL — ABNORMAL LOW (ref 80.0–100.0)
Platelets: 94 10*3/uL — ABNORMAL LOW (ref 150–400)
RBC: 3.5 MIL/uL — ABNORMAL LOW (ref 4.22–5.81)
RDW: 29.4 % — ABNORMAL HIGH (ref 11.5–15.5)
WBC: 3.2 10*3/uL — ABNORMAL LOW (ref 4.0–10.5)
nRBC: 0 % (ref 0.0–0.2)

## 2023-12-17 LAB — BPAM RBC
Blood Product Expiration Date: 202507062359
Blood Product Expiration Date: 202507062359
Blood Product Expiration Date: 202507082359
ISSUE DATE / TIME: 202506030131
ISSUE DATE / TIME: 202506030656
ISSUE DATE / TIME: 202506051754
Unit Type and Rh: 6200
Unit Type and Rh: 6200
Unit Type and Rh: 6200

## 2023-12-17 LAB — BASIC METABOLIC PANEL WITH GFR
Anion gap: 4 — ABNORMAL LOW (ref 5–15)
BUN: 9 mg/dL (ref 8–23)
CO2: 24 mmol/L (ref 22–32)
Calcium: 7.3 mg/dL — ABNORMAL LOW (ref 8.9–10.3)
Chloride: 106 mmol/L (ref 98–111)
Creatinine, Ser: 0.71 mg/dL (ref 0.61–1.24)
GFR, Estimated: >60 mL/min (ref >60)
Glucose, Bld: 88 mg/dL (ref 70–99)
Potassium: 3.2 mmol/L — ABNORMAL LOW (ref 3.5–5.1)
Sodium: 134 mmol/L — ABNORMAL LOW (ref 135–145)

## 2023-12-17 MED ORDER — MAGNESIUM SULFATE 4 GM/100ML IV SOLN
4.0000 g | Freq: Once | INTRAVENOUS | Status: AC
  Administered 2023-12-17: 4 g via INTRAVENOUS
  Filled 2023-12-17: qty 100

## 2023-12-17 MED ORDER — POTASSIUM CHLORIDE CRYS ER 20 MEQ PO TBCR
40.0000 meq | EXTENDED_RELEASE_TABLET | ORAL | Status: AC
  Administered 2023-12-17 (×2): 40 meq via ORAL
  Filled 2023-12-17 (×2): qty 2

## 2023-12-17 NOTE — Progress Notes (Signed)
 PROGRESS NOTE    [place]  ZOX:[id number] DOB: Aug 30, 1955 DOA: 12/10/2023 PCP: Patient, No Pcp Per  [id number]  LOS: 7 days   Brief hospital course:   Assessment & Plan: Latham Kinzler is a 68 y.o. male with medical history significant for asthma, CHF, hypertension, alcoholic liver cirrhosis, and COPD, who presented to the emergency room with acute onset of bilateral lower extremity edema as well as abdominal distention with scrotal edema worsening over the last week. He admitted to drinking 12 pack beer per week although his family stated that he also drinks liquor.  Upon arriving to hospital, hemoglobin was 4.2.  Patient states that he had a black stool a month ago, since then, he has been having brown stools every day. Due to liver cirrhosis, patient was given Protonix , octreotide, and PRBC.  GI consult obtained.  EGD showed portal hypertension gastropathy, esophageal varices.  No active bleeding. Colonoscopy was performed 6/2, identified a mass in the sigmoid colon, with oozing blood.  Biopsy results came back with colon cancer.  Radiation oncology consulted.  Acute lower GI bleeding secondary to colon cancer.   Patient came in with hemoglobin of 4.3, initially received 4 units of PRBC and IV iron. Patient had EGD performed on 6/1, showed esophageal varices and portal hypertensive gastropathy, but did not show any active bleeding.   However, colonoscopy showed sigmoid mass with oozing of blood.  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   Patient had a worsening rectal bleeding after biopsy, CT angiogram initially showed active bleeding, IR performed mesentery angiogram later, did not show any active bleeding. Patient required 2 more units of blood transfusion.  Dr. Jeane Miguel contacted [facility], tried to transfer to surgery service.  However, transfer was declined, they do not believe patient can survive surgery.  They recommended palliative care versus hospice. --oncology and  radiation oncology consulted --started on radiation  --cont radiation tx in house   Acute blood loss anemia secondary to GI bleed. Iron deficient anemia.   --7u pRBC so far --monitor Hgb and transfuse to keep Hgb >7  Pancytopenia secondary to liver cirrhosis.  Alcoholic cirrhosis of liver with ascites (HCC) Portal hypertension gastropathy.  Esophageal varices. Alcohol abuse. --paracentesis on 6/3 with 3L removed --cont thiamine  and folic acid   Anasarca  --severe penile and scrotal swelling --cont IV lasix 40 BID  Hyponatremia   Hypokalemia. Hypomagnesemia. Hypophosphatemia. --monitor and supplement PRN   Urinary retention secondary to benign prostate hypertrophy and scrotal edema. Foley catheter was anchored by urology.  Continue to follow. Will keep Foley catheter at discharge, patient will be followed with urology as outpatient.   Essential hypertension --hold amlodipine  and Lisinopril  while diuresing   Chronic obstructive pulmonary disease (COPD) (HCC) --stable    DVT prophylaxis: Lovenox SQ Code Status: Full code  Family Communication:  Level of care: Med-Surg Dispo:   The patient is from: home Anticipated d/c is to: home Anticipated d/c date is: >3 days   Subjective and Interval History:  Pt reported pain improved in his scrotal areas.  Reported good urine output.   Objective: Vitals:   12/17/23 0418 12/17/23 0731 12/17/23 1047 12/17/23 1504  BP: 111/83 118/66  128/80  Pulse: 95 92  96  Resp: 20 (!) 22  20  Temp: 98.5 F (36.9 C) 99.1 F (37.3 C)  98.2 F (36.8 C)  TempSrc: Oral Oral  Oral  SpO2: 99% 99% 97% 100%  Weight:      Height:  Intake/Output Summary (Last 24 hours) at 12/17/2023 1834 Last data filed at 12/17/2023 1828 Gross per 24 hour  Intake 804 ml  Output 2400 ml  Net -1596 ml   Filed Weights   12/10/23 2142 12/11/23 0201  Weight: 113.2 kg 105.9 kg    Examination:   Constitutional: NAD, AAOx3 HEENT: conjunctivae and  lids normal, EOMI CV: No cyanosis.   RESP: normal respiratory effort, on RA Extremities: tense edema in BLE Neuro: II - XII grossly intact.   Psych: Normal mood and affect.   Penile and scrotal swelling   Data Reviewed: I have personally reviewed labs and imaging studies  Time spent: 35 minutes  Garrison Kanner, MD [facility] If 7PM-7AM, please contact night-coverage 12/17/2023, 6:34 PM

## 2023-12-17 NOTE — Anesthesia Postprocedure Evaluation (Signed)
 Anesthesia Post Note  Patient: Joel Harrell  Procedure(s) Performed: COLONOSCOPY POLYPECTOMY, INTESTINE CONTROL OF HEMORRHAGE, GI TRACT, ENDOSCOPIC, BY CLIPPING OR OVERSEWING  Patient location during evaluation: Endoscopy Anesthesia Type: General Level of consciousness: awake and alert Pain management: pain level controlled Vital Signs Assessment: post-procedure vital signs reviewed and stable Respiratory status: spontaneous breathing, nonlabored ventilation, respiratory function stable and patient connected to nasal cannula oxygen Cardiovascular status: blood pressure returned to baseline and stable Postop Assessment: no apparent nausea or vomiting Anesthetic complications: no   No notable events documented.   Last Vitals:  Vitals:   12/17/23 0731 12/17/23 1047  BP: 118/66   Pulse: 92   Resp: (!) 22   Temp: 37.3 C   SpO2: 99% 97%    Last Pain:  Vitals:   12/17/23 0828  TempSrc:   PainSc: 0-No pain                 Vanice Genre

## 2023-12-17 NOTE — Plan of Care (Signed)
  Problem: Clinical Measurements: Goal: Ability to maintain clinical measurements within normal limits will improve Outcome: Progressing Goal: Will remain free from infection Outcome: Progressing Goal: Diagnostic test results will improve Outcome: Progressing Goal: Respiratory complications will improve Outcome: Progressing Goal: Cardiovascular complication will be avoided Outcome: Progressing   Problem: Activity: Goal: Risk for activity intolerance will decrease Outcome: Progressing   Problem: Nutrition: Goal: Adequate nutrition will be maintained Outcome: Progressing   Problem: Coping: Goal: Level of anxiety will decrease Outcome: Progressing   Problem: Elimination: Goal: Will not experience complications related to bowel motility Outcome: Progressing Goal: Will not experience complications related to urinary retention Outcome: Progressing   Problem: Pain Managment: Goal: General experience of comfort will improve and/or be controlled Outcome: Progressing   Problem: Safety: Goal: Ability to remain free from injury will improve Outcome: Progressing   Problem: Skin Integrity: Goal: Risk for impaired skin integrity will decrease Outcome: Progressing   Problem: Education: Goal: Understanding of CV disease, CV risk reduction, and recovery process will improve Outcome: Progressing Goal: Individualized Educational Video(s) Outcome: Progressing   Problem: Activity: Goal: Ability to return to baseline activity level will improve Outcome: Progressing

## 2023-12-17 NOTE — Progress Notes (Signed)
 Physical Therapy Treatment Patient Details Name: Joel Harrell MRN: [id number] DOB: 04/29/1956 Today's Date: 12/17/2023   History of Present Illness Pt is a 68 yo male admitted for potential GI bleed, LE edema. PMH of asthma, CHF, HTN, etoh.    PT Comments  Pt was long sitting in recliner upon arrival. He is agreeable to session and remains cooperative throughout. Started session but getting OOB (no assist) and ambulating a lap in hallway with use for RW. Progressed to ambulating a lap without AD however pushing IV pole for single UE support. Overall pt tolerated session well. Encouraged pt to increase OOB activity and continue to perform bed level exercises throughout the day. Acute PT will continue to follow per current POC.    If plan is discharge home, recommend the following: A little help with bathing/dressing/bathroom;Assistance with cooking/housework;A little help with walking and/or transfers;Assist for transportation;Help with stairs or ramp for entrance     Equipment Recommendations  Rolling walker (2 wheels);BSC/3in1       Precautions / Restrictions Precautions Precautions: Fall Recall of Precautions/Restrictions: Intact Restrictions Weight Bearing Restrictions Per Provider Order: No     Mobility  Bed Mobility Overal bed mobility: Modified Independent Bed Mobility: Supine to Sit, Sit to Supine  Supine to sit: Modified independent (Device/Increase time) Sit to supine: Modified independent (Device/Increase time) General bed mobility comments: pt able to get LEs in and out of bed, used rails to scoot up in bed and ultimately showed good tolerance with mobility, etc    Transfers Overall transfer level: Modified independent Equipment used: 1 person hand held assist, Rolling walker (2 wheels) Transfers: Sit to/from Stand, Bed to chair/wheelchair/BSC Sit to Stand: Supervision   Ambulation/Gait Ambulation/Gait assistance: Supervision Gait Distance (Feet): 400 Feet Assistive  device: IV Pole Gait Pattern/deviations: Step-through pattern Gait velocity: decreased  General Gait Details: Pt was able to ambulate 400 ft with RW x 200 then single UE support on IV pole    Balance Overall balance assessment: Needs assistance Sitting-balance support: Feet supported Sitting balance-[name]: Good     Standing balance support: Reliant on assistive device for balance, Bilateral upper extremity supported Standing balance-[name]: Good       Communication Communication Communication: No apparent difficulties  Cognition Arousal: Alert Behavior During Therapy: WFL for tasks assessed/performed   PT - Cognitive impairments: No apparent impairments      PT - Cognition Comments: pleasant and agreeable to session        Cueing Cueing Techniques: Verbal cues         Pertinent Vitals/Pain Pain Assessment Pain Assessment: No/denies pain Pain Score: 0-No pain Pain Location: groin swelling Pain Descriptors / Indicators: Sore Pain Intervention(s): Limited activity within patient's tolerance, Monitored during session, Premedicated before session, Repositioned     PT Goals (current goals can now be found in the care plan section) Acute Rehab PT Goals Patient Stated Goal: to be less swollen Progress towards PT goals: Progressing toward goals    Frequency    Min 2X/week       AM-PAC PT "6 Clicks" Mobility   Outcome Measure  Help needed turning from your back to your side while in a flat bed without using bedrails?: None Help needed moving from lying on your back to sitting on the side of a flat bed without using bedrails?: None Help needed moving to and from a bed to a chair (including a wheelchair)?: A Little Help needed standing up from a chair using your arms (e.g., wheelchair or  bedside chair)?: A Little Help needed to walk in hospital room?: A Little Help needed climbing 3-5 steps with a railing? : A Little 6 Click Score: 20    End of  Session Equipment Utilized During Treatment: Gait belt Activity Tolerance: Patient tolerated treatment well Patient left: with call bell/phone within reach;with bed alarm set;with family/visitor present Nurse Communication: Mobility status PT Visit Diagnosis: Other abnormalities of gait and mobility (R26.89);Difficulty in walking, not elsewhere classified (R26.2);Muscle weakness (generalized) (M62.81)     Time: 2956-2130 PT Time Calculation (min) (ACUTE ONLY): 16 min  Charges:    $Gait Training: 8-22 mins PT General Charges $$ ACUTE PT VISIT: 1 Visit                    Chester Costa PTA 12/17/23, 4:10 PM

## 2023-12-17 NOTE — Plan of Care (Signed)

## 2023-12-17 NOTE — Progress Notes (Signed)
 Palliative Medicine [facility] at [facility] [phone] [phone]   Name: Joel Harrell Date: 12/17/2023 MRN: [id number]  DOB: 14-Dec-1955  Patient Care Team: Patient, No Pcp Per as PCP - General (General Practice)    REASON FOR CONSULTATION: Joel Harrell is a 68 y.o. male with multiple medical problems including hypertension, COPD, alcoholic cirrhosis with ascites.  Patient was admitted to hospital with GI bleed and underwent EGD on 12/12/2023 revealing portal hypertensive gastropathy.  Colonoscopy on 12/13/2023 revealed a nonobstructing mass in the sigmoid colon, which was oozing.  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  Patient was deemed not a surgical candidate.  Palliative care was consulted to address goals.     CODE STATUS: Full code  PAST MEDICAL HISTORY: Past Medical History:  Diagnosis Date   Asthma    CHF (congestive heart failure) (HCC)    Hypertension     PAST SURGICAL HISTORY:  Past Surgical History:  Procedure Laterality Date   COLONOSCOPY N/A 12/13/2023   Procedure: COLONOSCOPY;  Surgeon: Marnee Sink, MD;  Location: [facility] ENDOSCOPY;  Service: Endoscopy;  Laterality: N/A;   ESOPHAGOGASTRODUODENOSCOPY N/A 12/12/2023   Procedure: EGD (ESOPHAGOGASTRODUODENOSCOPY);  Surgeon: Quintin Buckle, DO;  Location: [facility] ENDOSCOPY;  Service: Gastroenterology;  Laterality: N/A;   HEMOSTASIS CLIP PLACEMENT  12/13/2023   Procedure: CONTROL OF HEMORRHAGE, GI TRACT, ENDOSCOPIC, BY CLIPPING OR OVERSEWING;  Surgeon: Marnee Sink, MD;  Location: [facility] ENDOSCOPY;  Service: Endoscopy;;   IR ANGIOGRAM PELVIS SELECTIVE OR SUPRASELECTIVE  12/13/2023   POLYPECTOMY  12/13/2023   Procedure: POLYPECTOMY, INTESTINE;  Surgeon: Marnee Sink, MD;  Location: [facility] ENDOSCOPY;  Service: Endoscopy;;    HEMATOLOGY/ONCOLOGY HISTORY:  Oncology History   No history exists.    ALLERGIES:  has no known allergies.  MEDICATIONS:  Current  Facility-Administered Medications  Medication Dose Route Frequency Provider Last Rate Last Admin   acetaminophen  (TYLENOL ) tablet 650 mg  650 mg Oral Q6H PRN Mansy, Jan A, MD   650 mg at 12/16/23 2132   Or   acetaminophen  (TYLENOL ) suppository 650 mg  650 mg Rectal Q6H PRN Mansy, Jan A, MD       albuterol  (PROVENTIL ) (2.5 MG/3ML) 0.083% nebulizer solution 3 mL  3 mL Nebulization Q4H PRN Donaciano Frizzle, MD   3 mL at 12/17/23 1047   bisacodyl (DULCOLAX) EC tablet 10 mg  10 mg Oral BID PRN Mansy, Jan A, MD   10 mg at 12/13/23 0300   Chlorhexidine Gluconate Cloth 2 % PADS 6 each  6 each Topical Daily Donaciano Frizzle, MD   6 each at 12/17/23 1037   enoxaparin (LOVENOX) injection 40 mg  40 mg Subcutaneous Q24H Garrison Kanner, MD   40 mg at 12/16/23 2132   folic acid  (FOLVITE ) tablet 1 mg  1 mg Oral Daily Mansy, Jan A, MD   1 mg at 12/17/23 6213   furosemide (LASIX) injection 40 mg  40 mg Intravenous BID Garrison Kanner, MD   40 mg at 12/17/23 0865   magnesium  hydroxide (MILK OF MAGNESIA) suspension 30 mL  30 mL Oral Daily PRN Mansy, Jan A, MD       magnesium  sulfate IVPB 4 g 100 mL  4 g Intravenous Once Garrison Kanner, MD 50 mL/hr at 12/17/23 1338 4 g at 12/17/23 1338   montelukast  (SINGULAIR ) tablet 10 mg  10 mg Oral QHS Mansy, Jan A, MD   10 mg at 12/16/23 2132   ondansetron  (ZOFRAN ) tablet 4 mg  4  mg Oral Q6H PRN Mansy, Jan A, MD       Or   ondansetron  (ZOFRAN ) injection 4 mg  4 mg Intravenous Q6H PRN Mansy, Jan A, MD       polyethylene glycol-electrolytes (NuLYTELY ) solution 2,000 mL  2,000 mL Oral Once PRN Russo, Steven Michael, DO       potassium chloride  SA (KLOR-CON  M) CR tablet 40 mEq  40 mEq Oral Q4H Garrison Kanner, MD   40 mEq at 12/17/23 1034   sodium phosphate  (FLEET) enema 1 enema  1 enema Rectal Daily PRN Mansy, Jan A, MD   1 enema at 12/13/23 0300   thiamine  (VITAMIN B1) tablet 100 mg  100 mg Oral Daily Mansy, Jan A, MD   100 mg at 12/17/23 4098   traZODone (DESYREL) tablet 25 mg  25 mg Oral QHS PRN Mansy, Jan  A, MD        VITAL SIGNS: BP 118/66 (BP Location: Left Arm)   Pulse 92   Temp 99.1 F (37.3 C) (Oral)   Resp (!) 22   Ht 6\' 3"  (1.905 m)   Wt 233 lb 8 oz (105.9 kg)   SpO2 97%   BMI 29.19 kg/m  Filed Weights   12/10/23 2142 12/11/23 0201  Weight: 249 lb 8 oz (113.2 kg) 233 lb 8 oz (105.9 kg)    Estimated body mass index is 29.19 kg/m as calculated from the following:   Height as of this encounter: 6\' 3"  (1.905 m).   Weight as of this encounter: 233 lb 8 oz (105.9 kg).  LABS: CBC:    Component Value Date/Time   WBC 3.2 (L) 12/17/2023 0456   HGB 8.3 (L) 12/17/2023 0456   HGB 16.4 01/21/2014 0414   HCT 27.1 (L) 12/17/2023 0456   HCT 48.9 01/21/2014 0414   PLT 94 (L) 12/17/2023 0456   PLT 165 01/21/2014 0414   MCV 77.4 (L) 12/17/2023 0456   MCV 98 01/21/2014 0414   NEUTROABS 1.1 (L) 11/21/2022 1841   NEUTROABS 12.0 (H) 01/21/2014 0414   LYMPHSABS 2.2 11/21/2022 1841   LYMPHSABS 1.0 01/21/2014 0414   MONOABS 0.2 11/21/2022 1841   MONOABS 0.4 01/21/2014 0414   EOSABS 0.1 11/21/2022 1841   EOSABS 0.0 01/21/2014 0414   BASOSABS 0.1 11/21/2022 1841   BASOSABS 0.0 01/21/2014 0414   Comprehensive Metabolic Panel:    Component Value Date/Time   NA 134 (L) 12/17/2023 0456   NA 136 01/21/2014 0414   K 3.2 (L) 12/17/2023 0456   K 3.9 01/21/2014 0414   CL 106 12/17/2023 0456   CL 103 01/21/2014 0414   CO2 24 12/17/2023 0456   CO2 25 01/21/2014 0414   BUN 9 12/17/2023 0456   BUN 19 (H) 01/21/2014 0414   CREATININE 0.71 12/17/2023 0456   CREATININE 1.06 01/24/2014 0357   GLUCOSE 88 12/17/2023 0456   GLUCOSE 126 (H) 01/21/2014 0414   CALCIUM 7.3 (L) 12/17/2023 0456   CALCIUM 8.7 01/21/2014 0414   AST 22 12/14/2023 0529   AST 161 (H) 01/20/2014 0220   ALT 10 12/14/2023 0529   ALT 197 (H) 01/20/2014 0220   ALKPHOS 54 12/14/2023 0529   ALKPHOS 70 01/20/2014 0220   BILITOT 3.1 (H) 12/14/2023 0529   BILITOT 0.9 01/20/2014 0220   PROT 6.4 (L) 12/14/2023 0529   PROT  9.2 (H) 01/20/2014 0220   ALBUMIN 2.2 (L) 12/14/2023 0529   ALBUMIN 3.6 01/20/2014 0220    RADIOGRAPHIC STUDIES: CT CHEST WO CONTRAST  Result Date: 12/16/2023 CLINICAL DATA:  Staging of colon cancer.  * Tracking Code: BO * EXAM: CT CHEST WITHOUT CONTRAST TECHNIQUE: Multidetector CT imaging of the chest was performed following the standard protocol without IV contrast. RADIATION DOSE REDUCTION: This exam was performed according to the departmental dose-optimization program which includes automated exposure control, adjustment of the mA and/or kV according to patient size and/or use of iterative reconstruction technique. COMPARISON:  CTA of the abdomen and pelvis of 12/13/2023. Remote chest CT of 10/06/2010 FINDINGS: Cardiovascular: Aortic atherosclerosis. Tortuous thoracic aorta. Mild cardiomegaly, without pericardial effusion. Three vessel coronary artery calcification. Pulmonary artery enlargement, outflow tract 3.2 cm. Mediastinum/Nodes: No supraclavicular adenopathy. No mediastinal or hilar adenopathy, given limitations of unenhanced CT. Lungs/Pleura: Small bilateral pleural effusions, similar to the recent abdominal CT. Bibasilar dependent atelectasis. Minimal motion degradation. No pulmonary metastasis. Upper Abdomen: Deferred to recent diagnostic CT. Cirrhosis. Cholelithiasis. Upper abdominal ascites. Musculoskeletal: Anasarca. Mild convex right thoracic spine curvature. IMPRESSION: 1. Mildly motion degraded exam. Given this limitation, no metastatic disease in the chest. 2. Ascites, bilateral pleural effusions, and anasarca, suggesting fluid overload. 3. Pulmonary artery enlargement suggests pulmonary arterial hypertension. 4. Coronary artery atherosclerosis. Aortic Atherosclerosis (ICD10-I70.0). Electronically Signed   By: Lore Rode M.D.   On: 12/16/2023 18:03   US  Paracentesis Result Date: 12/14/2023 INDICATION: 68 year old male with a history of alcoholic liver cirrhosis who presented to the ED  with lower extremity edema and abdominal distention. Request for diagnostic and therapeutic paracentesis. EXAM: ULTRASOUND GUIDED left PARACENTESIS MEDICATIONS: 1% lidocaine , 7 mL. COMPLICATIONS: None immediate. PROCEDURE: Informed written consent was obtained from the patient after a discussion of the risks, benefits and alternatives to treatment. A timeout was performed prior to the initiation of the procedure. Initial ultrasound scanning demonstrates a large amount of ascites within the left lower abdominal quadrant. The left lower abdomen was prepped and draped in the usual sterile fashion. 1% lidocaine  was used for local anesthesia. Following this, a 19 gauge, 7-cm, Yueh catheter was introduced. An ultrasound image was saved for documentation purposes. The paracentesis was performed. The catheter was removed and a dressing was applied. The patient tolerated the procedure well without immediate post procedural complication. Patient received post-procedure intravenous albumin; see nursing notes for details. FINDINGS: A total of approximately 3 L of amber fluid was removed. Samples were sent to the laboratory as requested by the clinical team. IMPRESSION: Successful ultrasound-guided paracentesis yielding 3 liters of peritoneal fluid. PLAN: If the patient eventually requires >/=2 paracenteses in a 30 day period, candidacy for formal evaluation by the [facility] will be assessed. Procedure performed by: Estella Helling, PA-C under the supervision of Dr. Irine Manning Electronically Signed   By: Elene Griffes M.D.   On: 12/14/2023 12:53   IR Angiogram Pelvis Selective Or Supraselective Result Date: 12/14/2023 CLINICAL DATA:  Active GI bleed in the distal transverse colon near an endoscopic clip on recent CTA EXAM: MESENTERIC SELECTIVE ARTERIOGRAPHY ANESTHESIA/SEDATION: Intravenous Fentanyl 25mcg administered by RN during a total moderate (conscious) sedation time of 48  minutes; the patient's level of consciousness and physiological / cardiorespiratory status were monitored continuously by radiology RN under my direct supervision. MEDICATIONS: Lidocaine  1% subcutaneous CONTRAST:  85mL OMNIPAQUE IOHEXOL 300 MG/ML  SOLN PROCEDURE: The procedure, risks (including but not limited to bleeding, infection, organ damage ), benefits, and alternatives were explained to the patient and spouse. Questions regarding the procedure were encouraged and answered. The patient understands and consents to the procedure. Right femoral  region prepped and draped in usual sterile fashion. Maximal barrier sterile technique was utilized including caps, mask, sterile gowns, sterile gloves, sterile drape, hand hygiene and skin antiseptic. The right common femoral artery was localized under ultrasound. Under real-time ultrasound guidance, the vessel was accessed with a 21-gauge micropuncture needle, exchanged over a 018 guidewire for a transitional dilator, through which a 035 guidewire was advanced. Over this, a 5 [place] vascular sheath was placed, through which a 5 French rim catheter was advanced and used to selectively catheterize the inferior mesenteric artery for selective arteriography in multiple projections. The catheter was then exchanged over the guidewire for a C2 catheter, then utilized to selectively catheterize the superior mesenteric artery for selective arteriography in multiple projections. The catheter and sheath were removed and hemostasis achieved with the aid of the Celt device under ultrasound guidance. The patient tolerated the procedure well. COMPLICATIONS: None immediate FINDINGS: No evidence of active extravasation, early draining vein, AVM, or other lesion to suggest a site or etiology of the patient's GI bleeding. Endoscopic clip in the distal transverse colon, corresponding to findings from recent CTA. Venous phase confirms patency of the IMV and portal venous system. IMPRESSION:  1. Negative 2-vessel mesenteric arteriogram. No evidence of active extravasation or other focal lesion to suggest etiology of GI bleed. Electronically Signed   By: Nicoletta Barrier M.D.   On: 12/14/2023 02:08   CT ANGIO GI BLEED Result Date: 12/13/2023 CLINICAL DATA:  Lower GI bleeding * Tracking Code: BO * EXAM: CTA ABDOMEN AND PELVIS WITHOUT AND WITH CONTRAST TECHNIQUE: Multidetector CT imaging of the abdomen and pelvis was performed using the standard protocol during bolus administration of intravenous contrast. Multiplanar reconstructed images and MIPs were obtained and reviewed to evaluate the vascular anatomy. RADIATION DOSE REDUCTION: This exam was performed according to the departmental dose-optimization program which includes automated exposure control, adjustment of the mA and/or kV according to patient size and/or use of iterative reconstruction technique. CONTRAST:  100mL OMNIPAQUE IOHEXOL 350 MG/ML SOLN COMPARISON:  None Available. FINDINGS: VASCULAR Normal contour and caliber of the abdominal aorta. No evidence of aneurysm, dissection, or other acute aortic pathology. 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 Mild aortic atherosclerosis. Review of the MIP images confirms the above findings. NON-VASCULAR Lower Chest: Moderate right, small left pleural effusions and associated atelectasis or consolidation. Three-vessel coronary artery calcifications. Hepatobiliary: No solid liver abnormality is seen. Coarse contour of the liver. Gallstones. No biliary ductal dilatation. Pancreas: Unremarkable. No pancreatic ductal dilatation or surrounding inflammatory changes. Spleen: Splenomegaly, maximum span 15.5 cm. Adrenals/Urinary Tract: Adrenal glands are unremarkable. Kidneys are normal, without renal calculi, solid lesion, or hydronephrosis. Foley catheter in the bladder. Stomach/Bowel: Small gastroesophageal varices. The colon  is filled with fluid and heterogeneously hyperdense material to the rectum. Endoluminal clip within the redundant distal transverse colon with arterial extravasation arising in this vicinity (series 18, image 107). Contrast enhancing endoluminal mass of the very redundant mid sigmoid colon measuring 2.4 x 2.1 cm (series 18, image 126) Lymphatic: No enlarged abdominal or pelvic lymph nodes. Reproductive: No mass or other significant abnormality. Other: No abdominal wall hernia. Severe anasarca. Moderate volume ascites throughout the abdomen pelvis. Musculoskeletal: No acute osseous findings. IMPRESSION: 1. Endoluminal clip within the redundant distal transverse colon with arterial extravasation arising in this vicinity, consistent with active GI bleeding. 2. Contrast enhancing endoluminal mass of the very redundant mid sigmoid colon measuring 2.4 x 2.1  cm, highly concerning for primary colon malignancy. 3. The colon is filled with fluid and heterogeneously hyperdense material to the rectum, consistent with blood products. 4. Normal contour and caliber of the abdominal aorta. No evidence of aneurysm, dissection, or other acute aortic pathology. Mild aortic atherosclerosis. 5. Cirrhosis and splenomegaly.  Small gastroesophageal varices. 6. Pleural effusions, severe anasarca, and ascites. 7. Cholelithiasis.  Coronary artery disease. These results will be called to the ordering clinician or representative by the Radiologist Assistant, and communication documented in the PACS or [employer]. Aortic Atherosclerosis (ICD10-I70.0). Electronically Signed   By: Fredricka Jenny M.D.   On: 12/13/2023 20:50   US  Abdomen Limited RUQ (LIVER/GB) Result Date: 12/12/2023 CLINICAL DATA:  [id number] Ascites due to alcoholic cirrhosis (HCC) [id number] EXAM: ULTRASOUND ABDOMEN LIMITED RIGHT UPPER QUADRANT COMPARISON:  Nov 22, 2022 FINDINGS: Gallbladder: Multiple small gallstones. No wall thickening or pericholecystic fluid. No sonographic  Murphy's sign noted by sonographer. Common bile duct: Diameter: 3 mm Liver: Nodular contour with coarsened echogenicity. No focal lesion identified. No intrahepatic biliary ductal dilation. Portal vein is patent on color Doppler imaging with normal direction of blood flow towards the liver. Other: Moderate volume ascites.  Splenomegaly. IMPRESSION: 1. Cholecystolithiasis.  No changes of acute cholecystitis. 2. Cirrhotic morphology of the liver with sequelae of portal hypertension, including splenomegaly and moderate volume ascites. Continued biannual HCC surveillance recommended. Electronically Signed   By: Rance Burrows M.D.   On: 12/12/2023 16:05   DG Chest Portable 1 View Result Date: 12/10/2023 CLINICAL DATA:  Chest pain EXAM: PORTABLE CHEST 1 VIEW COMPARISON:  None Available. FINDINGS: Mild right basilar atelectasis. Lungs are otherwise clear. No pneumothorax or pleural effusion. Cardiac size within normal limits. Pulmonary vascularity is normal. Osseous structures are age-appropriate. No acute bone abnormality. IMPRESSION: No active disease. Electronically Signed   By: Worthy Heads M.D.   On: 12/10/2023 22:20    PERFORMANCE STATUS (ECOG) : 2 - Symptomatic, <50% confined to bed  Review of Systems Unless otherwise noted, a complete review of systems is negative.  Physical Exam General: Frail appearing Pulmonary: Unlabored Extremities: edema Skin: no rashes Neurological: Weakness but otherwise nonfocal  IMPRESSION: Follow-up visit.  Patient receiving XRT to colon mass.  He denies any recurrent dark or tarry stools.  Met with patient and wife to discuss goals.  Both verbalized understanding that cancer may have limited treatment options in light of his other advanced comorbidities.  They would want any and all treatments available.  Likely will benefit from outpatient oncology follow-up.  I can also plan to follow him in the clinic and continue addressing goals.  Again discussed CODE  STATUS.  Patient and wife confirmed a desire to remain a full code despite explanation of the probable futility with such care.  PLAN: - Continue current scope of treatment - Full code - Outpatient follow-up  Case and plan discussed with Dr. Randy Buttery  Time Total: 40 minutes  Visit consisted of counseling and education dealing with the complex and emotionally intense issues of symptom management and palliative care in the setting of serious and potentially life-threatening illness.Greater than 50%  of this time was spent counseling and coordinating care related to the above assessment and plan.  Signed by: Gerilyn Kobus, PhD, NP-C

## 2023-12-18 DIAGNOSIS — K921 Melena: Secondary | ICD-10-CM | POA: Diagnosis not present

## 2023-12-18 LAB — POTASSIUM: Potassium: 3.3 mmol/L — ABNORMAL LOW (ref 3.5–5.1)

## 2023-12-18 LAB — MAGNESIUM: Magnesium: 1.8 mg/dL (ref 1.7–2.4)

## 2023-12-18 MED ORDER — MAGNESIUM SULFATE 4 GM/100ML IV SOLN
4.0000 g | Freq: Once | INTRAVENOUS | Status: AC
  Administered 2023-12-18: 4 g via INTRAVENOUS
  Filled 2023-12-18: qty 100

## 2023-12-18 MED ORDER — FUROSEMIDE 40 MG PO TABS
40.0000 mg | ORAL_TABLET | Freq: Two times a day (BID) | ORAL | Status: DC
  Administered 2023-12-19: 40 mg via ORAL
  Filled 2023-12-18: qty 1

## 2023-12-18 MED ORDER — POTASSIUM CHLORIDE CRYS ER 20 MEQ PO TBCR
40.0000 meq | EXTENDED_RELEASE_TABLET | ORAL | Status: AC
  Administered 2023-12-18 (×2): 40 meq via ORAL
  Filled 2023-12-18 (×2): qty 2

## 2023-12-18 MED ORDER — ENOXAPARIN SODIUM 40 MG/0.4ML IJ SOSY
40.0000 mg | PREFILLED_SYRINGE | INTRAMUSCULAR | Status: DC
  Administered 2023-12-18 – 2023-12-21 (×4): 40 mg via SUBCUTANEOUS
  Filled 2023-12-18 (×4): qty 0.4

## 2023-12-18 NOTE — Plan of Care (Signed)

## 2023-12-18 NOTE — Progress Notes (Signed)
 PROGRESS NOTE    Caymen Dubray  ZOX:[id number] DOB: Apr 02, 1956 DOA: 12/10/2023 PCP: Patient, No Pcp Per  [id number]  LOS: 8 days   Brief hospital course:   Assessment & Plan: Chey Cho is a 68 y.o. male with medical history significant for asthma, CHF, hypertension, alcoholic liver cirrhosis, and COPD, who presented to the emergency room with acute onset of bilateral lower extremity edema as well as abdominal distention with scrotal edema worsening over the last week. He admitted to drinking 12 pack beer per week although his family stated that he also drinks liquor.  Upon arriving to hospital, hemoglobin was 4.2.  Patient states that he had a black stool a month ago, since then, he has been having brown stools every day. Due to liver cirrhosis, patient was given Protonix , octreotide , and PRBC.  GI consult obtained.  EGD showed portal hypertension gastropathy, esophageal varices.  No active bleeding. Colonoscopy was performed 6/2, identified a mass in the sigmoid colon, with oozing blood.  Biopsy results came back with colon cancer.  Radiation oncology consulted.  Acute lower GI bleeding secondary to colon cancer.   Patient came in with hemoglobin of 4.3, initially received 4 units of PRBC and IV iron . Patient had EGD performed on 6/1, showed esophageal varices and portal hypertensive gastropathy, but did not show any active bleeding.   However, colonoscopy showed sigmoid mass with oozing of blood.  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   Patient had a worsening rectal bleeding after biopsy, CT angiogram initially showed active bleeding, IR performed mesentery angiogram later, did not show any active bleeding. Patient required 2 more units of blood transfusion.  Dr. Jeane Miguel contacted [facility], tried to transfer to surgery service.  However, transfer was declined, they do not believe patient can survive surgery.  They recommended palliative care versus hospice. --oncology and  radiation oncology consulted --started on radiation  --cont radiation tx in house per rad onc --outpatient f/u with med onc   Acute blood loss anemia secondary to GI bleed. Iron  deficient anemia.   --7u pRBC so far --monitor Hgb and transfuse to keep Hgb >7  Pancytopenia secondary to liver cirrhosis.  Alcoholic cirrhosis of liver with ascites (HCC) Portal hypertension gastropathy.  Esophageal varices. Alcohol abuse. --paracentesis on 6/3 with 3L removed --cont thiamine  and folic acid  --outpatient f/u with GI  Anasarca  --severe penile and scrotal swelling --cont IV lasix  40 BID  Hyponatremia   Hypokalemia. Hypomagnesemia. --due to diuresis --monitor and supplement PRN  Hypophosphatemia. --monitor and supplement PRN   Urinary retention secondary to benign prostate hypertrophy and scrotal edema. Foley catheter was anchored by urology.  Continue to follow. Will keep Foley catheter at discharge, patient will be followed with urology as outpatient.   Essential hypertension --hold amlodipine  and Lisinopril  while diuresing   Chronic obstructive pulmonary disease (COPD) (HCC) --stable    DVT prophylaxis: Lovenox  SQ Code Status: Full code  Family Communication:  Level of care: Med-Surg Dispo:   The patient is from: home Anticipated d/c is to: home Anticipated d/c date is: >3 days   Subjective and Interval History:  Pt reported pain improved in his scrotal areas.  Reported good urine output.   Objective: Vitals:   12/17/23 2039 12/18/23 0342 12/18/23 0912 12/18/23 1601  BP: 123/73 109/65 128/72 126/74  Pulse: 93 100 (!) 103 91  Resp: 20 20 17 17   Temp: 97.6 F (36.4 C) 98.2 F (36.8 C) 98.7 F (37.1 C) 98.2 F (36.8 C)  TempSrc: Oral Oral  Oral Oral  SpO2: 100% 98% 99% 100%  Weight:      Height:        Intake/Output Summary (Last 24 hours) at 12/18/2023 1830 Last data filed at 12/18/2023 1738 Gross per 24 hour  Intake 360 ml  Output 2750 ml  Net -2390  ml   Filed Weights   12/10/23 2142 12/11/23 0201  Weight: 113.2 kg 105.9 kg    Examination:   Constitutional: NAD, AAOx3 HEENT: conjunctivae and lids normal, EOMI CV: No cyanosis.   RESP: normal respiratory effort, on RA Extremities: tense edema in BLE Neuro: II - XII grossly intact.   Psych: Normal mood and affect.   Penile and scrotal swelling   Data Reviewed: I have personally reviewed labs and imaging studies  Time spent: 35 minutes  Garrison Kanner, MD [facility] If 7PM-7AM, please contact night-coverage 12/18/2023, 6:30 PM

## 2023-12-18 NOTE — Plan of Care (Signed)

## 2023-12-19 DIAGNOSIS — K921 Melena: Secondary | ICD-10-CM | POA: Diagnosis not present

## 2023-12-19 LAB — AEROBIC/ANAEROBIC CULTURE W GRAM STAIN (SURGICAL/DEEP WOUND)
Culture: NO GROWTH
Gram Stain: NONE SEEN

## 2023-12-19 LAB — BASIC METABOLIC PANEL WITH GFR
Anion gap: 6 (ref 5–15)
BUN: 9 mg/dL (ref 8–23)
CO2: 22 mmol/L (ref 22–32)
Calcium: 7.6 mg/dL — ABNORMAL LOW (ref 8.9–10.3)
Chloride: 103 mmol/L (ref 98–111)
Creatinine, Ser: 0.72 mg/dL (ref 0.61–1.24)
GFR, Estimated: >60 mL/min (ref >60)
Glucose, Bld: 89 mg/dL (ref 70–99)
Potassium: 3.3 mmol/L — ABNORMAL LOW (ref 3.5–5.1)
Sodium: 131 mmol/L — ABNORMAL LOW (ref 135–145)

## 2023-12-19 LAB — MAGNESIUM: Magnesium: 1.9 mg/dL (ref 1.7–2.4)

## 2023-12-19 MED ORDER — FUROSEMIDE 10 MG/ML IJ SOLN
40.0000 mg | Freq: Two times a day (BID) | INTRAMUSCULAR | Status: DC
  Administered 2023-12-19 – 2023-12-22 (×6): 40 mg via INTRAVENOUS
  Filled 2023-12-19 (×6): qty 4

## 2023-12-19 MED ORDER — POTASSIUM CHLORIDE CRYS ER 20 MEQ PO TBCR
40.0000 meq | EXTENDED_RELEASE_TABLET | ORAL | Status: AC
  Administered 2023-12-19 (×2): 40 meq via ORAL
  Filled 2023-12-19 (×2): qty 2

## 2023-12-19 NOTE — Progress Notes (Signed)
 Mobility Specialist - Progress Note   12/19/23 1443  Mobility  Activity Ambulated with assistance in hallway;Stood at bedside;Dangled on edge of bed  Level of Assistance Standby assist, set-up cues, supervision of patient - no hands on  Assistive Device Front wheel walker  Distance Ambulated (ft) 800 ft  Activity Response Tolerated well  Mobility Referral Yes  Mobility visit 1 Mobility  Mobility Specialist Start Time (ACUTE ONLY) 1413  Mobility Specialist Stop Time (ACUTE ONLY) 1431  Mobility Specialist Time Calculation (min) (ACUTE ONLY) 18 min   Pt EOB on RA upon arrival. Pt STS and ambulates in hallway Supervision. 400 ft with RW and 400 without AD, does use hand rails in hallway. Pt returns to bed with needs in reach and visitor present.   Wash Hack  Mobility Specialist  12/19/23 2:44 PM

## 2023-12-19 NOTE — Plan of Care (Signed)

## 2023-12-19 NOTE — Progress Notes (Signed)
 PROGRESS NOTE    Joel Harrell  GMW:[id number] DOB: October 04, 1955 DOA: 12/10/2023 PCP: Patient, No Pcp Per  [id number]  LOS: 9 days   Brief hospital course:   Assessment & Plan: Joel Harrell is a 68 y.o. male with medical history significant for asthma, CHF, hypertension, alcoholic liver cirrhosis, and COPD, who presented to the emergency room with acute onset of bilateral lower extremity edema as well as abdominal distention with scrotal edema worsening over the last week. He admitted to drinking 12 pack beer per week although his family stated that he also drinks liquor.  Upon arriving to hospital, hemoglobin was 4.2.  Patient states that he had a black stool a month ago, since then, he has been having brown stools every day. Due to liver cirrhosis, patient was given Protonix , octreotide , and PRBC.  GI consult obtained.  EGD showed portal hypertension gastropathy, esophageal varices.  No active bleeding. Colonoscopy was performed 6/2, identified a mass in the sigmoid colon, with oozing blood.  Biopsy results came back with colon cancer.  Radiation oncology consulted.  Acute lower GI bleeding secondary to colon cancer.   Patient came in with hemoglobin of 4.3, initially received 4 units of PRBC and IV iron . Patient had EGD performed on 6/1, showed esophageal varices and portal hypertensive gastropathy, but did not show any active bleeding.   However, colonoscopy showed sigmoid mass with oozing of blood.  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   Patient had a worsening rectal bleeding after biopsy, CT angiogram initially showed active bleeding, IR performed mesentery angiogram later, did not show any active bleeding. Patient required 2 more units of blood transfusion.  Dr. Jeane Miguel contacted [facility], tried to transfer to surgery service.  However, transfer was declined, they do not believe patient can survive surgery.  They recommended palliative care versus hospice. --oncology and  radiation oncology consulted --cont radiation per rad onc --outpatient f/u with med onc   Acute blood loss anemia secondary to GI bleed. Iron  deficient anemia.   --7u pRBC so far --monitor Hgb  Pancytopenia secondary to liver cirrhosis.  Alcoholic cirrhosis of liver with ascites (HCC) Portal hypertension gastropathy.  Esophageal varices. Alcohol abuse. --paracentesis on 6/3 with 3L removed --cont thiamine  and folic acid  --outpatient f/u with GI  Anasarca  --severe penile and scrotal swelling --cont IV lasix  40 BID  Hyponatremia   Hypokalemia. Hypomagnesemia. --due to diuresis --monitor and supplement PRN  Hypophosphatemia. --monitor and supplement PRN   Urinary retention secondary to benign prostate hypertrophy and scrotal edema. Foley catheter was anchored by urology.  Continue to follow. Will keep Foley catheter at discharge, patient will be followed with urology as outpatient.   Essential hypertension --hold amlodipine  and Lisinopril  while diuresing   Chronic obstructive pulmonary disease (COPD) (HCC) --stable    DVT prophylaxis: Lovenox  SQ Code Status: Full code  Family Communication:  Level of care: Med-Surg Dispo:   The patient is from: home Anticipated d/c is to: home Anticipated d/c date is: >3 days   Subjective and Interval History:  Swelling continued to improve.   Objective: Vitals:   12/19/23 0349 12/19/23 0746 12/19/23 1533 12/19/23 1950  BP: 135/76 130/80 137/72 131/74  Pulse: 92 96 95 95  Resp: 20 16 20 16   Temp: 98.2 F (36.8 C) 99 F (37.2 C) 98.2 F (36.8 C) 98.3 F (36.8 C)  TempSrc: Oral Oral Oral Oral  SpO2: 100% 98% 100% 100%  Weight:      Height:  Intake/Output Summary (Last 24 hours) at 12/19/2023 1956 Last data filed at 12/19/2023 1922 Gross per 24 hour  Intake 600 ml  Output 2300 ml  Net -1700 ml   Filed Weights   12/10/23 2142 12/11/23 0201  Weight: 113.2 kg 105.9 kg    Examination:   Constitutional:  NAD, AAOx3 HEENT: conjunctivae and lids normal, EOMI CV: No cyanosis.   RESP: normal respiratory effort, on RA Extremities: tenseedema in BLE SKIN: warm, dry Neuro: II - XII grossly intact.   Psych: Normal mood and affect.  Appropriate judgement and reason  Penile and scrotal swelling but improved   Data Reviewed: I have personally reviewed labs and imaging studies  Time spent: 35 minutes  Joel Kanner, MD [facility] If 7PM-7AM, please contact night-coverage 12/19/2023, 7:56 PM

## 2023-12-19 NOTE — Plan of Care (Signed)

## 2023-12-20 ENCOUNTER — Other Ambulatory Visit: Payer: Self-pay

## 2023-12-20 ENCOUNTER — Ambulatory Visit

## 2023-12-20 LAB — BASIC METABOLIC PANEL WITH GFR
Anion gap: 4 — ABNORMAL LOW (ref 5–15)
BUN: 9 mg/dL (ref 8–23)
CO2: 24 mmol/L (ref 22–32)
Calcium: 7.4 mg/dL — ABNORMAL LOW (ref 8.9–10.3)
Chloride: 102 mmol/L (ref 98–111)
Creatinine, Ser: 0.73 mg/dL (ref 0.61–1.24)
GFR, Estimated: >60 mL/min (ref >60)
Glucose, Bld: 90 mg/dL (ref 70–99)
Potassium: 3.5 mmol/L (ref 3.5–5.1)
Sodium: 130 mmol/L — ABNORMAL LOW (ref 135–145)

## 2023-12-20 LAB — RAD ONC ARIA SESSION SUMMARY
Course Elapsed Days: 5
Plan Fractions Treated to Date: 4
Plan Prescribed Dose Per Fraction: 3 Gy
Plan Total Fractions Prescribed: 10
Plan Total Prescribed Dose: 30 Gy
Reference Point Dosage Given to Date: 12 Gy
Reference Point Session Dosage Given: 3 Gy
Session Number: 4

## 2023-12-20 LAB — MAGNESIUM: Magnesium: 1.6 mg/dL — ABNORMAL LOW (ref 1.7–2.4)

## 2023-12-20 LAB — HEMOGLOBIN: Hemoglobin: 8.3 g/dL — ABNORMAL LOW (ref 13.0–17.0)

## 2023-12-20 MED ORDER — MAGNESIUM SULFATE 4 GM/100ML IV SOLN
4.0000 g | Freq: Once | INTRAVENOUS | Status: AC
  Administered 2023-12-20: 4 g via INTRAVENOUS
  Filled 2023-12-20: qty 100

## 2023-12-20 MED ORDER — POTASSIUM CHLORIDE CRYS ER 20 MEQ PO TBCR
40.0000 meq | EXTENDED_RELEASE_TABLET | Freq: Once | ORAL | Status: AC
  Administered 2023-12-20: 40 meq via ORAL
  Filled 2023-12-20: qty 2

## 2023-12-20 NOTE — TOC Progression Note (Signed)
 Transition of Care [facility]) - Progression Note    Patient Details  Name: Joel Harrell MRN: [id number] Date of Birth: 03/02/1956  [facility]) CM/SW Contact  Loman Risk, RN Phone Number: 12/20/2023, 12:17 PM  Clinical Narrative:     Received request from significant other Moira Andrews for assistance with Medicaid application.  Secure email sent to financial counselor to request screening for medicaid  Patient agreeable to [facility] and RW for discharge  Expected Discharge Plan: Home w Home Health Services Barriers to Discharge: Continued Medical Work up  Expected Discharge Plan and Services     Post Acute Care Choice: Home Health, Durable Medical Equipment Living arrangements for the past 2 months: Apartment                           HH Arranged: RN, PT, OT [facility] Agency: [facility] Date [facility] Agency Contacted: 12/14/23   Representative spoke with at [facility] Agency: Bartholomew Light   Social Determinants of Health (SDOH) Interventions SDOH Screenings   Food Insecurity: No Food Insecurity (12/11/2023)  Housing: Low Risk  (12/11/2023)  Transportation Needs: No Transportation Needs (12/11/2023)  Utilities: Not At Risk (12/11/2023)  Social Connections: Moderately Integrated (12/11/2023)  Tobacco Use: Low Risk  (12/13/2023)    Readmission Risk Interventions    12/15/2023    8:53 AM  Readmission Risk Prevention Plan  PCP or Specialist Appt within 3-5 Days Complete  HRI or Home Care Consult Complete  Social Work Consult for Recovery Care Planning/Counseling Complete  Palliative Care Screening Not Applicable

## 2023-12-20 NOTE — Progress Notes (Signed)
 Patient is not able to walk the distance required to go the bathroom, or he/she is unable to safely negotiate stairs required to access the bathroom.  A 3in1 BSC will alleviate this problem

## 2023-12-20 NOTE — Progress Notes (Signed)
 PROGRESS NOTE    Joel Harrell  QIO:[id number] DOB: 11-08-1955 DOA: 12/10/2023 PCP: Patient, No Pcp Per  [id number]  LOS: 10 days   Brief hospital course:   Assessment & Plan: Joel Harrell is a 68 y.o. male with medical history significant for asthma, CHF, hypertension, alcoholic liver cirrhosis, and COPD, who presented to the emergency room with acute onset of bilateral lower extremity edema as well as abdominal distention with scrotal edema worsening over the last week. He admitted to drinking 12 pack beer per week although his family stated that he also drinks liquor.  Upon arriving to hospital, hemoglobin was 4.2.  Patient states that he had a black stool a month ago, since then, he has been having brown stools every day. Due to liver cirrhosis, patient was given Protonix , octreotide , and PRBC.  GI consult obtained.  EGD showed portal hypertension gastropathy, esophageal varices.  No active bleeding. Colonoscopy was performed 6/2, identified a mass in the sigmoid colon, with oozing blood.  Biopsy results came back with colon cancer.  Radiation oncology consulted.  Acute lower GI bleeding secondary to colon cancer.   Patient came in with hemoglobin of 4.3, initially received 4 units of PRBC and IV iron . Patient had EGD performed on 6/1, showed esophageal varices and portal hypertensive gastropathy, but did not show any active bleeding.   However, colonoscopy showed sigmoid mass with oozing of blood.  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   Patient had a worsening rectal bleeding after biopsy, CT angiogram initially showed active bleeding, IR performed mesentery angiogram later, did not show any active bleeding. Patient required 2 more units of blood transfusion.  Dr. Jeane Miguel contacted [facility], tried to transfer to surgery service.  However, transfer was declined, they do not believe patient can survive surgery.  They recommended palliative care versus hospice. --oncology and  radiation oncology consulted --s/p 4 radiation tx --can discharge now and continue radiation tx as outpatient, per rad onc --outpatient f/u with med onc   Acute blood loss anemia secondary to GI bleed. Iron  deficient anemia.   --7u pRBC so far.  Hgb stable around 8's --monitor Hgb  Pancytopenia secondary to liver cirrhosis.  Alcoholic cirrhosis of liver with ascites (HCC) Portal hypertension gastropathy.  Esophageal varices. Alcohol abuse. --paracentesis on 6/3 with 3L removed --cont thiamine  and folic acid  --outpatient f/u with GI  Anasarca  --severe penile and scrotal swelling --cont IV lasix  40 BID  Hyponatremia   Hypokalemia. Hypomagnesemia. --due to diuresis --monitor and supplement PRN  Hypophosphatemia. --monitor and supplement PRN   Urinary retention secondary to benign prostate hypertrophy and scrotal edema. Foley catheter was anchored by urology.  Continue to follow. Will keep Foley catheter at discharge, patient will be followed with urology as outpatient.   Essential hypertension --hold amlodipine  and Lisinopril  while diuresing   Chronic obstructive pulmonary disease (COPD) (HCC) --stable    DVT prophylaxis: Lovenox  SQ Code Status: Full code  Family Communication: wife updated at bedside today Level of care: Med-Surg Dispo:   The patient is from: home Anticipated d/c is to: home Anticipated d/c date is: tomorrow   Subjective and Interval History:  Scrotal swelling continued to improve.   Objective: Vitals:   12/19/23 1950 12/20/23 0323 12/20/23 0800 12/20/23 1624  BP: 131/74 117/72 (!) 105/57 128/75  Pulse: 95 94 87 90  Resp: 16 16 16 17   Temp: 98.3 F (36.8 C) 99.1 F (37.3 C) 98.9 F (37.2 C) 98 F (36.7 C)  TempSrc: Oral Oral  SpO2: 100% 99% 98% 100%  Weight:      Height:        Intake/Output Summary (Last 24 hours) at 12/20/2023 1712 Last data filed at 12/20/2023 1454 Gross per 24 hour  Intake 360 ml  Output 3500 ml  Net  -3140 ml   Filed Weights   12/10/23 2142 12/11/23 0201  Weight: 113.2 kg 105.9 kg    Examination:   Constitutional: NAD, AAOx3 HEENT: conjunctivae and lids normal, EOMI CV: No cyanosis.   RESP: normal respiratory effort, on RA Extremities: tense edema in BLE, but mproved SKIN: warm, dry Neuro: II - XII grossly intact.   Psych: Normal mood and affect.  Appropriate judgement and reason  Penile and scrotal swelling but improved   Data Reviewed: I have personally reviewed labs and imaging studies  Time spent: 35 minutes  Garrison Kanner, MD [facility] If 7PM-7AM, please contact night-coverage 12/20/2023, 5:12 PM

## 2023-12-20 NOTE — Progress Notes (Signed)
 Occupational Therapy Treatment Patient Details Name: Balin Vandegrift MRN: [id number] DOB: 15-Oct-1955 Today's Date: 12/20/2023   History of present illness Pt is a 68 yo male admitted for potential GI bleed, LE edema. PMH of asthma, CHF, HTN, etoh.   OT comments  Pt seen for OT tx. Pt eager to go for a walk. Pt completed bed mobility with modified independence and stood with supv using RW. Pt ambulated ~400' with RW with supv-SBA. Denied SOB or pain throughout. Pt educated in ECS and instructed in how to implement learned strategies into ADL/mobility routines as well as AE/DME. Pt verbalized understanding. Pt progressing well towards goals. Continues to benefit from skilled OT services.       If plan is discharge home, recommend the following:  A little help with walking and/or transfers;A little help with bathing/dressing/bathroom;Help with stairs or ramp for entrance;Assistance with cooking/housework   Equipment Recommendations  BSC/3in1;Other (comment) (BSC over toilet)    Recommendations for Other Services      Precautions / Restrictions Precautions Precautions: Fall Recall of Precautions/Restrictions: Intact Restrictions Weight Bearing Restrictions Per Provider Order: No       Mobility Bed Mobility Overal bed mobility: Modified Independent             General bed mobility comments: no direct assist or difficulty noted    Transfers Overall transfer level: Modified independent Equipment used: Rolling walker (2 wheels) Transfers: Sit to/from Stand Sit to Stand: Supervision           General transfer comment: supv with good hand placement     Balance Overall balance assessment: Needs assistance Sitting-balance support: Feet supported Sitting balance-[name]: Good     Standing balance support: Reliant on assistive device for balance, Bilateral upper extremity supported, Single extremity supported, During functional activity Standing balance-[name]:  Fair Standing balance comment: good with BUE support on RW, fair with UE on hall railing                           ADL either performed or assessed with clinical judgement   ADL Overall ADL's : Needs assistance/impaired                 Upper Body Dressing : Set up;Sitting;Standing Upper Body Dressing Details (indicate cue type and reason): don gown                 Functional mobility during ADLs: Contact guard assist;Supervision/safety      Extremity/Trunk Assessment              Vision       Perception     Praxis     Communication Communication Communication: No apparent difficulties   Cognition Arousal: Alert Behavior During Therapy: WFL for tasks assessed/performed Cognition: No apparent impairments                               Following commands: Intact        Cueing   Cueing Techniques: Verbal cues  Exercises Other Exercises Other Exercises: Pt ambulated ~400' with RW with supv-SBA. Denied SOB or pain throughout. Pt educated in ECS and instructed in how to implement learned strategies into ADL/mobility routines as well as AE/DME.    Shoulder Instructions       General Comments      Pertinent Vitals/ Pain       Pain Assessment Pain Assessment:  No/denies pain  Home Living                                          Prior Functioning/Environment              Frequency  Min 2X/week        Progress Toward Goals  OT Goals(current goals can now be found in the care plan section)  Progress towards OT goals: Progressing toward goals  Acute Rehab OT Goals Patient Stated Goal: return home OT Goal Formulation: With patient Time For Goal Achievement: 12/27/23 Potential to Achieve Goals: Good  Plan      Co-evaluation                 AM-PAC OT "6 Clicks" Daily Activity     Outcome Measure   Help from another person eating meals?: None Help from another person taking care of  personal grooming?: None Help from another person toileting, which includes using toliet, bedpan, or urinal?: A Little Help from another person bathing (including washing, rinsing, drying)?: A Little Help from another person to put on and taking off regular upper body clothing?: None Help from another person to put on and taking off regular lower body clothing?: A Little 6 Click Score: 21    End of Session Equipment Utilized During Treatment: Rolling walker (2 wheels);Gait belt  OT Visit Diagnosis: Other abnormalities of gait and mobility (R26.89);Muscle weakness (generalized) (M62.81)   Activity Tolerance Patient tolerated treatment well   Patient Left in bed;with call bell/phone within reach;Other (comment) (seated EOB with tray table, waiting for lunch)   Nurse Communication          Time: 1191-4782 OT Time Calculation (min): 13 min  Charges: OT General Charges $OT Visit: 1 Visit OT Treatments $Therapeutic Activity: 8-22 mins  Berenda Breaker., MPH, MS, OTR/L ascom [phone] 12/20/23, 3:25 PM

## 2023-12-21 ENCOUNTER — Ambulatory Visit

## 2023-12-21 ENCOUNTER — Other Ambulatory Visit: Payer: Self-pay

## 2023-12-21 DIAGNOSIS — K921 Melena: Secondary | ICD-10-CM | POA: Diagnosis not present

## 2023-12-21 LAB — BASIC METABOLIC PANEL WITH GFR
Anion gap: 8 (ref 5–15)
BUN: 10 mg/dL (ref 8–23)
CO2: 23 mmol/L (ref 22–32)
Calcium: 7.7 mg/dL — ABNORMAL LOW (ref 8.9–10.3)
Chloride: 102 mmol/L (ref 98–111)
Creatinine, Ser: 0.68 mg/dL (ref 0.61–1.24)
GFR, Estimated: >60 mL/min (ref >60)
Glucose, Bld: 97 mg/dL (ref 70–99)
Potassium: 3.2 mmol/L — ABNORMAL LOW (ref 3.5–5.1)
Sodium: 133 mmol/L — ABNORMAL LOW (ref 135–145)

## 2023-12-21 LAB — RAD ONC ARIA SESSION SUMMARY
Course Elapsed Days: 6
Plan Fractions Treated to Date: 5
Plan Prescribed Dose Per Fraction: 3 Gy
Plan Total Fractions Prescribed: 10
Plan Total Prescribed Dose: 30 Gy
Reference Point Dosage Given to Date: 15 Gy
Reference Point Session Dosage Given: 3 Gy
Session Number: 5

## 2023-12-21 LAB — HEMOGLOBIN: Hemoglobin: 8.5 g/dL — ABNORMAL LOW (ref 13.0–17.0)

## 2023-12-21 LAB — MAGNESIUM: Magnesium: 1.9 mg/dL (ref 1.7–2.4)

## 2023-12-21 MED ORDER — POTASSIUM CHLORIDE CRYS ER 20 MEQ PO TBCR
40.0000 meq | EXTENDED_RELEASE_TABLET | ORAL | Status: AC
  Administered 2023-12-21 (×2): 40 meq via ORAL
  Filled 2023-12-21 (×2): qty 2

## 2023-12-21 NOTE — Progress Notes (Addendum)
 PROGRESS NOTE    Joel Harrell  GNF:[id number] DOB: January 03, 1956 DOA: 12/10/2023 PCP: Patient, No Pcp Per  [id number]  LOS: 11 days   Brief hospital course:   Assessment & Plan: Joel Harrell is a 68 y.o. male with medical history significant for asthma, CHF, hypertension, alcoholic liver cirrhosis, and COPD, who presented to the emergency room with acute onset of bilateral lower extremity edema as well as abdominal distention with scrotal edema worsening over the last week.   He admitted to drinking 12 pack beer per week although his family stated that he also drinks liquor.  Upon arriving to hospital, hemoglobin was 4.2.  Patient states that he had a black stool a month ago, since then, he has been having brown stools every day.  Due to liver cirrhosis, patient was given Protonix , octreotide , and PRBC.  GI consult obtained.  EGD showed portal hypertension gastropathy, esophageal varices.  No active bleeding. Colonoscopy was performed 6/2, identified a mass in the sigmoid colon, with oozing blood.  Biopsy results came back with colon cancer.  Radiation oncology consulted.  Acute lower GI bleeding secondary to colon cancer.   Patient came in with hemoglobin of 4.3, initially received 4 units of PRBC and IV iron . Patient had EGD performed on 6/1, showed esophageal varices and portal hypertensive gastropathy, but did not show any active bleeding.   However, colonoscopy showed sigmoid mass with oozing of blood.  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   Patient had a worsening rectal bleeding after biopsy, CT angiogram initially showed active bleeding, IR performed mesentery angiogram later, did not show any active bleeding.  Dr. Jeane Miguel contacted [facility], tried to transfer to surgery service.  However, transfer was declined, they do not believe patient can survive surgery.  They recommended palliative care versus hospice. --oncology and radiation oncology consulted --getting 5th session of  radiation today. --can discharge now and continue radiation tx as outpatient, per rad onc --outpatient f/u with med onc   Acute blood loss anemia secondary to GI bleed. Iron  deficient anemia.   --7u pRBC so far.  Hgb stable around 8's --monitor Hgb  Pancytopenia secondary to liver cirrhosis.  Alcoholic cirrhosis of liver with ascites (HCC) Portal hypertension gastropathy.  Esophageal varices. Alcohol abuse. --paracentesis on 6/3 with 3L removed --cont thiamine  and folic acid  --outpatient f/u with GI (consulted GI and saw Dr. Mamie Searles during this hospitalization)  Anasarca  --severe swelling in penis, scrotum, abdomen and BLE. --has been receiving IV lasix  40 BID with good urine output and stable Cr.   --cont IV lasix  40 BID while inpatient. --consider discharge on oral lasix  and aldactone.  Hyponatremia   Hypokalemia. Hypomagnesemia. --due to diuresis --monitor and supplement PRN  Hypophosphatemia. --monitor and supplement PRN   Urinary retention secondary to benign prostate hypertrophy and scrotal edema. Foley catheter was anchored by urology after presentation. --remove Foley today for voiding trial since penile and scrotal edema have significantly improved.   Essential hypertension --hold amlodipine  and Lisinopril  while diuresing   Chronic obstructive pulmonary disease (COPD) (HCC) --stable    DVT prophylaxis: Lovenox  SQ Code Status: Full code  Family Communication:  Level of care: Med-Surg Dispo:   The patient is from: home Anticipated d/c is to: home Anticipated d/c date is: tomorrow   Subjective and Interval History:  Swelling continued to improve.  Foley removed today.   Objective: Vitals:   12/20/23 1926 12/20/23 2348 12/21/23 0322 12/21/23 0804  BP: 125/78  124/75 115/76  Pulse: 90  (!) 104  98  Resp: 20  20 16   Temp: 98 F (36.7 C)  97.9 F (36.6 C) 98.2 F (36.8 C)  TempSrc:   Oral   SpO2: 100% 100% 99% 99%  Weight:      Height:         Intake/Output Summary (Last 24 hours) at 12/21/2023 1859 Last data filed at 12/21/2023 1300 Gross per 24 hour  Intake 720 ml  Output 2000 ml  Net -1280 ml   Filed Weights   12/10/23 2142 12/11/23 0201  Weight: 113.2 kg 105.9 kg    Examination:   Constitutional: NAD, AAOx3 HEENT: conjunctivae and lids normal, EOMI CV: No cyanosis.   RESP: normal respiratory effort, on RA Extremities: tense edema in BLE SKIN: warm, dry Neuro: II - XII grossly intact.   Psych: Normal mood and affect.  Appropriate judgement and reason  Penile and scrotal swelling but improved   Data Reviewed: I have personally reviewed labs and imaging studies  Time spent: 35 minutes  Garrison Kanner, MD [facility] If 7PM-7AM, please contact night-coverage 12/21/2023, 6:59 PM

## 2023-12-21 NOTE — TOC Progression Note (Addendum)
 Transition of Care [facility]) - Progression Note    Patient Details  Name: Joel Harrell MRN: [id number] Date of Birth: 07/29/1955  Transition of Care [facility]) CM/SW Contact  Loman Risk, RN Phone Number: 12/21/2023, 11:49 AM  Clinical Narrative:     Rw delivered to room by Jon with [employer] .   Per Sam Creighton due patients insurance coverage he is not eligible for [facility].  Sam Creighton has notified patient   Received call from patient's significant other requesting information for home health delivery.  Provided her with the phone numbers for [employer] on Wheels and [employer], also added information to AVS Expected Discharge Plan: Home w Home Health Services Barriers to Discharge: Continued Medical Work up  Expected Discharge Plan and Services     Post Acute Care Choice: Home Health, Durable Medical Equipment Living arrangements for the past 2 months: Apartment                           HH Arranged: RN, PT, OT HH Agency: [facility] Date [facility] Agency Contacted: 12/14/23   Representative spoke with at [facility] Agency: Bartholomew Light   Social Determinants of Health (SDOH) Interventions SDOH Screenings   Food Insecurity: No Food Insecurity (12/11/2023)  Housing: Low Risk  (12/11/2023)  Transportation Needs: No Transportation Needs (12/11/2023)  Utilities: Not At Risk (12/11/2023)  Social Connections: Moderately Integrated (12/11/2023)  Tobacco Use: Low Risk  (12/13/2023)    Readmission Risk Interventions    12/15/2023    8:53 AM  Readmission Risk Prevention Plan  PCP or Specialist Appt within 3-5 Days Complete  HRI or Home Care Consult Complete  Social Work Consult for Recovery Care Planning/Counseling Complete  Palliative Care Screening Not Applicable

## 2023-12-21 NOTE — Discharge Instructions (Signed)
 Food Resources    Agency Name: [employer] Address: [phone] [address], [place], [place] [address] Phone: [phone] Website: [email or web address] Service(s) Offered: Home delivered hot, frozen, and emergency  meals. Grocery assistance program which matches  volunteers one-on-one with seniors unable to grocery shop  for themselves. Must be 60 years and older; less than 20  hours of in-home aide service, limited or no driving ability;  live alone or with someone with a disability; live in  [place].    Agency Name: [employer] [facility] Address: [place] [address], [place] [address] Phone: [phone]  Services Offered: Delivers meals every Thursday

## 2023-12-21 NOTE — Progress Notes (Signed)
 Mobility Specialist - Progress Note   12/21/23 1134  Mobility  Activity Ambulated with assistance in hallway;Ambulated with assistance to bathroom  Level of Assistance Standby assist, set-up cues, supervision of patient - no hands on  Assistive Device Front wheel walker;None  Distance Ambulated (ft) 350 ft  Activity Response Tolerated well  Mobility visit 1 Mobility  Mobility Specialist Start Time (ACUTE ONLY) 1112  Mobility Specialist Stop Time (ACUTE ONLY) 1129  Mobility Specialist Time Calculation (min) (ACUTE ONLY) 17 min   Pt semi fowler upon entry, utilizing RA. Pt agreeable to OOB amb this date. Pt completed bed mob ModI, STS to RW CGA and amb one lap around the NS with supervision. Pt amb an additional 160 ft without AD MinG-sup, intermittently utilizing railing for support. Pt denied fatigue or pain throughout activity, expressed need to "get stronger". Pt returned to the room, amb to/from the bathroom and left semi fowler with alarm set and needs within reach.   Wyatte Dames Mobility Specialist 12/21/23 11:38 AM

## 2023-12-21 NOTE — Progress Notes (Signed)
 PT Cancellation Note  Patient Details Name: Joel Harrell MRN: [id number] DOB: 1955/09/28   Cancelled Treatment:     PT attempt. Pt currently off floor with oncology. Acute PT will continue to follow per current POC progressing as able per pt tolerance.    Koleen Perna 12/21/2023, 2:58 PM

## 2023-12-22 ENCOUNTER — Other Ambulatory Visit: Payer: Self-pay

## 2023-12-22 ENCOUNTER — Ambulatory Visit

## 2023-12-22 DIAGNOSIS — D500 Iron deficiency anemia secondary to blood loss (chronic): Secondary | ICD-10-CM

## 2023-12-22 DIAGNOSIS — D509 Iron deficiency anemia, unspecified: Secondary | ICD-10-CM

## 2023-12-22 LAB — BASIC METABOLIC PANEL WITH GFR
Anion gap: 5 (ref 5–15)
BUN: 10 mg/dL (ref 8–23)
CO2: 26 mmol/L (ref 22–32)
Calcium: 7.8 mg/dL — ABNORMAL LOW (ref 8.9–10.3)
Chloride: 101 mmol/L (ref 98–111)
Creatinine, Ser: 0.75 mg/dL (ref 0.61–1.24)
GFR, Estimated: >60 mL/min (ref >60)
Glucose, Bld: 102 mg/dL — ABNORMAL HIGH (ref 70–99)
Potassium: 3.4 mmol/L — ABNORMAL LOW (ref 3.5–5.1)
Sodium: 132 mmol/L — ABNORMAL LOW (ref 135–145)

## 2023-12-22 LAB — RAD ONC ARIA SESSION SUMMARY
Course Elapsed Days: 7
Plan Fractions Treated to Date: 6
Plan Prescribed Dose Per Fraction: 3 Gy
Plan Total Fractions Prescribed: 10
Plan Total Prescribed Dose: 30 Gy
Reference Point Dosage Given to Date: 18 Gy
Reference Point Session Dosage Given: 3 Gy
Session Number: 6

## 2023-12-22 LAB — ALBUMIN: Albumin: 2.1 g/dL — ABNORMAL LOW (ref 3.5–5.0)

## 2023-12-22 LAB — HEMOGLOBIN: Hemoglobin: 8.8 g/dL — ABNORMAL LOW (ref 13.0–17.0)

## 2023-12-22 LAB — MAGNESIUM: Magnesium: 1.7 mg/dL (ref 1.7–2.4)

## 2023-12-22 MED ORDER — BISACODYL 5 MG PO TBEC
10.0000 mg | DELAYED_RELEASE_TABLET | Freq: Every day | ORAL | 0 refills | Status: DC | PRN
  Filled 2023-12-22: qty 30, 15d supply, fill #0

## 2023-12-22 MED ORDER — POLYETHYLENE GLYCOL 3350 17 GM/SCOOP PO POWD
17.0000 g | Freq: Every day | ORAL | 0 refills | Status: DC | PRN
  Filled 2023-12-22: qty 238, 14d supply, fill #0

## 2023-12-22 MED ORDER — FUROSEMIDE 40 MG PO TABS
40.0000 mg | ORAL_TABLET | Freq: Every day | ORAL | 0 refills | Status: DC
  Filled 2023-12-22: qty 30, 30d supply, fill #0

## 2023-12-22 MED ORDER — PANTOPRAZOLE SODIUM 40 MG PO TBEC
40.0000 mg | DELAYED_RELEASE_TABLET | Freq: Two times a day (BID) | ORAL | 0 refills | Status: DC
  Filled 2023-12-22: qty 60, 30d supply, fill #0

## 2023-12-22 NOTE — Progress Notes (Signed)
 Occupational Therapy Treatment Patient Details Name: Joel Harrell MRN: [id number] DOB: [birth year] Today's Date: 12/22/2023   History of present illness Pt is a 68 yo male admitted for potential GI bleed, LE edema. PMH of asthma, CHF, HTN, etoh.   OT comments  Pt seen for OT treatment on this date. Upon arrival to room pt semi supine in bed, agreeable to tx. Pt currently MODI for bed mobility, supervision to complete LB dressing tasks to simulate donning/doffing loose fitting pants that pt prefers. Pt amb into BR with no DME+ supervision, steady gait with no LOB noted during mobility. Pt returned to bed with all needs in reach. Pt educated on energy conservation techniques during ADLs, AD for LB dressing tasks such as a reacher/sock aid. Pt endorsees he plans on ordering AD for further independence at home. Pt making good progress toward goals, will continue to follow POC. Discharge recommendation have been updated to reflect pt progress.       If plan is discharge home, recommend the following:  A little help with bathing/dressing/bathroom   Equipment Recommendations  BSC/3in1;Other (comment)    Recommendations for Other Services      Precautions / Restrictions Precautions Precautions: Fall Recall of Precautions/Restrictions: Intact Restrictions Weight Bearing Restrictions Per Provider Order: No       Mobility Bed Mobility Overal bed mobility: Modified Independent Bed Mobility: Supine to Sit, Sit to Supine     Supine to sit: Modified independent (Device/Increase time) Sit to supine: Modified independent (Device/Increase time)        Transfers Overall transfer level: Modified independent Equipment used: None Transfers: Sit to/from Stand Sit to Stand: Modified independent (Device/Increase time)           General transfer comment: Good safety awareness throughout, no DME used to amb to BR     Balance Overall balance assessment: Needs assistance Sitting-balance  support: Feet supported Sitting balance-[name]: Normal Sitting balance - Comments: Steady reaching within BOS   Standing balance support: No upper extremity supported Standing balance-[name]: Good                             ADL either performed or assessed with clinical judgement   ADL Overall ADL's : Needs assistance/impaired                     Lower Body Dressing: Supervision/safety (Simulated LB dressing tasks, extra time to complete)   Toilet Transfer: Supervision/safety;Ambulation;Comfort height toilet           Functional mobility during ADLs: Supervision/safety General ADL Comments: No physical assistance to complete tasks, extra time for LB dressing due to edema    Communication Communication Communication: No apparent difficulties   Cognition Arousal: Alert Behavior During Therapy: WFL for tasks assessed/performed Cognition: No apparent impairments             OT - Cognition Comments: A/Ox4                 Following commands: Intact        Cueing   Cueing Techniques: Verbal cues  Exercises Exercises: Other exercises Other Exercises Other Exercises: Edu: Energy conservation techniques during ADLs, AD for LB dressing tasks    Shoulder Instructions       General Comments Bil edema in LEs    Pertinent Vitals/ Pain       Pain Assessment Pain Assessment: No/denies pain  Home Living  Prior Functioning/Environment              Frequency  Min 2X/week        Progress Toward Goals  OT Goals(current goals can now be found in the care plan section)  Progress towards OT goals: Progressing toward goals  Acute Rehab OT Goals OT Goal Formulation: With patient Time For Goal Achievement: 12/27/23 Potential to Achieve Goals: Good ADL Goals Pt Will Perform Lower Body Bathing: with supervision;with modified independence;sitting/lateral leans;sit  to/from stand Pt Will Perform Lower Body Dressing: with supervision;with modified independence;sitting/lateral leans;sit to/from stand Pt Will Transfer to Toilet: with supervision;with modified independence;ambulating;regular height toilet Pt Will Perform Toileting - Clothing Manipulation and hygiene: with supervision;with modified independence;sit to/from stand;sitting/lateral leans  Plan      Co-evaluation                 AM-PAC OT 6 Clicks Daily Activity     Outcome Measure   Help from another person eating meals?: None Help from another person taking care of personal grooming?: None Help from another person toileting, which includes using toliet, bedpan, or urinal?: None Help from another person bathing (including washing, rinsing, drying)?: None Help from another person to put on and taking off regular upper body clothing?: None Help from another person to put on and taking off regular lower body clothing?: A Little 6 Click Score: 23    End of Session Equipment Utilized During Treatment: Gait belt  OT Visit Diagnosis: Other abnormalities of gait and mobility (R26.89);Muscle weakness (generalized) (M62.81)   Activity Tolerance Patient tolerated treatment well   Patient Left in bed;with call bell/phone within reach   Nurse Communication Mobility status        Time: 4098-1191 OT Time Calculation (min): 11 min  Charges: OT General Charges $OT Visit: 1 Visit OT Treatments $Self Care/Home Management : 8-22 mins  Rosaria Common M.S. OTR/L  12/22/23, 3:16 PM

## 2023-12-22 NOTE — Discharge Summary (Signed)
 Physician Discharge Summary   Patient: Joel Harrell MRN: [id number]  DOB: [birth year]   Admit:     Date of Admission: 12/10/2023 Admitted from: home   Discharge: Date of discharge: 12/22/23 Disposition: Home Condition at discharge: good  CODE STATUS: FULL CODE     Discharge Physician: Melodi Sprung, DO [facility]     PCP: Patient, No Pcp Per  Recommendations for Outpatient Follow-up:  Follow up with PCP 1-2 weeks for hospital follow up Follow with oncology team as directed     Discharge Instructions     Diet - low sodium heart healthy   Complete by: As directed    Increase activity slowly   Complete by: As directed    No wound care   Complete by: As directed          Discharge Diagnoses: Principal Problem:   GI bleeding Active Problems:   Ascites due to alcoholic cirrhosis (HCC)   Alcohol abuse   COPD (chronic obstructive pulmonary disease) (HCC)   Chronic obstructive pulmonary disease (COPD) (HCC)   Essential hypertension   Pancytopenia (HCC)   Acute blood loss anemia   Iron  deficiency anemia   Hypokalemia   Hyponatremia   Hypomagnesemia   Hypophosphatemia   Portal hypertension (HCC)   Overweight (BMI 25.0-29.9)   Neoplasm of digestive system   Mass of colon   Colon cancer (HCC)   Adenocarcinoma of sigmoid colon (HCC)   Goals of care, counseling/discussion   Palliative care encounter           Hospital course / significant events:  Joel Harrell is a 68 y.o. male with medical history significant for asthma, CHF, hypertension, alcoholic liver cirrhosis, and COPD, who presented to the emergency room with acute onset of bilateral lower extremity edema as well as abdominal distention with scrotal edema worsening over the last week. He admitted to drinking 12 pack beer per week although his family stated that he also drinks liquor. Patient came in with hemoglobin of 4.3, initially received 4 units of PRBC and IV iron . Patient  states that he had a black stool a month ago, since then, he had been having brown stools every day. Due to liver cirrhosis, patient was given Protonix , octreotide , and PRBC.  GI consult obtained.  EGD showed portal hypertension gastropathy, esophageal varices.  No active bleeding. Colonoscopy was performed 6/2, identified a mass in the sigmoid colon, with oozing blood.  Biopsy results came back with colon cancer.  Medical and Radiation oncology consulted. Dr. Jeane Miguel (hospitalist) contacted [facility], tried to transfer to surgery service.  However, transfer was declined, they do not believe patient can survive surgery.  They recommended palliative care versus hospice.     Consultants:  Gastroenterology Urology Medical oncology Radiation oncology Oncology palliative care  Interventional radiology   Procedures/Surgeries: 06/01: EGD 06/02: colonoscopy w/ biopsy mass  06/02: mesentery angiogram, did not show any active bleeding. 06/03: paracentesis       ASSESSMENT & PLAN:   Acute lower GI bleeding secondary to colon cancer.   oncology and radiation oncology following Continue radiation Poor surgical candidate    Acute blood loss anemia secondary to GI bleed. Iron  deficient anemia.   Continue iron  Monitor CBC and bleeding outpatient   Pancytopenia secondary to liver cirrhosis. Alcoholic cirrhosis of liver with ascites  Portal hypertension gastropathy.   Esophageal varices. Alcohol abuse. thiamine  and folic acid  outpatient f/u with GI (consulted GI and saw Dr. Mamie Searles during this hospitalization)  Anasarca  severe swelling in penis, scrotum, abdomen and BLE. Likely d/t liver disease, hypoalbuminemia  Continue lasix  outpatient     Hyponatremia  expect will be chornic given hyervolemia w/ liver/hypoalbuminemia   Hypokalemia. Hypomagnesemia. Hypophosphatemia monitor and supplement PRN   Urinary retention secondary to benign prostate hypertrophy and scrotal  edema. Foley catheter was anchored by urology after presentation. Voiding trial successful and pt states would not want to go home on Foley anyway Seek care if concern for retention   Essential hypertension hold amlodipine  and Lisinopril  - BP low and also diuresing   Chronic obstructive pulmonary disease (COPD) Resume home meds            Discharge Instructions  Allergies as of 12/22/2023   No Known Allergies      Medication List     STOP taking these medications    amLODipine  5 MG tablet Commonly known as: NORVASC    aspirin  EC 81 MG tablet   lisinopril  20 MG tablet Commonly known as: ZESTRIL    polyethylene glycol 17 g packet Commonly known as: MIRALAX  / GLYCOLAX  Replaced by: polyethylene glycol powder 17 GM/SCOOP powder       TAKE these medications    albuterol  108 (90 Base) MCG/ACT inhaler Commonly known as: VENTOLIN  HFA Inhale 2 puffs into the lungs every 6 (six) hours as needed for wheezing or shortness of breath.   bisacodyl  5 MG EC tablet Commonly known as: DULCOLAX Take 2 tablets (10 mg total) by mouth daily as needed for moderate constipation.   budesonide -formoterol  160-4.5 MCG/ACT inhaler Commonly known as: SYMBICORT  Inhale 2 puffs into the lungs 2 (two) times daily.   ferrous sulfate  325 (65 FE) MG EC tablet Take 1 tablet (325 mg total) by mouth 2 (two) times daily.   folic acid  1 MG tablet Commonly known as: FOLVITE  Take 1 tablet (1 mg total) by mouth daily.   furosemide  40 MG tablet Commonly known as: LASIX  Take 1 tablet (40 mg total) by mouth daily.   montelukast  10 MG tablet Commonly known as: Singulair  Take 1 tablet (10 mg total) by mouth at bedtime.   pantoprazole  40 MG tablet Commonly known as: Protonix  Take 1 tablet (40 mg total) by mouth 2 (two) times daily. What changed: when to take this   polyethylene glycol powder 17 GM/SCOOP powder Commonly known as: GLYCOLAX /MIRALAX  Take 17 g by mouth daily as needed for mild  constipation. Replaces: polyethylene glycol 17 g packet   thiamine  100 MG tablet Commonly known as: Vitamin B-1 Take 1 tablet (100 mg total) by mouth daily.               Durable Medical Equipment  (From admission, onward)           Start     Ordered   12/20/23 1219  For home use only DME Walker rolling  Once       Question Answer Comment  Walker: With 5 Inch Wheels   Patient needs a walker to treat with the following condition Weakness      12/20/23 1219   12/20/23 1219  For home use only DME Bedside commode  Once       Question:  Patient needs a bedside commode to treat with the following condition  Answer:  Weakness   12/20/23 1219             Follow-up Information     Tawnya Fava, Waddell Guess, MD. Go on 01/31/2024.   Specialty: Family Medicine Why: New  primary care appointment at [facility] at 11:50 am. Please arrive 15 minutes early for intake paperwork. Bring your photo ID, insurance card(s), and medications. You will have to call your insurance to assign this provider to your plan prior to the appointment. MD NPI is [id number]. Contact information: [address] [place] [address] [phone]         Care, [facility] Follow up.   Why: They will follow up with you for your home health needs. They are arranging primary care with Thomasene Flemings, NP until your appointment at [facility]. Contact information: [address] [place] [address] [phone]                 No Known Allergies   Subjective: pt reports feelin gbetter today, iniitally some concern w/ penile/scrotal swelling and urinary difficulty but he then was voiding okay and states he wouldn't want Foley anyway. He is eager for discharge home today if possible, no CP/SOB, no bleeding, ambulating well   Discharge Exam: BP 111/73 (BP Location: Left Arm)   Pulse 94   Temp 98 F (36.7 C)   Resp 16   Ht 6' 3 (1.905 m)    Wt 105.9 kg   SpO2 99%   BMI 29.19 kg/m  General: Pt is alert, awake, not in acute distress Cardiovascular: RRR, S1/S2 +, no rubs, no gallops Respiratory: CTA bilaterally, no wheezing, no rhonchi Abdominal: Soft, NT, mild distension GU: edema penis/scrotum is mild, foreskin is retractible  Extremities: (+) edema, no cyanosis     The results of significant diagnostics from this hospitalization (including imaging, microbiology, ancillary and laboratory) are listed below for reference.     Microbiology: Recent Results (from the past 240 hours)  Aerobic/Anaerobic Culture w Gram Stain (surgical/deep wound)     Status: None   Collection Time: 12/14/23 10:52 AM   Specimen: PATH Cytology Peritoneal fluid  Result Value Ref Range Status   Specimen Description   Final    FLUID Performed at [facility], [address], [place], [place] [address]    Special Requests   Final    CYTO PERI Performed at [facility], [address] [place], [place] [address]    Gram Stain NO WBC SEEN NO ORGANISMS SEEN   Final   Culture   Final    No growth aerobically or anaerobically. Performed at [facility], [address] [place], [place] [address]    Report Status 12/19/2023 FINAL  Final     Labs: BNP (last 3 results) Recent Labs    12/10/23 2213 12/10/23 2321  BNP 161.2* 135.8*   Basic Metabolic Panel: Recent Labs  Lab 12/17/23 0456 12/18/23 0502 12/19/23 0452 12/20/23 0503 12/21/23 0352 12/22/23 0433  NA 134*  --  131* 130* 133* 132*  K 3.2* 3.3* 3.3* 3.5 3.2* 3.4*  CL 106  --  103 102 102 101  CO2 24  --  22 24 23 26   GLUCOSE 88  --  89 90 97 102*  BUN 9  --  9 9 10 10   CREATININE 0.71  --  0.72 0.73 0.68 0.75  CALCIUM 7.3*  --  7.6* 7.4* 7.7* 7.8*  MG 1.6* 1.8 1.9 1.6* 1.9 1.7   Liver Function Tests: Recent Labs  Lab 12/22/23 0433  ALBUMIN  2.1*   No results for input(s): LIPASE, AMYLASE in the last 168 hours. No results for  input(s): AMMONIA in the last  168 hours. CBC: Recent Labs  Lab 12/16/23 0419 12/17/23 0456 12/20/23 0503 12/21/23 0352 12/22/23 0433  WBC 3.7* 3.2*  --   --   --   HGB 7.4* 8.3* 8.3* 8.5* 8.8*  HCT 25.1* 27.1*  --   --   --   MCV 77.7* 77.4*  --   --   --   PLT 110* 94*  --   --   --    Cardiac Enzymes: No results for input(s): CKTOTAL, CKMB, CKMBINDEX, TROPONINI in the last 168 hours. BNP: Invalid input(s): POCBNP CBG: No results for input(s): GLUCAP in the last 168 hours. D-Dimer No results for input(s): DDIMER in the last 72 hours. Hgb A1c No results for input(s): HGBA1C in the last 72 hours. Lipid Profile No results for input(s): CHOL, HDL, LDLCALC, TRIG, CHOLHDL, LDLDIRECT in the last 72 hours. Thyroid function studies No results for input(s): TSH, T4TOTAL, T3FREE, THYROIDAB in the last 72 hours.  Invalid input(s): FREET3 Anemia work up No results for input(s): VITAMINB12, FOLATE, FERRITIN, TIBC, IRON , RETICCTPCT in the last 72 hours. Urinalysis    Component Value Date/Time   COLORURINE AMBER (A) 12/13/2023 1616   APPEARANCEUR CLEAR (A) 12/13/2023 1616   LABSPEC 1.019 12/13/2023 1616   PHURINE 5.0 12/13/2023 1616   GLUCOSEU NEGATIVE 12/13/2023 1616   HGBUR MODERATE (A) 12/13/2023 1616   BILIRUBINUR NEGATIVE 12/13/2023 1616   KETONESUR NEGATIVE 12/13/2023 1616   PROTEINUR 30 (A) 12/13/2023 1616   NITRITE NEGATIVE 12/13/2023 1616   LEUKOCYTESUR NEGATIVE 12/13/2023 1616   Sepsis Labs Recent Labs  Lab 12/16/23 0419 12/17/23 0456  WBC 3.7* 3.2*   Microbiology Recent Results (from the past 240 hours)  Aerobic/Anaerobic Culture w Gram Stain (surgical/deep wound)     Status: None   Collection Time: 12/14/23 10:52 AM   Specimen: PATH Cytology Peritoneal fluid  Result Value Ref Range Status   Specimen Description   Final    FLUID Performed at [facility], [address] [place], [place]  [address]    Special Requests   Final    CYTO PERI Performed at [facility], [address] [place], [place] [address]    Gram Stain NO WBC SEEN NO ORGANISMS SEEN   Final   Culture   Final    No growth aerobically or anaerobically. Performed at [facility], [address], [place], [place] [address]    Report Status 12/19/2023 FINAL  Final   Imaging DG Chest Portable 1 View Result Date: 12/10/2023 CLINICAL DATA:  Chest pain EXAM: PORTABLE CHEST 1 VIEW COMPARISON:  None Available. FINDINGS: Mild right basilar atelectasis. Lungs are otherwise clear. No pneumothorax or pleural effusion. Cardiac size within normal limits. Pulmonary vascularity is normal. Osseous structures are age-appropriate. No acute bone abnormality. IMPRESSION: No active disease. Electronically Signed   By: Worthy Heads M.D.   On: 12/10/2023 22:20      Time coordinating discharge: over 30 minutes  SIGNED:  Junita Kubota DO [facility]

## 2023-12-22 NOTE — TOC Transition Note (Signed)
 Transition of Care [facility]) - Discharge Note   Patient Details  Name: Tyreik Delahoussaye MRN: [id number] Date of Birth: 1955-11-03  Transition of Care [facility]) CM/SW Contact:  Loman Risk, RN Phone Number: 12/22/2023, 4:38 PM   Clinical Narrative:     Patient to discharge today Bartholomew Light with [facility] notified      Barriers to Discharge: Continued Medical Work up   Patient Goals and CMS Choice            Discharge Placement                       Discharge Plan and Services Additional resources added to the After Visit Summary for       Post Acute Care Choice: Home Health, Durable Medical Equipment                    HH Arranged: RN, PT, OT [facility] Agency: [facility] Date [facility] Agency Contacted: 12/14/23   Representative spoke with at [facility] Agency: Bartholomew Light  Social Drivers of Health (SDOH) Interventions SDOH Screenings   Food Insecurity: No Food Insecurity (12/11/2023)  Housing: Low Risk  (12/11/2023)  Transportation Needs: No Transportation Needs (12/11/2023)  Utilities: Not At Risk (12/11/2023)  Social Connections: Moderately Integrated (12/11/2023)  Tobacco Use: Low Risk  (12/13/2023)     Readmission Risk Interventions    12/15/2023    8:53 AM  Readmission Risk Prevention Plan  PCP or Specialist Appt within 3-5 Days Complete  HRI or Home Care Consult Complete  Social Work Consult for Recovery Care Planning/Counseling Complete  Palliative Care Screening Not Applicable

## 2023-12-22 NOTE — Progress Notes (Signed)
 PT Cancellation Note  Patient Details Name: Joel Harrell MRN: [id number] DOB: 08-06-1955   Cancelled Treatment:    Reason Eval/Treat Not Completed: Patient declined, no reason specified Patient has been ambulating with mobility specialists regularly over 200 feet with and without AD. RW has been delivered for home. Patient and mobility report patient is ambulating well and has no further skilled needs at this time. Signing off. Will not require HHPT upon discharge.   An Lannan 12/22/2023, 3:39 PM

## 2023-12-22 NOTE — Progress Notes (Signed)
 Pt urinated very little. Pt bladder scanned 100 ml noted. Pt denied any discomfort/pain. Will continue to monitor.

## 2023-12-22 NOTE — Progress Notes (Signed)
 Mobility Specialist - Progress Note   12/22/23 1119  Mobility  Activity Ambulated with assistance in hallway  Level of Assistance Standby assist, set-up cues, supervision of patient - no hands on  Assistive Device Front wheel walker;None  Distance Ambulated (ft) 420 ft  Activity Response Tolerated well  Mobility visit 1 Mobility  Mobility Specialist Start Time (ACUTE ONLY) 1000  Mobility Specialist Stop Time (ACUTE ONLY) 1021  Mobility Specialist Time Calculation (min) (ACUTE ONLY) 21 min   Pt supine upon entry, utilizing RA. Pt agreeable to OOB amb this date. Pt completed bed mob indep, STS to RW and dons gown with supervision. Pt amb ~180 ft with AD and ~200 ft without AD with supervision-- No LOB, denied fatigue or SOB. Pt returned to the room, left semi fowler with alarm set and needs within reach.  Raybon Conard Mobility Specialist 12/22/23 11:24 AM

## 2023-12-23 ENCOUNTER — Ambulatory Visit
Admission: RE | Admit: 2023-12-23 | Discharge: 2023-12-23 | Disposition: A | Discharge status: Home / Self Care | Source: Ambulatory Visit | Attending: Radiation Oncology | Admitting: Radiation Oncology

## 2023-12-23 ENCOUNTER — Other Ambulatory Visit: Payer: Self-pay

## 2023-12-23 DIAGNOSIS — C189 Malignant neoplasm of colon, unspecified: Secondary | ICD-10-CM | POA: Diagnosis present

## 2023-12-23 DIAGNOSIS — Z510 Encounter for antineoplastic radiation therapy: Secondary | ICD-10-CM | POA: Diagnosis present

## 2023-12-23 LAB — RAD ONC ARIA SESSION SUMMARY
Course Elapsed Days: 8
Plan Fractions Treated to Date: 7
Plan Prescribed Dose Per Fraction: 3 Gy
Plan Total Fractions Prescribed: 10
Plan Total Prescribed Dose: 30 Gy
Reference Point Dosage Given to Date: 21 Gy
Reference Point Session Dosage Given: 3 Gy
Session Number: 7

## 2023-12-24 ENCOUNTER — Ambulatory Visit
Admission: RE | Admit: 2023-12-24 | Discharge: 2023-12-24 | Disposition: A | Discharge status: Home / Self Care | Source: Ambulatory Visit | Attending: Radiation Oncology | Admitting: Radiation Oncology

## 2023-12-24 ENCOUNTER — Other Ambulatory Visit: Payer: Self-pay

## 2023-12-24 DIAGNOSIS — C189 Malignant neoplasm of colon, unspecified: Secondary | ICD-10-CM | POA: Diagnosis not present

## 2023-12-24 LAB — RAD ONC ARIA SESSION SUMMARY
Course Elapsed Days: 9
Plan Fractions Treated to Date: 8
Plan Prescribed Dose Per Fraction: 3 Gy
Plan Total Fractions Prescribed: 10
Plan Total Prescribed Dose: 30 Gy
Reference Point Dosage Given to Date: 24 Gy
Reference Point Session Dosage Given: 3 Gy
Session Number: 8

## 2023-12-27 ENCOUNTER — Other Ambulatory Visit: Payer: Self-pay

## 2023-12-27 ENCOUNTER — Ambulatory Visit
Admission: RE | Admit: 2023-12-27 | Discharge: 2023-12-27 | Disposition: A | Discharge status: Home / Self Care | Source: Ambulatory Visit | Attending: Radiation Oncology | Admitting: Radiation Oncology

## 2023-12-27 DIAGNOSIS — C189 Malignant neoplasm of colon, unspecified: Secondary | ICD-10-CM | POA: Diagnosis not present

## 2023-12-27 LAB — RAD ONC ARIA SESSION SUMMARY
Course Elapsed Days: 12
Plan Fractions Treated to Date: 9
Plan Prescribed Dose Per Fraction: 3 Gy
Plan Total Fractions Prescribed: 10
Plan Total Prescribed Dose: 30 Gy
Reference Point Dosage Given to Date: 27 Gy
Reference Point Session Dosage Given: 3 Gy
Session Number: 9

## 2023-12-28 ENCOUNTER — Other Ambulatory Visit: Payer: Self-pay

## 2023-12-28 ENCOUNTER — Ambulatory Visit
Admission: RE | Admit: 2023-12-28 | Discharge: 2023-12-28 | Disposition: A | Discharge status: Home / Self Care | Source: Ambulatory Visit | Attending: Radiation Oncology | Admitting: Radiation Oncology

## 2023-12-28 DIAGNOSIS — C189 Malignant neoplasm of colon, unspecified: Secondary | ICD-10-CM | POA: Diagnosis not present

## 2023-12-28 LAB — RAD ONC ARIA SESSION SUMMARY
Course Elapsed Days: 13
Plan Fractions Treated to Date: 10
Plan Prescribed Dose Per Fraction: 3 Gy
Plan Total Fractions Prescribed: 10
Plan Total Prescribed Dose: 30 Gy
Reference Point Dosage Given to Date: 30 Gy
Reference Point Session Dosage Given: 3 Gy
Session Number: 10

## 2023-12-29 NOTE — Radiation Completion Notes (Signed)
 Patient Name: Joel Harrell, Joel Harrell MRN: [id number] Date of Birth: Jul 30, 1955 Referring Physician:  , M.D. Date of Service: 2023-12-29 Radiation Oncologist: Glenis Langdon, M.D. [facility] [place]                             RADIATION ONCOLOGY END OF TREATMENT NOTE     Diagnosis: C18.9 Malignant neoplasm of colon, unspecified Intent: Palliative     HPI: Patient is a 68 year old male with multiple medical comorbidities including CHF hypertension alcoholic liver cirrhosis COPD marked ascites who presented with to the ER with acute onset of bilateral lower extremity edema.  He also had a hemoglobin of 4.2 and has had black stools for several months.  He underwent colonoscopy by Dr. Danise Durie showing a nonobstructing medium-sized mass found in the sigmoid colon oozing was present.  Biopsies were positive for moderately differentiated invasive adenocarcinoma arising in a tubular adenoma with high-grade dysplasia.  CT angiogram showed endoluminal clip with a down the distal transverse colon with arterial extravasation along this vicinity consistent with active GI bleeding.  Contrast-enhanced endoluminal mass and very redundant mid sigmoid colon measuring 2.4 x 2.1 cm concerning for primary colon malignancy.  Also a redundant distal transverse colon with arterial extravasation arising in the vicinity.  He did have a vessel protruding from this mass on colonoscopy.  He has been turned down for surgical intervention at [facility] as well based on his cirrhosis significant ascites.  He is having his ascites tapped by radiology today.  Patient is fairly asymptomatic from a pain threshold at this time.  He is referred to radiation oncology on emergent basis for consideration of palliative radiation therapy to attempt at curbing his GI bleed.      ==========DELIVERED PLANS==========  First Treatment Date: 2023-12-15 Last Treatment Date: 2023-12-28   Plan Name: Abd Site: Colon Technique: 3D Mode:  Photon Dose Per Fraction: 3 Gy Prescribed Dose (Delivered / Prescribed): 30 Gy / 30 Gy Prescribed Fxs (Delivered / Prescribed): 10 / 10     ==========ON TREATMENT VISIT DATES========== 2023-12-21, 2023-12-28     ==========UPCOMING VISITS========== 01/24/2024 [facility] RAD ONCOLOGY FOLLOW UP 30 Glenis Langdon, MD  01/05/2024 [facility] MED ONC PALLIATIVE CARE    Borders, Carlene Che, NP  01/05/2024 [facility] ONC HOSPITAL FU 30 Avonne Boettcher, MD  01/05/2024 [facility] ONC LAB [facility]        ==========APPENDIX - ON TREATMENT VISIT NOTES==========   See weekly On Treatment Notes in Epic for details in the Media tab (listed as Progress notes on the On Treatment Visit Dates listed above).

## 2023-12-30 ENCOUNTER — Telehealth: Payer: Self-pay | Admitting: *Deleted

## 2023-12-30 NOTE — Telephone Encounter (Signed)
 Dr. Randy Buttery has seen him once and they want to see if she would put in Symbicort  for 1 time order it says 2 puffs 2 times a day for wheezing and hopefully he will get PCP after that

## 2023-12-31 ENCOUNTER — Telehealth: Payer: Self-pay | Admitting: *Deleted

## 2023-12-31 MED ORDER — BUDESONIDE-FORMOTEROL FUMARATE 160-4.5 MCG/ACT IN AERO
2.0000 | INHALATION_SPRAY | Freq: Two times a day (BID) | RESPIRATORY_TRACT | 12 refills | Status: DC
Start: 2023-12-31 — End: 2024-04-23

## 2023-12-31 NOTE — Telephone Encounter (Signed)
 I found a message that Dr. Randy Buttery says that I can send him the Symbicort  and I called the house where he lives answer and cannot leave a message then he had another telephone number and someone answered it and they told me I had the wrong place.  He has [facility] on his list when I called to go to care they said that they have not had one in a long time I think it was maybe we he was in a place and [facility] was the ones it is caring for all the medicines.  Then I see that he uses [facility] in [place] so I sent the Symbicort  to their he just needs to get there and get the medicine so he could use it.  Could not find any telephone number that I could call to [facility] so I did the 1 that here in [place] I left a message about the Symbicort  being sent into [facility] in [place] and someone would have to go get that medicine to give it to him but it is there.  And I left his name on there to

## 2023-12-31 NOTE — Telephone Encounter (Signed)
 I got a message from one of the nurses at [facility].  Told that the doctor sent Symbicort  but the patient could not get it.  He does not have a PCP so they wanted to have Dr. Randy Buttery to send a Symbicort  to his pharmacy.  Called one of them on the list says it was [facility] and they told me that they have never had a prescription for him.  I see that he has used [facility] in [place].  I will send the prescription and he will have to go get it or have the nurse that is with him once a week may be could help

## 2023-12-31 NOTE — Telephone Encounter (Signed)
 1 time order is ok

## 2024-01-05 ENCOUNTER — Inpatient Hospital Stay (HOSPITAL_BASED_OUTPATIENT_CLINIC_OR_DEPARTMENT_OTHER): Admitting: Oncology

## 2024-01-05 ENCOUNTER — Inpatient Hospital Stay: Attending: Oncology

## 2024-01-05 ENCOUNTER — Encounter: Payer: Self-pay | Admitting: Oncology

## 2024-01-05 ENCOUNTER — Inpatient Hospital Stay (HOSPITAL_BASED_OUTPATIENT_CLINIC_OR_DEPARTMENT_OTHER): Admitting: Hospice and Palliative Medicine

## 2024-01-05 VITALS — BP 122/79 | HR 91 | Temp 97.9°F | Resp 18 | Ht 75.0 in | Wt 230.0 lb

## 2024-01-05 DIAGNOSIS — D500 Iron deficiency anemia secondary to blood loss (chronic): Secondary | ICD-10-CM

## 2024-01-05 DIAGNOSIS — I864 Gastric varices: Secondary | ICD-10-CM | POA: Insufficient documentation

## 2024-01-05 DIAGNOSIS — K6389 Other specified diseases of intestine: Secondary | ICD-10-CM | POA: Diagnosis not present

## 2024-01-05 DIAGNOSIS — K7031 Alcoholic cirrhosis of liver with ascites: Secondary | ICD-10-CM | POA: Diagnosis not present

## 2024-01-05 DIAGNOSIS — R0602 Shortness of breath: Secondary | ICD-10-CM | POA: Insufficient documentation

## 2024-01-05 DIAGNOSIS — K766 Portal hypertension: Secondary | ICD-10-CM | POA: Diagnosis not present

## 2024-01-05 DIAGNOSIS — D509 Iron deficiency anemia, unspecified: Secondary | ICD-10-CM | POA: Insufficient documentation

## 2024-01-05 DIAGNOSIS — C187 Malignant neoplasm of sigmoid colon: Secondary | ICD-10-CM | POA: Diagnosis present

## 2024-01-05 DIAGNOSIS — K3189 Other diseases of stomach and duodenum: Secondary | ICD-10-CM | POA: Diagnosis not present

## 2024-01-05 DIAGNOSIS — M7989 Other specified soft tissue disorders: Secondary | ICD-10-CM | POA: Insufficient documentation

## 2024-01-05 DIAGNOSIS — I8510 Secondary esophageal varices without bleeding: Secondary | ICD-10-CM | POA: Diagnosis not present

## 2024-01-05 DIAGNOSIS — K8020 Calculus of gallbladder without cholecystitis without obstruction: Secondary | ICD-10-CM | POA: Insufficient documentation

## 2024-01-05 DIAGNOSIS — Z809 Family history of malignant neoplasm, unspecified: Secondary | ICD-10-CM | POA: Insufficient documentation

## 2024-01-05 DIAGNOSIS — Z8249 Family history of ischemic heart disease and other diseases of the circulatory system: Secondary | ICD-10-CM | POA: Insufficient documentation

## 2024-01-05 DIAGNOSIS — I700 Atherosclerosis of aorta: Secondary | ICD-10-CM | POA: Insufficient documentation

## 2024-01-05 DIAGNOSIS — I2510 Atherosclerotic heart disease of native coronary artery without angina pectoris: Secondary | ICD-10-CM | POA: Insufficient documentation

## 2024-01-05 DIAGNOSIS — Z79899 Other long term (current) drug therapy: Secondary | ICD-10-CM | POA: Diagnosis not present

## 2024-01-05 DIAGNOSIS — C189 Malignant neoplasm of colon, unspecified: Secondary | ICD-10-CM

## 2024-01-05 DIAGNOSIS — Z515 Encounter for palliative care: Secondary | ICD-10-CM

## 2024-01-05 DIAGNOSIS — Z923 Personal history of irradiation: Secondary | ICD-10-CM | POA: Insufficient documentation

## 2024-01-05 DIAGNOSIS — I110 Hypertensive heart disease with heart failure: Secondary | ICD-10-CM | POA: Insufficient documentation

## 2024-01-05 DIAGNOSIS — K921 Melena: Secondary | ICD-10-CM | POA: Diagnosis not present

## 2024-01-05 DIAGNOSIS — J4489 Other specified chronic obstructive pulmonary disease: Secondary | ICD-10-CM | POA: Diagnosis not present

## 2024-01-05 LAB — CBC WITH DIFFERENTIAL (CANCER CENTER ONLY)
Abs Immature Granulocytes: 0 10*3/uL (ref 0.00–0.07)
Basophils Absolute: 0 10*3/uL (ref 0.0–0.1)
Basophils Relative: 1 %
Eosinophils Absolute: 0.1 10*3/uL (ref 0.0–0.5)
Eosinophils Relative: 4 %
HCT: 27.9 % — ABNORMAL LOW (ref 39.0–52.0)
Hemoglobin: 8.7 g/dL — ABNORMAL LOW (ref 13.0–17.0)
Immature Granulocytes: 0 %
Lymphocytes Relative: 26 %
Lymphs Abs: 0.7 10*3/uL (ref 0.7–4.0)
MCH: 26.1 pg (ref 26.0–34.0)
MCHC: 31.2 g/dL (ref 30.0–36.0)
MCV: 83.8 fL (ref 80.0–100.0)
Monocytes Absolute: 0.3 10*3/uL (ref 0.1–1.0)
Monocytes Relative: 12 %
Neutro Abs: 1.6 10*3/uL — ABNORMAL LOW (ref 1.7–7.7)
Neutrophils Relative %: 57 %
Platelet Count: 144 10*3/uL — ABNORMAL LOW (ref 150–400)
RBC: 3.33 MIL/uL — ABNORMAL LOW (ref 4.22–5.81)
RDW: 25.8 % — ABNORMAL HIGH (ref 11.5–15.5)
WBC Count: 2.8 10*3/uL — ABNORMAL LOW (ref 4.0–10.5)
nRBC: 0 % (ref 0.0–0.2)

## 2024-01-05 LAB — CMP (CANCER CENTER ONLY)
ALT: 10 U/L (ref 0–44)
AST: 28 U/L (ref 15–41)
Albumin: 2.4 g/dL — ABNORMAL LOW (ref 3.5–5.0)
Alkaline Phosphatase: 80 U/L (ref 38–126)
Anion gap: 6 (ref 5–15)
BUN: 6 mg/dL — ABNORMAL LOW (ref 8–23)
CO2: 21 mmol/L — ABNORMAL LOW (ref 22–32)
Calcium: 7.8 mg/dL — ABNORMAL LOW (ref 8.9–10.3)
Chloride: 104 mmol/L (ref 98–111)
Creatinine: 0.71 mg/dL (ref 0.61–1.24)
GFR, Estimated: >60 mL/min (ref >60)
Glucose, Bld: 104 mg/dL — ABNORMAL HIGH (ref 70–99)
Potassium: 3.6 mmol/L (ref 3.5–5.1)
Sodium: 131 mmol/L — ABNORMAL LOW (ref 135–145)
Total Bilirubin: 2.4 mg/dL — ABNORMAL HIGH (ref 0.0–1.2)
Total Protein: 7.7 g/dL (ref 6.5–8.1)

## 2024-01-05 LAB — IRON AND TIBC
Iron: 38 ug/dL — ABNORMAL LOW (ref 45–182)
Saturation Ratios: 18 % (ref 17.9–39.5)
TIBC: 216 ug/dL — ABNORMAL LOW (ref 250–450)
UIBC: 178 ug/dL

## 2024-01-05 LAB — FERRITIN: Ferritin: 69 ng/mL (ref 24–336)

## 2024-01-05 NOTE — Progress Notes (Signed)
 Hematology/Oncology Consult note [facility]  Telephone:([phone] [phone]  Patient Care Team: Patient, No Pcp Per as PCP - General (General Practice)   Name of the patient: Joel Harrell  [id number]  1956/01/26   Date of visit: 01/05/24  Diagnosis- newly diagnosed colon cancer  Chief complaint/ Reason for visit- post hospital discharge follow up  Heme/Onc history: patient is a 68 year old male with a past medical history significant for hypertension, COPD, liver cirrhosisWith ascites.  He has not seen GI for his cirrhosis.  He presented to the hospital with symptoms of worsening abdominal distention and scrotal edema.  He drinks about 12 packs of beer per week.  On arrival to the hospital patient was found to have melena and a hemoglobin of 4.2.  He was started on Protonix  and octreotide  as well as given PRBC transfusion.  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  Stomach was otherwise normal-appearing.  Grade 1 esophageal varices.  He subsequently underwent colonoscopy on 12/13/2023 which showed nonobstructing medium-sized mass in the sigmoid colon where oozing was present and this was biopsied and consistent with colon adenocarcinoma.  CT angiogram was done to locate the site of bleeding but target vessel could not be identified and therefore IR could not embolize it.  Attempt was made to transfer the patient for surgical consultation at [facility] but after reviewing his case [facility] declined transfer and was not deemed to be a surgical candidate due to his advanced cirrhosis. CT scans did not show distant metastatic disease.  Patient underwent palliative radiation for his sigmoid mass.  Bleeding issues stabilized after radiation and blood transfusion.  Patient also met with palliative care and was not interested in pursuing with best supportive care/hospice.    Interval history-patient feels overall fair  as compared to his hospital stay.  He continues to have significant ascites and bilateral lower extremity swelling.  He has not had follow-up with GI yet as an outpatient  ECOG PS- 3 Pain scale- 0   Review of systems- Review of Systems  Constitutional:  Positive for malaise/fatigue. Negative for chills, fever and weight loss.  HENT:  Negative for congestion, ear discharge and nosebleeds.   Eyes:  Negative for blurred vision.  Respiratory:  Positive for shortness of breath. Negative for cough, hemoptysis, sputum production and wheezing.   Cardiovascular:  Positive for leg swelling. Negative for chest pain, palpitations, orthopnea and claudication.  Gastrointestinal:  Negative for abdominal pain, blood in stool, constipation, diarrhea, heartburn, melena, nausea and vomiting.       Abdominal distension  Genitourinary:  Negative for dysuria, flank pain, frequency, hematuria and urgency.  Musculoskeletal:  Negative for back pain, joint pain and myalgias.  Skin:  Negative for rash.  Neurological:  Negative for dizziness, tingling, focal weakness, seizures, weakness and headaches.  Endo/Heme/Allergies:  Does not bruise/bleed easily.  Psychiatric/Behavioral:  Negative for depression and suicidal ideas. The patient does not have insomnia.       No Known Allergies   Past Medical History:  Diagnosis Date   Asthma    CHF (congestive heart failure) (HCC)    Hypertension      Past Surgical History:  Procedure Laterality Date   COLONOSCOPY N/A 12/13/2023   Procedure: COLONOSCOPY;  Surgeon: Jinny Carmine, MD;  Location: [facility] ENDOSCOPY;  Service: Endoscopy;  Laterality: N/A;   ESOPHAGOGASTRODUODENOSCOPY N/A 12/12/2023   Procedure: EGD (ESOPHAGOGASTRODUODENOSCOPY);  Surgeon: Onita Elspeth Sharper, DO;  Location: [facility]  ENDOSCOPY;  Service: Gastroenterology;  Laterality: N/A;   HEMOSTASIS CLIP PLACEMENT  12/13/2023   Procedure: CONTROL OF HEMORRHAGE, GI TRACT, ENDOSCOPIC, BY CLIPPING OR OVERSEWING;   Surgeon: Jinny Carmine, MD;  Location: [facility] ENDOSCOPY;  Service: Endoscopy;;   IR ANGIOGRAM PELVIS SELECTIVE OR SUPRASELECTIVE  12/13/2023   POLYPECTOMY  12/13/2023   Procedure: POLYPECTOMY, INTESTINE;  Surgeon: Jinny Carmine, MD;  Location: [facility] ENDOSCOPY;  Service: Endoscopy;;    Social History   Socioeconomic History   Marital status: Single    Spouse name: Not on file   Number of children: Not on file   Years of education: Not on file   Highest education level: Not on file  Occupational History   Not on file  Tobacco Use   Smoking status: Never   Smokeless tobacco: Never  Vaping Use   Vaping status: Never Used  Substance and Sexual Activity   Alcohol use: Yes    Alcohol/week: 10.0 standard drinks of alcohol    Types: 10 Cans of beer per week   Drug use: No   Sexual activity: Not on file  Other Topics Concern   Not on file  Social History Narrative   Not on file   Social Drivers of Health   Financial Resource Strain: Not on file  Food Insecurity: No Food Insecurity (12/11/2023)   Hunger Vital Sign    Worried About Running Out of Food in the Last Year: Never true    Ran Out of Food in the Last Year: Never true  Transportation Needs: No Transportation Needs (12/11/2023)   PRAPARE - Administrator, Civil Service (Medical): No    Lack of Transportation (Non-Medical): No  Physical Activity: Not on file  Stress: Not on file  Social Connections: Moderately Integrated (12/11/2023)   Social Connection and Isolation Panel    Frequency of Communication with Friends and Family: More than three times a week    Frequency of Social Gatherings with Friends and Family: Three times a week    Attends Religious Services: More than 4 times per year    Active Member of Clubs or Organizations: No    Attends Banker Meetings: Never    Marital Status: Married  Catering manager Violence: Not At Risk (12/11/2023)   Humiliation, Afraid, Rape, and Kick questionnaire    Fear  of Current or Ex-Partner: No    Emotionally Abused: No    Physically Abused: No    Sexually Abused: No    Family History  Problem Relation Age of Onset   Heart attack Father    Cancer Mother      Current Outpatient Medications:    albuterol  (VENTOLIN  HFA) 108 (90 Base) MCG/ACT inhaler, Inhale 2 puffs into the lungs every 6 (six) hours as needed for wheezing or shortness of breath., Disp: 6.7 g, Rfl: 2   bisacodyl  (DULCOLAX) 5 MG EC tablet, Take 2 tablets (10 mg total) by mouth daily as needed for moderate constipation., Disp: 30 tablet, Rfl: 0   budesonide -formoterol  (SYMBICORT ) 160-4.5 MCG/ACT inhaler, Inhale 2 puffs into the lungs in the morning and at bedtime., Disp: 1 each, Rfl: 12   ferrous sulfate  325 (65 FE) MG EC tablet, Take 1 tablet (325 mg total) by mouth 2 (two) times daily., Disp: 60 tablet, Rfl: 3   folic acid  (FOLVITE ) 1 MG tablet, Take 1 tablet (1 mg total) by mouth daily., Disp: 30 tablet, Rfl: 1   furosemide  (LASIX ) 40 MG tablet, Take 1 tablet (40 mg  total) by mouth daily., Disp: 30 tablet, Rfl: 0   lisinopril  (ZESTRIL ) 20 MG tablet, Take 20 mg by mouth daily., Disp: , Rfl:    montelukast  (SINGULAIR ) 10 MG tablet, Take 1 tablet (10 mg total) by mouth at bedtime., Disp: 30 tablet, Rfl: 2   pantoprazole  (PROTONIX ) 40 MG tablet, Take 1 tablet (40 mg total) by mouth 2 (two) times daily., Disp: 60 tablet, Rfl: 0   thiamine  (VITAMIN B-1) 100 MG tablet, Take 1 tablet (100 mg total) by mouth daily., Disp: 30 tablet, Rfl: 1   polyethylene glycol powder (GLYCOLAX /MIRALAX ) 17 GM/SCOOP powder, Take 17 g by mouth daily as needed for mild constipation., Disp: 238 g, Rfl: 0  Physical exam:  Vitals:   01/05/24 1058  BP: 122/79  Pulse: 91  Resp: 18  Temp: 97.9 F (36.6 C)  TempSrc: Tympanic  SpO2: 100%  Weight: 230 lb (104.3 kg)  Height: 6' 3 (1.905 m)   Physical Exam Constitutional:      Comments: Fatigued. Sitting in a wheelchair   Cardiovascular:     Rate and Rhythm:  Normal rate and regular rhythm.     Heart sounds: Normal heart sounds.  Pulmonary:     Effort: Pulmonary effort is normal.     Breath sounds: Normal breath sounds.  Abdominal:     General: Bowel sounds are normal. There is distension.   Musculoskeletal:     Right lower leg: Edema present.     Left lower leg: Edema present.   Skin:    General: Skin is warm and dry.   Neurological:     Mental Status: He is alert and oriented to person, place, and time.      I have personally reviewed labs listed below:    Latest Ref Rng & Units 01/05/2024   10:09 AM  CMP  Glucose 70 - 99 mg/dL 895   BUN 8 - 23 mg/dL 6   Creatinine 9.38 - 8.75 mg/dL 9.28   Sodium 864 - 854 mmol/L 131   Potassium 3.5 - 5.1 mmol/L 3.6   Chloride 98 - 111 mmol/L 104   CO2 22 - 32 mmol/L 21   Calcium 8.9 - 10.3 mg/dL 7.8   Total Protein 6.5 - 8.1 g/dL 7.7   Total Bilirubin 0.0 - 1.2 mg/dL 2.4   Alkaline Phos 38 - 126 U/L 80   AST 15 - 41 U/L 28   ALT 0 - 44 U/L 10       Latest Ref Rng & Units 01/05/2024   10:09 AM  CBC  WBC 4.0 - 10.5 K/uL 2.8   Hemoglobin 13.0 - 17.0 g/dL 8.7   Hematocrit 60.9 - 52.0 % 27.9   Platelets 150 - 400 K/uL 144    I have personally reviewed Radiology images listed below: No images are attached to the encounter.  CT CHEST WO CONTRAST Result Date: 12/16/2023 CLINICAL DATA:  Staging of colon cancer.  * Tracking Code: BO * EXAM: CT CHEST WITHOUT CONTRAST TECHNIQUE: Multidetector CT imaging of the chest was performed following the standard protocol without IV contrast. RADIATION DOSE REDUCTION: This exam was performed according to the departmental dose-optimization program which includes automated exposure control, adjustment of the mA and/or kV according to patient size and/or use of iterative reconstruction technique. COMPARISON:  CTA of the abdomen and pelvis of 12/13/2023. Remote chest CT of 10/06/2010 FINDINGS: Cardiovascular: Aortic atherosclerosis. Tortuous thoracic aorta. Mild  cardiomegaly, without pericardial effusion. Three vessel coronary artery calcification. Pulmonary artery enlargement,  outflow tract 3.2 cm. Mediastinum/Nodes: No supraclavicular adenopathy. No mediastinal or hilar adenopathy, given limitations of unenhanced CT. Lungs/Pleura: Small bilateral pleural effusions, similar to the recent abdominal CT. Bibasilar dependent atelectasis. Minimal motion degradation. No pulmonary metastasis. Upper Abdomen: Deferred to recent diagnostic CT. Cirrhosis. Cholelithiasis. Upper abdominal ascites. Musculoskeletal: Anasarca. Mild convex right thoracic spine curvature. IMPRESSION: 1. Mildly motion degraded exam. Given this limitation, no metastatic disease in the chest. 2. Ascites, bilateral pleural effusions, and anasarca, suggesting fluid overload. 3. Pulmonary artery enlargement suggests pulmonary arterial hypertension. 4. Coronary artery atherosclerosis. Aortic Atherosclerosis (ICD10-I70.0). Electronically Signed   By: Rockey Kilts M.D.   On: 12/16/2023 18:03   US  Paracentesis Result Date: 12/14/2023 INDICATION: 68 year old male with a history of alcoholic liver cirrhosis who presented to the ED with lower extremity edema and abdominal distention. Request for diagnostic and therapeutic paracentesis. EXAM: ULTRASOUND GUIDED left PARACENTESIS MEDICATIONS: 1% lidocaine , 7 mL. COMPLICATIONS: None immediate. PROCEDURE: Informed written consent was obtained from the patient after a discussion of the risks, benefits and alternatives to treatment. A timeout was performed prior to the initiation of the procedure. Initial ultrasound scanning demonstrates a large amount of ascites within the left lower abdominal quadrant. The left lower abdomen was prepped and draped in the usual sterile fashion. 1% lidocaine  was used for local anesthesia. Following this, a 19 gauge, 7-cm, Yueh catheter was introduced. An ultrasound image was saved for documentation purposes. The paracentesis was performed.  The catheter was removed and a dressing was applied. The patient tolerated the procedure well without immediate post procedural complication. Patient received post-procedure intravenous albumin ; see nursing notes for details. FINDINGS: A total of approximately 3 L of amber fluid was removed. Samples were sent to the laboratory as requested by the clinical team. IMPRESSION: Successful ultrasound-guided paracentesis yielding 3 liters of peritoneal fluid. PLAN: If the patient eventually requires >/=2 paracenteses in a 30 day period, candidacy for formal evaluation by the [facility] will be assessed. Procedure performed by: Sherrilee Bal, PA-C under the supervision of Dr. DELENA Balder Electronically Signed   By: Juliene Balder M.D.   On: 12/14/2023 12:53   IR Angiogram Pelvis Selective Or Supraselective Result Date: 12/14/2023 CLINICAL DATA:  Active GI bleed in the distal transverse colon near an endoscopic clip on recent CTA EXAM: MESENTERIC SELECTIVE ARTERIOGRAPHY ANESTHESIA/SEDATION: Intravenous Fentanyl  25mcg administered by RN during a total moderate (conscious) sedation time of 48 minutes; the patient's level of consciousness and physiological / cardiorespiratory status were monitored continuously by radiology RN under my direct supervision. MEDICATIONS: Lidocaine  1% subcutaneous CONTRAST:  85mL OMNIPAQUE  IOHEXOL  300 MG/ML  SOLN PROCEDURE: The procedure, risks (including but not limited to bleeding, infection, organ damage ), benefits, and alternatives were explained to the patient and spouse. Questions regarding the procedure were encouraged and answered. The patient understands and consents to the procedure. Right femoral region prepped and draped in usual sterile fashion. Maximal barrier sterile technique was utilized including caps, mask, sterile gowns, sterile gloves, sterile drape, hand hygiene and skin antiseptic. The right common femoral artery was localized  under ultrasound. Under real-time ultrasound guidance, the vessel was accessed with a 21-gauge micropuncture needle, exchanged over a 018 guidewire for a transitional dilator, through which a 035 guidewire was advanced. Over this, a 5 [place] vascular sheath was placed, through which a 5 French rim catheter was advanced and used to selectively catheterize the inferior mesenteric artery for selective arteriography in multiple projections. The catheter was then exchanged over the  guidewire for a C2 catheter, then utilized to selectively catheterize the superior mesenteric artery for selective arteriography in multiple projections. The catheter and sheath were removed and hemostasis achieved with the aid of the Celt device under ultrasound guidance. The patient tolerated the procedure well. COMPLICATIONS: None immediate FINDINGS: No evidence of active extravasation, early draining vein, AVM, or other lesion to suggest a site or etiology of the patient's GI bleeding. Endoscopic clip in the distal transverse colon, corresponding to findings from recent CTA. Venous phase confirms patency of the IMV and portal venous system. IMPRESSION: 1. Negative 2-vessel mesenteric arteriogram. No evidence of active extravasation or other focal lesion to suggest etiology of GI bleed. Electronically Signed   By: JONETTA Faes M.D.   On: 12/14/2023 02:08   CT ANGIO GI BLEED Result Date: 12/13/2023 CLINICAL DATA:  Lower GI bleeding * Tracking Code: BO * EXAM: CTA ABDOMEN AND PELVIS WITHOUT AND WITH CONTRAST TECHNIQUE: Multidetector CT imaging of the abdomen and pelvis was performed using the standard protocol during bolus administration of intravenous contrast. Multiplanar reconstructed images and MIPs were obtained and reviewed to evaluate the vascular anatomy. RADIATION DOSE REDUCTION: This exam was performed according to the departmental dose-optimization program which includes automated exposure control, adjustment of the mA and/or kV  according to patient size and/or use of iterative reconstruction technique. CONTRAST:  OMNIPAQUE  IOHEXOL  350 MG/ML SOLN COMPARISON:  None Available. FINDINGS: VASCULAR Normal contour and caliber of the abdominal aorta. No evidence of aneurysm, dissection, or other acute aortic pathology. 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 Mild aortic atherosclerosis. Review of the MIP images confirms the above findings. NON-VASCULAR Lower Chest: Moderate right, small left pleural effusions and associated atelectasis or consolidation. Three-vessel coronary artery calcifications. Hepatobiliary: No solid liver abnormality is seen. Coarse contour of the liver. Gallstones. No biliary ductal dilatation. Pancreas: Unremarkable. No pancreatic ductal dilatation or surrounding inflammatory changes. Spleen: Splenomegaly, maximum span 15.5 cm. Adrenals/Urinary Tract: Adrenal glands are unremarkable. Kidneys are normal, without renal calculi, solid lesion, or hydronephrosis. Foley catheter in the bladder. Stomach/Bowel: Small gastroesophageal varices. The colon is filled with fluid and heterogeneously hyperdense material to the rectum. Endoluminal clip within the redundant distal transverse colon with arterial extravasation arising in this vicinity (series 18, image 107). Contrast enhancing endoluminal mass of the very redundant mid sigmoid colon measuring 2.4 x 2.1 cm (series 18, image 126) Lymphatic: No enlarged abdominal or pelvic lymph nodes. Reproductive: No mass or other significant abnormality. Other: No abdominal wall hernia. Severe anasarca. Moderate volume ascites throughout the abdomen pelvis. Musculoskeletal: No acute osseous findings. IMPRESSION: 1. Endoluminal clip within the redundant distal transverse colon with arterial extravasation arising in this vicinity, consistent with active GI bleeding. 2. Contrast enhancing endoluminal  mass of the very redundant mid sigmoid colon measuring 2.4 x 2.1 cm, highly concerning for primary colon malignancy. 3. The colon is filled with fluid and heterogeneously hyperdense material to the rectum, consistent with blood products. 4. Normal contour and caliber of the abdominal aorta. No evidence of aneurysm, dissection, or other acute aortic pathology. Mild aortic atherosclerosis. 5. Cirrhosis and splenomegaly.  Small gastroesophageal varices. 6. Pleural effusions, severe anasarca, and ascites. 7. Cholelithiasis.  Coronary artery disease. These results will be called to the ordering clinician or representative by the Radiologist Assistant, and communication documented in the PACS or [employer]. Aortic Atherosclerosis (ICD10-I70.0). Electronically Signed   By: Marolyn JONETTA Marlyce CHRISTELLA.D.  On: 12/13/2023 20:50   US  Abdomen Limited RUQ (LIVER/GB) Result Date: 12/12/2023 CLINICAL DATA:  [id number] Ascites due to alcoholic cirrhosis (HCC) [id number] EXAM: ULTRASOUND ABDOMEN LIMITED RIGHT UPPER QUADRANT COMPARISON:  Nov 22, 2022 FINDINGS: Gallbladder: Multiple small gallstones. No wall thickening or pericholecystic fluid. No sonographic Murphy's sign noted by sonographer. Common bile duct: Diameter: 3 mm Liver: Nodular contour with coarsened echogenicity. No focal lesion identified. No intrahepatic biliary ductal dilation. Portal vein is patent on color Doppler imaging with normal direction of blood flow towards the liver. Other: Moderate volume ascites.  Splenomegaly. IMPRESSION: 1. Cholecystolithiasis.  No changes of acute cholecystitis. 2. Cirrhotic morphology of the liver with sequelae of portal hypertension, including splenomegaly and moderate volume ascites. Continued biannual HCC surveillance recommended. Electronically Signed   By: Rogelia Myers M.D.   On: 12/12/2023 16:05   DG Chest Portable 1 View Result Date: 12/10/2023 CLINICAL DATA:  Chest pain EXAM: PORTABLE CHEST 1 VIEW COMPARISON:  None Available.  FINDINGS: Mild right basilar atelectasis. Lungs are otherwise clear. No pneumothorax or pleural effusion. Cardiac size within normal limits. Pulmonary vascularity is normal. Osseous structures are age-appropriate. No acute bone abnormality. IMPRESSION: No active disease. Electronically Signed   By: Dorethia Molt M.D.   On: 12/10/2023 22:20     Assessment and plan- Patient is a 68 y.o. 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  He has advanced cirrhosis.  He therefore underwent palliative radiation in view of bleeding from the sigmoid mass.  From a medical oncology standpoint I do not think that he is a candidate for systemic treatment either.  I will be monitoring his malignancy with a CT scan again in 4 months time.  Advanced cirrhosis: He does not have outpatient GI follow-up which is actually a bigger issue for him as compared to his colon cancer.  He was seen by [facility] while in the hospital and we will arrange an outpatient follow-up for him.  Iron  deficiency anemia: Hemoglobin is still low at 8.7.  Will proceed with 5 doses of Venofer  at this time.  Discussed risks and benefits of IV iron  including all but not limited to possible risk of infusion and anaphylactic reaction.  Patient understands and agrees to proceed as planned.  Labs in 2 and 4 months and I will see him back in 4 months with scans prior   Visit Diagnosis 1. Iron  deficiency anemia due to chronic blood loss   2. Adenocarcinoma of colon [facility])      Dr. Annah Skene, MD, MPH [facility] at [facility] [phone] 01/05/2024 3:28  PM

## 2024-01-05 NOTE — Progress Notes (Signed)
 Palliative Medicine [facility] at [facility] [phone] [phone]   Name: Joel Harrell Date: 01/05/2024 MRN: [id number]  DOB: 04/03/1956  Patient Care Team: Patient, No Pcp Per as PCP - General (General Practice)    REASON FOR CONSULTATION: Joel Harrell is a 68 y.o. 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  Patient was hospitalized with GI bleed and underwent EGD on 12/12/2023 revealing portal hypertensive gastropathy.  Colonoscopy on 12/13/2023 revealed a nonobstructing mass in the sigmoid colon, which was oozing.  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  Patient was deemed not a surgical candidate.  Palliative care was consulted to address goals.   SOCIAL HISTORY:     reports that he has never smoked. He has never used smokeless tobacco. He reports current alcohol use of about 10.0 standard drinks of alcohol per week. He reports that he does not use drugs.  Patient is married and lives at home with his wife.  He has 5 sons, most of whom live out of state.  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  ADVANCE DIRECTIVES:  Does not have  CODE STATUS: Full code  PAST MEDICAL HISTORY: Past Medical History:  Diagnosis Date   Asthma    CHF (congestive heart failure) (HCC)    Hypertension     PAST SURGICAL HISTORY:  Past Surgical History:  Procedure Laterality Date   COLONOSCOPY N/A 12/13/2023   Procedure: COLONOSCOPY;  Surgeon: Jinny Carmine, MD;  Location: [facility] ENDOSCOPY;  Service: Endoscopy;  Laterality: N/A;   ESOPHAGOGASTRODUODENOSCOPY N/A 12/12/2023   Procedure: EGD (ESOPHAGOGASTRODUODENOSCOPY);  Surgeon: Onita Elspeth Sharper, DO;  Location: [facility] ENDOSCOPY;  Service: Gastroenterology;  Laterality: N/A;   HEMOSTASIS CLIP PLACEMENT  12/13/2023   Procedure: CONTROL OF HEMORRHAGE, GI TRACT, ENDOSCOPIC, BY CLIPPING OR OVERSEWING;  Surgeon:  Jinny Carmine, MD;  Location: [facility] ENDOSCOPY;  Service: Endoscopy;;   IR ANGIOGRAM PELVIS SELECTIVE OR SUPRASELECTIVE  12/13/2023   POLYPECTOMY  12/13/2023   Procedure: POLYPECTOMY, INTESTINE;  Surgeon: Jinny Carmine, MD;  Location: [facility] ENDOSCOPY;  Service: Endoscopy;;    HEMATOLOGY/ONCOLOGY HISTORY:  Oncology History   No history exists.    ALLERGIES:  has no known allergies.  MEDICATIONS:  Current Outpatient Medications  Medication Sig Dispense Refill   albuterol  (VENTOLIN  HFA) 108 (90 Base) MCG/ACT inhaler Inhale 2 puffs into the lungs every 6 (six) hours as needed for wheezing or shortness of breath. 6.7 g 2   bisacodyl  (DULCOLAX) 5 MG EC tablet Take 2 tablets (10 mg total) by mouth daily as needed for moderate constipation. 30 tablet 0   budesonide -formoterol  (SYMBICORT ) 160-4.5 MCG/ACT inhaler Inhale 2 puffs into the lungs in the morning and at bedtime. 1 each 12   ferrous sulfate  325 (65 FE) MG EC tablet Take 1 tablet (325 mg total) by mouth 2 (two) times daily. 60 tablet 3   folic acid  (FOLVITE ) 1 MG tablet Take 1 tablet (1 mg total) by mouth daily. 30 tablet 1   furosemide  (LASIX ) 40 MG tablet Take 1 tablet (40 mg total) by mouth daily. 30 tablet 0   lisinopril  (ZESTRIL ) 20 MG tablet Take 20 mg by mouth daily.     montelukast  (SINGULAIR ) 10 MG tablet Take 1 tablet (10 mg total) by mouth at bedtime. 30 tablet 2   pantoprazole  (PROTONIX ) 40 MG tablet Take 1 tablet (40 mg total) by mouth 2 (two) times daily. 60 tablet 0  polyethylene glycol powder (GLYCOLAX /MIRALAX ) 17 GM/SCOOP powder Take 17 g by mouth daily as needed for mild constipation. 238 g 0   thiamine  (VITAMIN B-1) 100 MG tablet Take 1 tablet (100 mg total) by mouth daily. 30 tablet 1   No current facility-administered medications for this visit.    VITAL SIGNS: There were no vitals taken for this visit. There were no vitals filed for this visit.  Estimated body mass index is 28.75 kg/m as calculated from the  following:   Height as of an earlier encounter on 01/05/24: 6' 3 (1.905 m).   Weight as of an earlier encounter on 01/05/24: 230 lb (104.3 kg).  LABS: CBC:    Component Value Date/Time   WBC 2.8 (L) 01/05/2024 1009   WBC 3.2 (L) 12/17/2023 0456   HGB 8.7 (L) 01/05/2024 1009   HGB 16.4 01/21/2014 0414   HCT 27.9 (L) 01/05/2024 1009   HCT 48.9 01/21/2014 0414   PLT 144 (L) 01/05/2024 1009   PLT 165 01/21/2014 0414   MCV 83.8 01/05/2024 1009   MCV 98 01/21/2014 0414   NEUTROABS 1.6 (L) 01/05/2024 1009   NEUTROABS 12.0 (H) 01/21/2014 0414   LYMPHSABS 0.7 01/05/2024 1009   LYMPHSABS 1.0 01/21/2014 0414   MONOABS 0.3 01/05/2024 1009   MONOABS 0.4 01/21/2014 0414   EOSABS 0.1 01/05/2024 1009   EOSABS 0.0 01/21/2014 0414   BASOSABS 0.0 01/05/2024 1009   BASOSABS 0.0 01/21/2014 0414   Comprehensive Metabolic Panel:    Component Value Date/Time   NA 131 (L) 01/05/2024 1009   NA 136 01/21/2014 0414   K 3.6 01/05/2024 1009   K 3.9 01/21/2014 0414   CL 104 01/05/2024 1009   CL 103 01/21/2014 0414   CO2 21 (L) 01/05/2024 1009   CO2 25 01/21/2014 0414   BUN 6 (L) 01/05/2024 1009   BUN 19 (H) 01/21/2014 0414   CREATININE 0.71 01/05/2024 1009   CREATININE 1.06 01/24/2014 0357   GLUCOSE 104 (H) 01/05/2024 1009   GLUCOSE 126 (H) 01/21/2014 0414   CALCIUM 7.8 (L) 01/05/2024 1009   CALCIUM 8.7 01/21/2014 0414   AST 28 01/05/2024 1009   ALT 10 01/05/2024 1009   ALT 197 (H) 01/20/2014 0220   ALKPHOS 80 01/05/2024 1009   ALKPHOS 70 01/20/2014 0220   BILITOT 2.4 (H) 01/05/2024 1009   PROT 7.7 01/05/2024 1009   PROT 9.2 (H) 01/20/2014 0220   ALBUMIN  2.4 (L) 01/05/2024 1009   ALBUMIN  3.6 01/20/2014 0220    RADIOGRAPHIC STUDIES: CT CHEST WO CONTRAST Result Date: 12/16/2023 CLINICAL DATA:  Staging of colon cancer.  * Tracking Code: BO * EXAM: CT CHEST WITHOUT CONTRAST TECHNIQUE: Multidetector CT imaging of the chest was performed following the standard protocol without IV contrast.  RADIATION DOSE REDUCTION: This exam was performed according to the departmental dose-optimization program which includes automated exposure control, adjustment of the mA and/or kV according to patient size and/or use of iterative reconstruction technique. COMPARISON:  CTA of the abdomen and pelvis of 12/13/2023. Remote chest CT of 10/06/2010 FINDINGS: Cardiovascular: Aortic atherosclerosis. Tortuous thoracic aorta. Mild cardiomegaly, without pericardial effusion. Three vessel coronary artery calcification. Pulmonary artery enlargement, outflow tract 3.2 cm. Mediastinum/Nodes: No supraclavicular adenopathy. No mediastinal or hilar adenopathy, given limitations of unenhanced CT. Lungs/Pleura: Small bilateral pleural effusions, similar to the recent abdominal CT. Bibasilar dependent atelectasis. Minimal motion degradation. No pulmonary metastasis. Upper Abdomen: Deferred to recent diagnostic CT. Cirrhosis. Cholelithiasis. Upper abdominal ascites. Musculoskeletal: Anasarca. Mild convex right thoracic spine  curvature. IMPRESSION: 1. Mildly motion degraded exam. Given this limitation, no metastatic disease in the chest. 2. Ascites, bilateral pleural effusions, and anasarca, suggesting fluid overload. 3. Pulmonary artery enlargement suggests pulmonary arterial hypertension. 4. Coronary artery atherosclerosis. Aortic Atherosclerosis (ICD10-I70.0). Electronically Signed   By: Rockey Kilts M.D.   On: 12/16/2023 18:03   US  Paracentesis Result Date: 12/14/2023 INDICATION: 68 year old male with a history of alcoholic liver cirrhosis who presented to the ED with lower extremity edema and abdominal distention. Request for diagnostic and therapeutic paracentesis. EXAM: ULTRASOUND GUIDED left PARACENTESIS MEDICATIONS: 1% lidocaine , 7 mL. COMPLICATIONS: None immediate. PROCEDURE: Informed written consent was obtained from the patient after a discussion of the risks, benefits and alternatives to treatment. A timeout was performed  prior to the initiation of the procedure. Initial ultrasound scanning demonstrates a large amount of ascites within the left lower abdominal quadrant. The left lower abdomen was prepped and draped in the usual sterile fashion. 1% lidocaine  was used for local anesthesia. Following this, a 19 gauge, 7-cm, Yueh catheter was introduced. An ultrasound image was saved for documentation purposes. The paracentesis was performed. The catheter was removed and a dressing was applied. The patient tolerated the procedure well without immediate post procedural complication. Patient received post-procedure intravenous albumin ; see nursing notes for details. FINDINGS: A total of approximately 3 L of amber fluid was removed. Samples were sent to the laboratory as requested by the clinical team. IMPRESSION: Successful ultrasound-guided paracentesis yielding 3 liters of peritoneal fluid. PLAN: If the patient eventually requires >/=2 paracenteses in a 30 day period, candidacy for formal evaluation by the [facility] Hypert[facility] will be assessed. Procedure performed by: Sherrilee Bal, PA-C under the supervision of Dr. DELENA Balder Electronically Signed   By: Juliene Balder M.D.   On: 12/14/2023 12:53   IR Angiogram Pelvis Selective Or Supraselective Result Date: 12/14/2023 CLINICAL DATA:  Active GI bleed in the distal transverse colon near an endoscopic clip on recent CTA EXAM: MESENTERIC SELECTIVE ARTERIOGRAPHY ANESTHESIA/SEDATION: Intravenous Fentanyl  25mcg administered by RN during a total moderate (conscious) sedation time of 48 minutes; the patient's level of consciousness and physiological / cardiorespiratory status were monitored continuously by radiology RN under my direct supervision. MEDICATIONS: Lidocaine  1% subcutaneous CONTRAST:  85mL OMNIPAQUE  IOHEXOL  300 MG/ML  SOLN PROCEDURE: The procedure, risks (including but not limited to bleeding, infection, organ damage ), benefits, and  alternatives were explained to the patient and spouse. Questions regarding the procedure were encouraged and answered. The patient understands and consents to the procedure. Right femoral region prepped and draped in usual sterile fashion. Maximal barrier sterile technique was utilized including caps, mask, sterile gowns, sterile gloves, sterile drape, hand hygiene and skin antiseptic. The right common femoral artery was localized under ultrasound. Under real-time ultrasound guidance, the vessel was accessed with a 21-gauge micropuncture needle, exchanged over a 018 guidewire for a transitional dilator, through which a 035 guidewire was advanced. Over this, a 5 [place] vascular sheath was placed, through which a 5 French rim catheter was advanced and used to selectively catheterize the inferior mesenteric artery for selective arteriography in multiple projections. The catheter was then exchanged over the guidewire for a C2 catheter, then utilized to selectively catheterize the superior mesenteric artery for selective arteriography in multiple projections. The catheter and sheath were removed and hemostasis achieved with the aid of the Celt device under ultrasound guidance. The patient tolerated the procedure well. COMPLICATIONS: None immediate FINDINGS: No evidence of active extravasation, early draining vein,  AVM, or other lesion to suggest a site or etiology of the patient's GI bleeding. Endoscopic clip in the distal transverse colon, corresponding to findings from recent CTA. Venous phase confirms patency of the IMV and portal venous system. IMPRESSION: 1. Negative 2-vessel mesenteric arteriogram. No evidence of active extravasation or other focal lesion to suggest etiology of GI bleed. Electronically Signed   By: JONETTA Faes M.D.   On: 12/14/2023 02:08   CT ANGIO GI BLEED Result Date: 12/13/2023 CLINICAL DATA:  Lower GI bleeding * Tracking Code: BO * EXAM: CTA ABDOMEN AND PELVIS WITHOUT AND WITH CONTRAST  TECHNIQUE: Multidetector CT imaging of the abdomen and pelvis was performed using the standard protocol during bolus administration of intravenous contrast. Multiplanar reconstructed images and MIPs were obtained and reviewed to evaluate the vascular anatomy. RADIATION DOSE REDUCTION: This exam was performed according to the departmental dose-optimization program which includes automated exposure control, adjustment of the mA and/or kV according to patient size and/or use of iterative reconstruction technique. CONTRAST:  OMNIPAQUE  IOHEXOL  350 MG/ML SOLN COMPARISON:  None Available. FINDINGS: VASCULAR Normal contour and caliber of the abdominal aorta. No evidence of aneurysm, dissection, or other acute aortic pathology. 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 Mild aortic atherosclerosis. Review of the MIP images confirms the above findings. NON-VASCULAR Lower Chest: Moderate right, small left pleural effusions and associated atelectasis or consolidation. Three-vessel coronary artery calcifications. Hepatobiliary: No solid liver abnormality is seen. Coarse contour of the liver. Gallstones. No biliary ductal dilatation. Pancreas: Unremarkable. No pancreatic ductal dilatation or surrounding inflammatory changes. Spleen: Splenomegaly, maximum span 15.5 cm. Adrenals/Urinary Tract: Adrenal glands are unremarkable. Kidneys are normal, without renal calculi, solid lesion, or hydronephrosis. Foley catheter in the bladder. Stomach/Bowel: Small gastroesophageal varices. The colon is filled with fluid and heterogeneously hyperdense material to the rectum. Endoluminal clip within the redundant distal transverse colon with arterial extravasation arising in this vicinity (series 18, image 107). Contrast enhancing endoluminal mass of the very redundant mid sigmoid colon measuring 2.4 x 2.1 cm (series 18, image 126) Lymphatic: No enlarged  abdominal or pelvic lymph nodes. Reproductive: No mass or other significant abnormality. Other: No abdominal wall hernia. Severe anasarca. Moderate volume ascites throughout the abdomen pelvis. Musculoskeletal: No acute osseous findings. IMPRESSION: 1. Endoluminal clip within the redundant distal transverse colon with arterial extravasation arising in this vicinity, consistent with active GI bleeding. 2. Contrast enhancing endoluminal mass of the very redundant mid sigmoid colon measuring 2.4 x 2.1 cm, highly concerning for primary colon malignancy. 3. The colon is filled with fluid and heterogeneously hyperdense material to the rectum, consistent with blood products. 4. Normal contour and caliber of the abdominal aorta. No evidence of aneurysm, dissection, or other acute aortic pathology. Mild aortic atherosclerosis. 5. Cirrhosis and splenomegaly.  Small gastroesophageal varices. 6. Pleural effusions, severe anasarca, and ascites. 7. Cholelithiasis.  Coronary artery disease. These results will be called to the ordering clinician or representative by the Radiologist Assistant, and communication documented in the PACS or [employer]. Aortic Atherosclerosis (ICD10-I70.0). Electronically Signed   By: Marolyn JONETTA Jaksch M.D.   On: 12/13/2023 20:50   US  Abdomen Limited RUQ (LIVER/GB) Result Date: 12/12/2023 CLINICAL DATA:  [id number] Ascites due to alcoholic cirrhosis (HCC) [id number] EXAM: ULTRASOUND ABDOMEN LIMITED RIGHT UPPER QUADRANT COMPARISON:  Nov 22, 2022 FINDINGS: Gallbladder: Multiple small gallstones. No wall thickening or pericholecystic fluid. No sonographic Murphy's sign noted by sonographer. Common bile duct:  Diameter: 3 mm Liver: Nodular contour with coarsened echogenicity. No focal lesion identified. No intrahepatic biliary ductal dilation. Portal vein is patent on color Doppler imaging with normal direction of blood flow towards the liver. Other: Moderate volume ascites.  Splenomegaly. IMPRESSION: 1.  Cholecystolithiasis.  No changes of acute cholecystitis. 2. Cirrhotic morphology of the liver with sequelae of portal hypertension, including splenomegaly and moderate volume ascites. Continued biannual HCC surveillance recommended. Electronically Signed   By: Rogelia Myers M.D.   On: 12/12/2023 16:05   DG Chest Portable 1 View Result Date: 12/10/2023 CLINICAL DATA:  Chest pain EXAM: PORTABLE CHEST 1 VIEW COMPARISON:  None Available. FINDINGS: Mild right basilar atelectasis. Lungs are otherwise clear. No pneumothorax or pleural effusion. Cardiac size within normal limits. Pulmonary vascularity is normal. Osseous structures are age-appropriate. No acute bone abnormality. IMPRESSION: No active disease. Electronically Signed   By: Dorethia Molt M.D.   On: 12/10/2023 22:20    PERFORMANCE STATUS (ECOG) : 2 - Symptomatic, <50% confined to bed  Review of Systems Unless otherwise noted, a complete review of systems is negative.  Physical Exam General: NAD Cardiovascular: regular rate and rhythm Pulmonary: clear ant fields Abdomen: soft, nontender, + bowel sounds GU: no suprapubic tenderness Extremities: BLE edema, no joint deformities Skin: no rashes Neurological: Weakness but otherwise nonfocal  IMPRESSION: Posthospital follow-up.  Patient is status post XRT.  He is not felt to be a candidate for chemotherapy in light of his advanced cirrhosis.  I met with patient again to discuss goals.  Patient tells me that he is optimistic that he will beat this cancer.  He says he recalls our conversation in the hospital regarding decision making and CODE STATUS.  Patient says that he wants to remain a full code/full scope of treatment.  Patient says since discharging home, he feels he is doing well.  He denies any symptomatic complaints or concerns today.  He says that he is ambulating better at home.  Denies any recent falls.  Denies any recurrent bleeding.  PLAN: - Continue current scope of  treatment - Full code - Follow-up telephone visit 1 to 2 months  Case and plan discussed with Dr. Melanee   Patient expressed understanding and was in agreement with this plan. He also understands that He can call the clinic at any time with any questions, concerns, or complaints.     Time Total: 15 minutes  Visit consisted of counseling and education dealing with the complex and emotionally intense issues of symptom management and palliative care in the setting of serious and potentially life-threatening illness.Greater than 50%  of this time was spent counseling and coordinating care related to the above assessment and plan.  Signed by: Fonda Mower, PhD, NP-C

## 2024-01-05 NOTE — Progress Notes (Signed)
 Patient was recently in the hospital, he was prescribed a couple of new medications. I am going to call his pharmacy, [facility] to see if they have his Symbicort  inhaler, and his singular. Both of his feet up to his knee caps are very swollen. He told me that they usually will go back down in a couple of days. He went into the ER due to being very fatigued and weak.

## 2024-01-06 ENCOUNTER — Telehealth: Payer: Self-pay

## 2024-01-06 DIAGNOSIS — M7989 Other specified soft tissue disorders: Secondary | ICD-10-CM

## 2024-01-06 NOTE — Telephone Encounter (Signed)
 Will send referral to [facility] for ascites and leg swelling once the order is signed.  Will keep note open.

## 2024-01-08 ENCOUNTER — Encounter: Payer: Self-pay | Admitting: Oncology

## 2024-01-10 ENCOUNTER — Encounter: Payer: Self-pay | Admitting: Oncology

## 2024-01-10 NOTE — Telephone Encounter (Signed)
 Referral faxed to [facility]; confirmation received.  Will keep note open to follow up on referral.

## 2024-01-17 ENCOUNTER — Inpatient Hospital Stay: Attending: Oncology

## 2024-01-24 ENCOUNTER — Inpatient Hospital Stay

## 2024-01-24 ENCOUNTER — Inpatient Hospital Stay: Admitting: Hospice and Palliative Medicine

## 2024-01-24 ENCOUNTER — Ambulatory Visit: Admitting: Radiation Oncology

## 2024-01-26 ENCOUNTER — Encounter: Payer: Self-pay | Admitting: Oncology

## 2024-01-26 NOTE — Telephone Encounter (Signed)
 Per Fax scanned into chart today 01/26/24 it reads We spoke to the patient on 01/10/24. Patient 'does not want to deal with his GI issues at this time.  The hospital gave him anxiety and he does not want to do anything further concerning gastro'.  Dr. Onita has spoken to Dr. Melanee. No further follow up needed at this time.

## 2024-01-31 ENCOUNTER — Inpatient Hospital Stay

## 2024-02-07 ENCOUNTER — Ambulatory Visit

## 2024-02-14 ENCOUNTER — Inpatient Hospital Stay: Attending: Oncology

## 2024-03-06 ENCOUNTER — Inpatient Hospital Stay

## 2024-03-06 ENCOUNTER — Inpatient Hospital Stay (HOSPITAL_BASED_OUTPATIENT_CLINIC_OR_DEPARTMENT_OTHER): Admitting: Hospice and Palliative Medicine

## 2024-03-06 DIAGNOSIS — K6389 Other specified diseases of intestine: Secondary | ICD-10-CM

## 2024-03-06 DIAGNOSIS — Z515 Encounter for palliative care: Secondary | ICD-10-CM

## 2024-03-06 NOTE — Progress Notes (Signed)
Unable to reach patient by phone.  Will reschedule.

## 2024-04-06 ENCOUNTER — Ambulatory Visit

## 2024-04-18 ENCOUNTER — Inpatient Hospital Stay
Admission: EM | Admit: 2024-04-18 | Discharge: 2024-04-23 | DRG: 433 | Disposition: A | Discharge status: Hospice | Attending: Obstetrics and Gynecology | Admitting: Obstetrics and Gynecology

## 2024-04-18 ENCOUNTER — Emergency Department

## 2024-04-18 ENCOUNTER — Other Ambulatory Visit: Payer: Self-pay

## 2024-04-18 DIAGNOSIS — K7290 Hepatic failure, unspecified without coma: Secondary | ICD-10-CM | POA: Diagnosis present

## 2024-04-18 DIAGNOSIS — K7031 Alcoholic cirrhosis of liver with ascites: Principal | ICD-10-CM | POA: Diagnosis present

## 2024-04-18 DIAGNOSIS — L89152 Pressure ulcer of sacral region, stage 2: Secondary | ICD-10-CM | POA: Diagnosis present

## 2024-04-18 DIAGNOSIS — F1090 Alcohol use, unspecified, uncomplicated: Secondary | ICD-10-CM | POA: Diagnosis present

## 2024-04-18 DIAGNOSIS — E876 Hypokalemia: Secondary | ICD-10-CM | POA: Diagnosis present

## 2024-04-18 DIAGNOSIS — I10 Essential (primary) hypertension: Secondary | ICD-10-CM | POA: Diagnosis present

## 2024-04-18 DIAGNOSIS — L89891 Pressure ulcer of other site, stage 1: Secondary | ICD-10-CM | POA: Diagnosis present

## 2024-04-18 DIAGNOSIS — I110 Hypertensive heart disease with heart failure: Secondary | ICD-10-CM | POA: Diagnosis present

## 2024-04-18 DIAGNOSIS — Z7982 Long term (current) use of aspirin: Secondary | ICD-10-CM

## 2024-04-18 DIAGNOSIS — I509 Heart failure, unspecified: Secondary | ICD-10-CM | POA: Diagnosis present

## 2024-04-18 DIAGNOSIS — R64 Cachexia: Secondary | ICD-10-CM | POA: Diagnosis present

## 2024-04-18 DIAGNOSIS — J4489 Other specified chronic obstructive pulmonary disease: Secondary | ICD-10-CM | POA: Diagnosis present

## 2024-04-18 DIAGNOSIS — Z923 Personal history of irradiation: Secondary | ICD-10-CM

## 2024-04-18 DIAGNOSIS — Z7951 Long term (current) use of inhaled steroids: Secondary | ICD-10-CM

## 2024-04-18 DIAGNOSIS — J90 Pleural effusion, not elsewhere classified: Secondary | ICD-10-CM | POA: Diagnosis present

## 2024-04-18 DIAGNOSIS — K766 Portal hypertension: Secondary | ICD-10-CM | POA: Diagnosis present

## 2024-04-18 DIAGNOSIS — R600 Localized edema: Principal | ICD-10-CM

## 2024-04-18 DIAGNOSIS — R0602 Shortness of breath: Secondary | ICD-10-CM | POA: Diagnosis not present

## 2024-04-18 DIAGNOSIS — Z515 Encounter for palliative care: Secondary | ICD-10-CM

## 2024-04-18 DIAGNOSIS — C189 Malignant neoplasm of colon, unspecified: Secondary | ICD-10-CM | POA: Diagnosis present

## 2024-04-18 DIAGNOSIS — J449 Chronic obstructive pulmonary disease, unspecified: Secondary | ICD-10-CM | POA: Diagnosis present

## 2024-04-18 DIAGNOSIS — Z8249 Family history of ischemic heart disease and other diseases of the circulatory system: Secondary | ICD-10-CM

## 2024-04-18 DIAGNOSIS — F1010 Alcohol abuse, uncomplicated: Secondary | ICD-10-CM | POA: Diagnosis present

## 2024-04-18 DIAGNOSIS — I8500 Esophageal varices without bleeding: Secondary | ICD-10-CM | POA: Insufficient documentation

## 2024-04-18 DIAGNOSIS — Z5982 Transportation insecurity: Secondary | ICD-10-CM

## 2024-04-18 DIAGNOSIS — Z79899 Other long term (current) drug therapy: Secondary | ICD-10-CM

## 2024-04-18 DIAGNOSIS — R5381 Other malaise: Secondary | ICD-10-CM | POA: Diagnosis present

## 2024-04-18 DIAGNOSIS — Z91148 Patient's other noncompliance with medication regimen for other reason: Secondary | ICD-10-CM

## 2024-04-18 DIAGNOSIS — D509 Iron deficiency anemia, unspecified: Secondary | ICD-10-CM | POA: Diagnosis present

## 2024-04-18 DIAGNOSIS — K7460 Unspecified cirrhosis of liver: Secondary | ICD-10-CM | POA: Diagnosis present

## 2024-04-18 DIAGNOSIS — I8510 Secondary esophageal varices without bleeding: Secondary | ICD-10-CM | POA: Diagnosis present

## 2024-04-18 LAB — CBC
HCT: 25.8 % — ABNORMAL LOW (ref 39.0–52.0)
Hemoglobin: 8.6 g/dL — ABNORMAL LOW (ref 13.0–17.0)
MCH: 32 pg (ref 26.0–34.0)
MCHC: 33.3 g/dL (ref 30.0–36.0)
MCV: 95.9 fL (ref 80.0–100.0)
Platelets: 189 K/uL (ref 150–400)
RBC: 2.69 MIL/uL — ABNORMAL LOW (ref 4.22–5.81)
RDW: 17.4 % — ABNORMAL HIGH (ref 11.5–15.5)
WBC: 5.2 K/uL (ref 4.0–10.5)
nRBC: 0 % (ref 0.0–0.2)

## 2024-04-18 LAB — BASIC METABOLIC PANEL WITH GFR
Anion gap: 8 (ref 5–15)
BUN: 7 mg/dL — ABNORMAL LOW (ref 8–23)
CO2: 24 mmol/L (ref 22–32)
Calcium: 7.5 mg/dL — ABNORMAL LOW (ref 8.9–10.3)
Chloride: 97 mmol/L — ABNORMAL LOW (ref 98–111)
Creatinine, Ser: 0.48 mg/dL — ABNORMAL LOW (ref 0.61–1.24)
GFR, Estimated: >60 mL/min (ref >60)
Glucose, Bld: 111 mg/dL — ABNORMAL HIGH (ref 70–99)
Potassium: 3.5 mmol/L (ref 3.5–5.1)
Sodium: 129 mmol/L — ABNORMAL LOW (ref 135–145)

## 2024-04-18 LAB — HEPATIC FUNCTION PANEL
ALT: 14 U/L (ref 0–44)
AST: 34 U/L (ref 15–41)
Albumin: 1.7 g/dL — ABNORMAL LOW (ref 3.5–5.0)
Alkaline Phosphatase: 59 U/L (ref 38–126)
Bilirubin, Direct: 1.5 mg/dL — ABNORMAL HIGH (ref 0.0–0.2)
Indirect Bilirubin: 1.6 mg/dL — ABNORMAL HIGH (ref 0.3–0.9)
Total Bilirubin: 3.1 mg/dL — ABNORMAL HIGH (ref 0.0–1.2)
Total Protein: 7.8 g/dL (ref 6.5–8.1)

## 2024-04-18 LAB — BRAIN NATRIURETIC PEPTIDE: B Natriuretic Peptide: 154.9 pg/mL — ABNORMAL HIGH (ref 0.0–100.0)

## 2024-04-18 MED ORDER — ALBUMIN HUMAN 25 % IV SOLN
INTRAVENOUS | Status: AC
  Filled 2024-04-18: qty 50

## 2024-04-18 MED ORDER — IPRATROPIUM-ALBUTEROL 0.5-2.5 (3) MG/3ML IN SOLN
3.0000 mL | Freq: Once | RESPIRATORY_TRACT | Status: AC
  Administered 2024-04-18: 3 mL via RESPIRATORY_TRACT
  Filled 2024-04-18: qty 3

## 2024-04-18 MED ORDER — OXYCODONE-ACETAMINOPHEN 5-325 MG PO TABS
1.0000 | ORAL_TABLET | Freq: Once | ORAL | Status: AC
  Administered 2024-04-18: 1 via ORAL
  Filled 2024-04-18: qty 1

## 2024-04-18 MED ORDER — ALBUMIN HUMAN 25 % IV SOLN
25.0000 g | Freq: Once | INTRAVENOUS | Status: AC
  Administered 2024-04-18: 25 g via INTRAVENOUS
  Filled 2024-04-18: qty 100

## 2024-04-18 NOTE — ED Triage Notes (Signed)
 See first nurse note: Patient also complaining of shortness of breath since this afternoon.

## 2024-04-18 NOTE — ED Provider Notes (Signed)
 [facility] Provider Note   Event Date/Time   First MD Initiated Contact with Patient 04/18/24 1752     (approximate) History  Leg Swelling and Shortness of Breath  HPI Joel Harrell is a 68 y.o. 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  And hypertension who presents complaining of increasing shortness of breath and bilateral lower extremity edema over the past month.  Also endorses abdominal distention that has been worsening and is now tense.  Patient states that he has been taking his medications on time and as prescribed including his diuretics.  Patient requesting a paracentesis as he said this was done 5 weeks ago and he felt better until this time. ROS: Patient currently denies any vision changes, tinnitus, difficulty speaking, facial droop, sore throat, chest pain, abdominal pain, nausea/vomiting/diarrhea, dysuria, or weakness/numbness/paresthesias in any extremity   Physical Exam  Triage Vital Signs: ED Triage Vitals  Encounter Vitals Group     BP 04/18/24 1651 (!) 148/93     Girls Systolic BP Percentile --      Girls Diastolic BP Percentile --      Boys Systolic BP Percentile --      Boys Diastolic BP Percentile --      Pulse Rate 04/18/24 1651 (!) 119     Resp 04/18/24 1651 (!) 22     Temp 04/18/24 1651 97.8 F (36.6 C)     Temp Source 04/18/24 1651 Oral     SpO2 04/18/24 1651 100 %     Weight --      Height 04/18/24 1652 6' 3 (1.905 m)     Head Circumference --      Peak Flow --      Pain Score 04/18/24 1651 9     Pain Loc --      Pain Education --      Exclude from Growth Chart --    Most recent vital signs: Vitals:   04/18/24 2100 04/18/24 2200  BP: 135/77 (!) 142/73  Pulse: (!) 117 (!) 119  Resp: 13 14  Temp:    SpO2: 100% 100%   General: Awake, oriented x4. CV:  Good peripheral perfusion. Resp:  Normal effort. Abd:  Distention with  positive fluid wave.  Nontender to palpation Other:  Elderly African-American male resting comfortably in no acute distress ED Results / Procedures / Treatments  Labs (all labs ordered are listed, but only abnormal results are displayed) Labs Reviewed  BASIC METABOLIC PANEL WITH GFR - Abnormal; Notable for the following components:      Result Value   Sodium 129 (*)    Chloride 97 (*)    Glucose, Bld 111 (*)    BUN 7 (*)    Creatinine, Ser 0.48 (*)    Calcium 7.5 (*)    All other components within normal limits  CBC - Abnormal; Notable for the following components:   RBC 2.69 (*)    Hemoglobin 8.6 (*)    HCT 25.8 (*)    RDW 17.4 (*)    All other components within normal limits  BRAIN NATRIURETIC PEPTIDE - Abnormal; Notable for the following components:   B Natriuretic Peptide 154.9 (*)    All other components within normal limits  HEPATIC FUNCTION PANEL - Abnormal; Notable for the following components:   Albumin  1.7 (*)    Total Bilirubin 3.1 (*)    Bilirubin, Direct 1.5 (*)  Indirect Bilirubin 1.6 (*)    All other components within normal limits   EKG ED ECG REPORT I, Artist MARLA Kerns, the attending physician, personally viewed and interpreted this ECG. Date: 04/18/2024 EKG Time: 1655 Rate: 119 Rhythm: Tachycardic sinus rhythm QRS Axis: normal Intervals: normal ST/T Wave abnormalities: normal Narrative Interpretation: Tachycardic sinus rhythm.  No evidence of acute ischemia RADIOLOGY ED MD interpretation: 2 view chest x-ray shows moderate left and small right pleural effusion with bibasilar atelectasis or infiltration - All radiology independently interpreted and agree with radiology assessment Official radiology report(s): DG Chest 2 View Result Date: 04/18/2024 CLINICAL DATA:  Shortness of breath EXAM: CHEST - 2 VIEW COMPARISON:  CT chest 12/16/2023.  Chest radiograph 12/10/2023 FINDINGS: Shallow inspiration. Heart size and pulmonary vascularity are normal for  technique. There is interval development of a moderate left pleural effusion with basilar and perihilar opacities. This may represent compressive atelectasis and/or pneumonia. No pneumothorax. Small right pleural effusion with basilar atelectasis or infiltration. Degenerative changes in the spine. IMPRESSION: Interval development of moderate left and small right pleural effusion with bilateral basilar atelectasis or infiltration, also greater on the left. Electronically Signed   By: Elsie Gravely M.D.   On: 04/18/2024 18:29   PROCEDURES: Critical Care performed: No Procedures MEDICATIONS ORDERED IN ED: Medications  oxyCODONE -acetaminophen  (PERCOCET/ROXICET) 5-325 MG per tablet 1 tablet (1 tablet Oral Given 04/18/24 1852)  ipratropium-albuterol  (DUONEB) 0.5-2.5 (3) MG/3ML nebulizer solution 3 mL (3 mLs Nebulization Given 04/18/24 1910)  albumin  human 25 % solution 25 g (12.5 g Intravenous Not Given 04/18/24 2233)   IMPRESSION / MDM / ASSESSMENT AND PLAN / ED COURSE  I reviewed the triage vital signs and the nursing notes.                             The patient is on the cardiac monitor to evaluate for evidence of arrhythmia and/or significant heart rate changes. Patient's presentation is most consistent with acute presentation with potential threat to life or bodily function. Patient is a 68 year old male with the above-stated past medical history that presents for symptoms of volume overload including shortness of breath, abdominal distention, and bilateral lower extremity edema in the setting of alcoholic cirrhosis.  According to family at bedside, patient continues to drink alcohol regularly.  Patient is also refusing all at home care including physical therapy and hospice. DDx: Pleural effusion, pulmonary edema, CHF, ascites, kidney failure Plan: CBC, CMP, BNP, chest x-ray, EKG Paracentesis at bedside  Reassessment: Patient was drained of 9 L of fluid from the abdomen and replaced with 25  g of albumin .  Patient has new bilateral pleural effusions however shows no signs of respiratory distress or hypoxia at this time.  Patient is mildly tachycardic which has improved after paracentesis. Prior to discharge, patient's family has stated that due to his continued drinking and inability to engage meaningfully with his physical and occupational therapist at home, they are unable to continue caring for him however they ask that he be discharged in the morning as they have more family that can come and help them at home.  Plan for boarding overnight with physical Occupational Therapy evaluation in the morning and TOC consult.  Dispo: Boarder   FINAL CLINICAL IMPRESSION(S) / ED DIAGNOSES   Final diagnoses:  Peripheral edema  Ascites due to alcoholic cirrhosis (HCC)  Shortness of breath  Pleural effusion   Rx / DC Orders   ED Discharge  Orders     None      Note:  This document was prepared using Dragon voice recognition software and may include unintentional dictation errors.   Jossie Artist POUR, MD 04/18/24 [id number]

## 2024-04-18 NOTE — ED Notes (Signed)
 Pt was found to be incontinent of urine and linens were changed, pericare was performed, a chuck pad, gown and new blankets were applied. Pt was edematous to their arms, back and legs with +3 pitting edema. Pt's abdomen was distended and taught. Pt's bed was returned to lowest, locked position with call bell in reach.

## 2024-04-18 NOTE — ED Notes (Addendum)
 Pt stated that they have had generalized swelling x1 month. Pt c/o pain 8/10 pain in their groin area. Pt denies any medicines to help with swelling and pain. Pt's breathing was labored, and pt had to take a breath every 2-3 words. Pt has a Hx of HTN, asthma.

## 2024-04-18 NOTE — ED Triage Notes (Signed)
 First Nurse Note: Patient to ED via [facility] from home for bilateral leg swelling and stomach. PT requesting thoracentesis- last done 5 weeks ago.   147/96 99% RA 97.7 130 cbg

## 2024-04-18 NOTE — ED Notes (Signed)
 Myles Kerns M.D. of output of 7500 ml's from thoracentesis, advised by him to keep it draining

## 2024-04-18 NOTE — Discharge Instructions (Addendum)
 Please double your dose of lasix  (80mg ) for the next 3 days prior to follow up with your PCP

## 2024-04-19 ENCOUNTER — Emergency Department

## 2024-04-19 DIAGNOSIS — K766 Portal hypertension: Secondary | ICD-10-CM

## 2024-04-19 DIAGNOSIS — K7031 Alcoholic cirrhosis of liver with ascites: Secondary | ICD-10-CM

## 2024-04-19 DIAGNOSIS — K7290 Hepatic failure, unspecified without coma: Secondary | ICD-10-CM

## 2024-04-19 DIAGNOSIS — J90 Pleural effusion, not elsewhere classified: Secondary | ICD-10-CM | POA: Diagnosis not present

## 2024-04-19 DIAGNOSIS — J449 Chronic obstructive pulmonary disease, unspecified: Secondary | ICD-10-CM

## 2024-04-19 DIAGNOSIS — F1090 Alcohol use, unspecified, uncomplicated: Secondary | ICD-10-CM

## 2024-04-19 DIAGNOSIS — C187 Malignant neoplasm of sigmoid colon: Secondary | ICD-10-CM

## 2024-04-19 DIAGNOSIS — D509 Iron deficiency anemia, unspecified: Secondary | ICD-10-CM

## 2024-04-19 LAB — CBC
HCT: 26.6 % — ABNORMAL LOW (ref 39.0–52.0)
Hemoglobin: 8.2 g/dL — ABNORMAL LOW (ref 13.0–17.0)
MCH: 31.1 pg (ref 26.0–34.0)
MCHC: 30.8 g/dL (ref 30.0–36.0)
MCV: 100.8 fL — ABNORMAL HIGH (ref 80.0–100.0)
Platelets: 143 K/uL — ABNORMAL LOW (ref 150–400)
RBC: 2.64 MIL/uL — ABNORMAL LOW (ref 4.22–5.81)
RDW: 17.7 % — ABNORMAL HIGH (ref 11.5–15.5)
WBC: 4.4 K/uL (ref 4.0–10.5)
nRBC: 0 % (ref 0.0–0.2)

## 2024-04-19 LAB — COMPREHENSIVE METABOLIC PANEL WITH GFR
ALT: 11 U/L (ref 0–44)
AST: 30 U/L (ref 15–41)
Albumin: 1.6 g/dL — ABNORMAL LOW (ref 3.5–5.0)
Alkaline Phosphatase: 47 U/L (ref 38–126)
Anion gap: 8 (ref 5–15)
BUN: 7 mg/dL — ABNORMAL LOW (ref 8–23)
CO2: 23 mmol/L (ref 22–32)
Calcium: 7.2 mg/dL — ABNORMAL LOW (ref 8.9–10.3)
Chloride: 96 mmol/L — ABNORMAL LOW (ref 98–111)
Creatinine, Ser: 0.68 mg/dL (ref 0.61–1.24)
GFR, Estimated: >60 mL/min (ref >60)
Glucose, Bld: 111 mg/dL — ABNORMAL HIGH (ref 70–99)
Potassium: 3.2 mmol/L — ABNORMAL LOW (ref 3.5–5.1)
Sodium: 127 mmol/L — ABNORMAL LOW (ref 135–145)
Total Bilirubin: 2.2 mg/dL — ABNORMAL HIGH (ref 0.0–1.2)
Total Protein: 6.6 g/dL (ref 6.5–8.1)

## 2024-04-19 LAB — PROTIME-INR
INR: 1.7 — ABNORMAL HIGH (ref 0.8–1.2)
Prothrombin Time: 21 s — ABNORMAL HIGH (ref 11.4–15.2)

## 2024-04-19 LAB — MAGNESIUM: Magnesium: 1.5 mg/dL — ABNORMAL LOW (ref 1.7–2.4)

## 2024-04-19 MED ORDER — ADULT MULTIVITAMIN W/MINERALS CH
1.0000 | ORAL_TABLET | Freq: Every day | ORAL | Status: DC
  Administered 2024-04-19 – 2024-04-20 (×2): 1 via ORAL
  Filled 2024-04-19 (×2): qty 1

## 2024-04-19 MED ORDER — MONTELUKAST SODIUM 10 MG PO TABS
10.0000 mg | ORAL_TABLET | Freq: Every day | ORAL | Status: DC
Start: 2024-04-19 — End: 2024-04-22
  Administered 2024-04-19 – 2024-04-21 (×3): 10 mg via ORAL
  Filled 2024-04-19 (×3): qty 1

## 2024-04-19 MED ORDER — ENOXAPARIN SODIUM 40 MG/0.4ML IJ SOSY
40.0000 mg | PREFILLED_SYRINGE | Freq: Every day | INTRAMUSCULAR | Status: DC
  Administered 2024-04-19 – 2024-04-21 (×3): 40 mg via SUBCUTANEOUS
  Filled 2024-04-19 (×3): qty 0.4

## 2024-04-19 MED ORDER — POLYETHYLENE GLYCOL 3350 17 G PO PACK
17.0000 g | PACK | Freq: Every day | ORAL | Status: DC | PRN
Start: 2024-04-19 — End: 2024-04-23

## 2024-04-19 MED ORDER — IOHEXOL 350 MG/ML SOLN
75.0000 mL | Freq: Once | INTRAVENOUS | Status: AC | PRN
  Administered 2024-04-19: 75 mL via INTRAVENOUS

## 2024-04-19 MED ORDER — PANTOPRAZOLE SODIUM 40 MG PO TBEC
40.0000 mg | DELAYED_RELEASE_TABLET | Freq: Two times a day (BID) | ORAL | Status: DC
Start: 2024-04-19 — End: 2024-04-22
  Administered 2024-04-19 – 2024-04-22 (×7): 40 mg via ORAL
  Filled 2024-04-19 (×7): qty 1

## 2024-04-19 MED ORDER — THIAMINE MONONITRATE 100 MG PO TABS
100.0000 mg | ORAL_TABLET | Freq: Every day | ORAL | Status: DC
Start: 2024-04-19 — End: 2024-04-20
  Administered 2024-04-19 – 2024-04-20 (×2): 100 mg via ORAL
  Filled 2024-04-19 (×2): qty 1

## 2024-04-19 MED ORDER — FUROSEMIDE 40 MG PO TABS
40.0000 mg | ORAL_TABLET | Freq: Every day | ORAL | Status: DC
Start: 2024-04-19 — End: 2024-04-22
  Administered 2024-04-19 – 2024-04-22 (×4): 40 mg via ORAL
  Filled 2024-04-19 (×4): qty 1

## 2024-04-19 MED ORDER — BISACODYL 5 MG PO TBEC: 10.0000 mg | DELAYED_RELEASE_TABLET | Freq: Every day | ORAL | Status: DC | PRN

## 2024-04-19 MED ORDER — LISINOPRIL 10 MG PO TABS
20.0000 mg | ORAL_TABLET | Freq: Every day | ORAL | Status: DC
Start: 2024-04-19 — End: 2024-04-20
  Administered 2024-04-19 – 2024-04-20 (×2): 20 mg via ORAL
  Filled 2024-04-19 (×2): qty 2

## 2024-04-19 MED ORDER — ACETAMINOPHEN 650 MG RE SUPP: 650.0000 mg | Freq: Four times a day (QID) | RECTAL | Status: DC | PRN

## 2024-04-19 MED ORDER — FOLIC ACID 1 MG PO TABS
1.0000 mg | ORAL_TABLET | Freq: Every day | ORAL | Status: DC
Start: 2024-04-19 — End: 2024-04-20
  Administered 2024-04-19 – 2024-04-20 (×2): 1 mg via ORAL
  Filled 2024-04-19 (×2): qty 1

## 2024-04-19 MED ORDER — ASPIRIN 81 MG PO TBEC
81.0000 mg | DELAYED_RELEASE_TABLET | Freq: Every day | ORAL | Status: DC
Start: 2024-04-19 — End: 2024-04-22
  Administered 2024-04-19 – 2024-04-22 (×4): 81 mg via ORAL
  Filled 2024-04-19 (×4): qty 1

## 2024-04-19 MED ORDER — FERROUS SULFATE 325 (65 FE) MG PO TABS
325.0000 mg | ORAL_TABLET | Freq: Two times a day (BID) | ORAL | Status: DC
Start: 2024-04-19 — End: 2024-04-22
  Administered 2024-04-19 – 2024-04-22 (×7): 325 mg via ORAL
  Filled 2024-04-19 (×7): qty 1

## 2024-04-19 MED ORDER — ACETAMINOPHEN 325 MG PO TABS: 650.0000 mg | ORAL_TABLET | Freq: Four times a day (QID) | ORAL | Status: DC | PRN

## 2024-04-19 MED ORDER — OXYCODONE-ACETAMINOPHEN 5-325 MG PO TABS
1.0000 | ORAL_TABLET | Freq: Once | ORAL | Status: AC
  Administered 2024-04-19: 1 via ORAL
  Filled 2024-04-19: qty 1

## 2024-04-19 MED ORDER — SODIUM CHLORIDE 0.9% FLUSH
3.0000 mL | Freq: Two times a day (BID) | INTRAVENOUS | Status: DC
  Administered 2024-04-19 – 2024-04-23 (×8): 3 mL via INTRAVENOUS

## 2024-04-19 MED ORDER — ALBUTEROL SULFATE (2.5 MG/3ML) 0.083% IN NEBU: 2.5000 mg | INHALATION_SOLUTION | Freq: Four times a day (QID) | RESPIRATORY_TRACT | Status: DC | PRN

## 2024-04-19 MED ORDER — FLUTICASONE FUROATE-VILANTEROL 200-25 MCG/ACT IN AEPB
1.0000 | INHALATION_SPRAY | Freq: Every day | RESPIRATORY_TRACT | Status: DC
  Administered 2024-04-20 – 2024-04-23 (×4): 1 via RESPIRATORY_TRACT
  Filled 2024-04-19 (×2): qty 28

## 2024-04-19 NOTE — H&P (Signed)
 History and Physical   [name] FMW:[id number] DOB: 06-22-1956 DOA: 04/18/2024  PCP: Patient, No Pcp Per   Patient coming from: Home  Chief Complaint: Edema, shortness of breath  HPI: Joel Harrell is a 68 y.o. male with medical history significant of hypertension, anemia, cirrhosis, portal hypertension, ascites, hepatic cephalopathy, colon cancer, COPD, alcohol use presenting with worsening shortness of breath and edema.  Patient initially presented 10/7 with 1 month of worsening lower extremity edema and shortness of breath.  Also worsening abdominal distention.  Taking medications as prescribed.  Found to have significant ascites and underwent paracentesis with 9 L removed and albumin  given.  Family reports patient refusing home health and continuing to drink with decreased ability for family to help care for him at home.  PT/OT consulted in the ED.  Patient noted to have persistent shortness of breath, tachycardia, edema in the ED.  Some component of this is chronic but some worse per patient.  Denies fevers, chills, chest pain, abdominal pain, constipation, diarrhea, nausea, vomiting.    ED Course: Vital signs in the ED notable for heart rate in the 100s-110s.  Blood pressure in the 100s-170s systolic.  Lab workup notable yesterday for BMP with sodium stable 1.9, chloride 97, glucose 111, calcium 10.5.  LFTs with albumin  1.7, protein 2.1.  CBC with hemoglobin stable at 8.6.  BNP stable only mildly elevated at 154.  Chest x-ray with moderate left and small right pleural effusion with atelectasis versus infiltrate.  CTA PE study done today showed no evidence of PE but did show bilateral large pleural effusions with possible underlying edema versus pneumonia.  Also noted was a 1.4 cm ground glass lesion with follow-up recommended.  Patient has received no medications and the above paracentesis plus albumin .  With persistent shortness of breath, edema, tachycardia, admission requested  for further treatment and evaluation.  Review of Systems: As per HPI otherwise all other systems reviewed and are negative.  Past Medical History:  Diagnosis Date   Asthma    CHF (congestive heart failure) (HCC)    Hypertension     Past Surgical History:  Procedure Laterality Date   COLONOSCOPY N/A 12/13/2023   Procedure: COLONOSCOPY;  Surgeon: Jinny Carmine, MD;  [facility] END[facility];  Service: Endoscopy;  Laterality: N/A;   ESOPHAGOGASTRODUODENOSCOPY N/A 12/12/2023   Procedure: EGD (ESOPHAGOGASTRODUODENOSCOPY);  Surgeon: Onita Elspeth Sharper, DO;  Location: [facility] ENDOSCOPY;  Service: Gastroenterology;  Laterality: N/A;   HEMOSTASIS CLIP PLACEMENT  12/13/2023   Procedure: CONTROL OF HEMORRHAGE, GI TRACT, ENDOSCOPIC, BY CLIPPING OR OVERSEWING;  Surgeon: Jinny Carmine, MD;  Location: [facility] ENDOSCOPY;  Service: Endoscopy;;   IR ANGIOGRAM PELVIS SELECTIVE OR SUPRASELECTIVE  12/13/2023   POLYPECTOMY  12/13/2023   Procedure: POLYPECTOMY, INTESTINE;  Surgeon: Jinny Carmine, MD;  Location: [facility] ENDOSCOPY;  Service: Endoscopy;;    Social History  reports that he has never smoked. He has never used smokeless tobacco. He reports current alcohol use of about 10.0 standard drinks of alcohol per week. He reports that he does not use drugs.  No Known Allergies  Family History  Problem Relation Age of Onset   Heart attack Father    Cancer Mother   Reviewed on admission  Prior to Admission medications   Medication Sig Start Date End Date Taking? Authorizing Provider  albuterol  (VENTOLIN  HFA) 108 (90 Base) MCG/ACT inhaler Inhale 2 puffs into the lungs every 6 (six) hours as needed for wheezing or shortness of breath. 12/07/19  Yes Gordan Huxley, MD  aspirin   EC 81 MG tablet Take 81 mg by mouth daily. Swallow whole.   Yes [provider]  bisacodyl  (DULCOLAX) 5 MG EC tablet Take 2 tablets (10 mg total) by mouth daily as needed for moderate constipation. 12/22/23  Yes Raynah Gomes, Natalie, DO   budesonide -formoterol  (SYMBICORT ) 160-4.5 MCG/ACT inhaler Inhale 2 puffs into the lungs in the morning and at bedtime. 12/31/23  Yes Melanee Annah BROCKS, MD  ferrous sulfate  325 (65 FE) MG EC tablet Take 1 tablet (325 mg total) by mouth 2 (two) times daily. 11/23/22 04/19/24 Yes Leotis Bogus, MD  folic acid  (FOLVITE ) 1 MG tablet Take 1 tablet (1 mg total) by mouth daily. 11/24/22  Yes Leotis Bogus, MD  furosemide  (LASIX ) 40 MG tablet Take 1 tablet (40 mg total) by mouth daily. 12/22/23  Yes Joson Sapp, Natalie, DO  lisinopril  (ZESTRIL ) 20 MG tablet Take 20 mg by mouth daily.   Yes [provider]  montelukast  (SINGULAIR ) 10 MG tablet Take 1 tablet (10 mg total) by mouth at bedtime. 12/28/15 04/19/24 Yes Brain Redell RAMAN, MD  Multiple Vitamin (MULTIVITAMIN WITH MINERALS) TABS tablet Take 1 tablet by mouth daily.   Yes [provider]  polyethylene glycol powder (GLYCOLAX /MIRALAX ) 17 GM/SCOOP powder Take 17 g by mouth daily as needed for mild constipation. 12/22/23  Yes Danelle Curiale, Natalie, DO  pantoprazole  (PROTONIX ) 40 MG tablet Take 1 tablet (40 mg total) by mouth 2 (two) times daily. Patient not taking: Reported on 04/19/2024 12/22/23   Kyiah Canepa, Natalie, DO  thiamine  (VITAMIN B-1) 100 MG tablet Take 1 tablet (100 mg total) by mouth daily. Patient not taking: Reported on 04/19/2024 11/24/22   Leotis Bogus, MD    Physical Exam: Vitals:   04/18/24 2200 04/19/24 0655 04/19/24 0800 04/19/24 1200  BP: (!) 142/73 120/81 139/81 109/68  Pulse: (!) 119 (!) 111 (!) 111 (!) 114  Resp: 14 13 12 18   Temp:      TempSrc:      SpO2: 100% 100% 100% 100%  Height:        Physical Exam Constitutional:      General: He is not in acute distress.    Appearance: Normal appearance.  HENT:     Head: Normocephalic and atraumatic.     Mouth/Throat:     Mouth: Mucous membranes are moist.     Pharynx: Oropharynx is clear.  Eyes:     Extraocular Movements: Extraocular movements intact.     Pupils:  Pupils are equal, round, and reactive to light.  Cardiovascular:     Rate and Rhythm: Regular rhythm. Tachycardia present.     Pulses: Normal pulses.     Heart sounds: Normal heart sounds.  Pulmonary:     Effort: Pulmonary effort is normal. No respiratory distress.     Breath sounds: Decreased breath sounds present.  Abdominal:     General: Bowel sounds are normal. There is no distension.     Palpations: Abdomen is soft.     Tenderness: There is no abdominal tenderness.  Musculoskeletal:        General: No swelling or deformity.     Right lower leg: Edema present.     Left lower leg: Edema present.  Skin:    General: Skin is warm and dry.  Neurological:     General: No focal deficit present.     Mental Status: Mental status is at baseline.    Labs on Admission: I have personally reviewed following labs and imaging studies  CBC: Recent Labs  Lab 04/18/24 1653  WBC 5.2  HGB 8.6*  HCT 25.8*  MCV 95.9  PLT 189    Basic Metabolic Panel: Recent Labs  Lab 04/18/24 1653  NA 129*  K 3.5  CL 97*  CO2 24  GLUCOSE 111*  BUN 7*  CREATININE 0.48*  CALCIUM 7.5*    GFR: CrCl cannot be calculated (Unknown ideal weight.).  Liver Function Tests: Recent Labs  Lab 04/18/24 1653  AST 34  ALT 14  ALKPHOS 59  BILITOT 3.1*  PROT 7.8  ALBUMIN  1.7*    Urine analysis:    Component Value Date/Time   COLORURINE AMBER (A) 12/13/2023 1616   APPEARANCEUR CLEAR (A) 12/13/2023 1616   LABSPEC 1.019 12/13/2023 1616   PHURINE 5.0 12/13/2023 1616   GLUCOSEU NEGATIVE 12/13/2023 1616   HGBUR MODERATE (A) 12/13/2023 1616   BILIRUBINUR NEGATIVE 12/13/2023 1616   KETONESUR NEGATIVE 12/13/2023 1616   PROTEINUR 30 (A) 12/13/2023 1616   NITRITE NEGATIVE 12/13/2023 1616   LEUKOCYTESUR NEGATIVE 12/13/2023 1616    Radiological Exams on Admission: CT Angio Chest Pulmonary Embolism (PE) W or WO Contrast Result Date: 04/19/2024 CLINICAL DATA:  Pulmonary embolus suspected with high  probability. Shortness of breath and leg swelling. EXAM: CT ANGIOGRAPHY CHEST WITH CONTRAST TECHNIQUE: Multidetector CT imaging of the chest was performed using the standard protocol during bolus administration of intravenous contrast. Multiplanar CT image reconstructions and MIPs were obtained to evaluate the vascular anatomy. RADIATION DOSE REDUCTION: This exam was performed according to the departmental dose-optimization program which includes automated exposure control, adjustment of the mA and/or kV according to patient size and/or use of iterative reconstruction technique. CONTRAST:  75mL OMNIPAQUE  IOHEXOL  350 MG/ML SOLN COMPARISON:  Chest radiograph 04/18/2024.  CT chest 12/16/2023 FINDINGS: Cardiovascular: Technically adequate study with moderately good opacification of the central pulmonary arteries. Moderate to significant motion artifact in some areas. As visualized, no large central pulmonary emboli are demonstrated. Peripheral emboli could be obscured due to technique. Cardiac enlargement. No pericardial effusions. Normal caliber thoracic aorta. No aortic dissection. Calcification of the aorta and coronary arteries. Great vessel origins are patent. Mediastinum/Nodes: Thyroid gland is unremarkable. Esophagus is decompressed. No significant lymphadenopathy. Lungs/Pleura: Large bilateral pleural effusions, slightly greater on the left. Atelectasis or consolidation in both lower lungs may represent pneumonia or compressive atelectasis. Patchy airspace disease in both lungs could represent multifocal pneumonia or edema. There is a focal nodular ground-glass lesion in the right apex measuring 1.4 cm diameter. Given the presence of infiltrates elsewhere in the lungs, this is likely infectious or inflammatory but follow-up after resolution of acute process is recommended to exclude underlying ground-glass lesion. No pneumothorax. Upper Abdomen: Hepatic cirrhosis with enlarged spleen and prominent upper  abdominal ascites. Cholelithiasis. Musculoskeletal: Degenerative changes in the spine. No acute bony abnormalities. Review of the MIP images confirms the above findings. IMPRESSION: 1. No evidence of significant central pulmonary embolus. Visualization of subsegmental vessels is technically limited. 2. Large bilateral pleural effusions with basilar atelectasis or consolidation. Patchy airspace infiltrates in both lungs may represent pneumonia or edema. 3. 1.4 cm nodular ground-glass lesion in the right apex is likely infiltrative/infectious but follow-up is recommended to exclude underlying ground-glass lesion. Initial follow-up with CT at 6 months is recommended to confirm persistence. If persistent, repeat CT is recommended every 2 years until 5 years of stability has been established. This recommendation follows the consensus statement: Guidelines for Management of Incidental Pulmonary Nodules Detected on CT Images: From the Fleischner Society 2017; Radiology 2017; 284:228-243.  4. Hepatic cirrhosis with splenic enlargement and prominent upper abdominal ascites. 5. Cholelithiasis. Electronically Signed   By: Elsie Gravely M.D.   On: 04/19/2024 15:25   DG Chest 2 View Result Date: 04/18/2024 CLINICAL DATA:  Shortness of breath EXAM: CHEST - 2 VIEW COMPARISON:  CT chest 12/16/2023.  Chest radiograph 12/10/2023 FINDINGS: Shallow inspiration. Heart size and pulmonary vascularity are normal for technique. There is interval development of a moderate left pleural effusion with basilar and perihilar opacities. This may represent compressive atelectasis and/or pneumonia. No pneumothorax. Small right pleural effusion with basilar atelectasis or infiltration. Degenerative changes in the spine. IMPRESSION: Interval development of moderate left and small right pleural effusion with bilateral basilar atelectasis or infiltration, also greater on the left. Electronically Signed   By: Elsie Gravely M.D.   On: 04/18/2024  18:29   EKG: Independently reviewed.  EKG performed yesterday showed sinus tachycardia at 119 bpm.  Baseline artifact and baseline wander noted.  Possible PVCs noted but difficult to interpret with above artifact and wander.  Assessment/Plan Principal Problem:   Pleural effusion Active Problems:   Decompensated hepatic cirrhosis (HCC)   Ascites due to alcoholic cirrhosis (HCC)   Alcohol use disorder   Hypertension   COPD (chronic obstructive pulmonary disease) (HCC)   Iron  deficiency anemia   Portal hypertension (HCC)   Colon cancer (HCC)   Tachycardia Bilateral pleural effusions Edema > Patient presenting with worsening shortness of breath and edema.  Has had some chronic edema in the setting of his cirrhosis but has been worse in the last month.  Also worsening shortness of breath.  No hypoxia so far in the ED. > Workup in the ED included chest x-ray and CT EP study.  No evidence of PE but does have large bilateral pleural effusions with possible underlying edema versus pneumonia. > Bilateral pleural effusions appear to be primary driver.  Lower suspicion for pneumonia given no leukocytosis nor fever though somewhat immunocompromised with his cirrhosis and age.  No prior history of CHF though BNP is mildly persistently elevated to 154, will check echocardiogram for completeness. - Monitor on telemetry overnight - Consult to IR for thoracentesis with labs to further evaluate etiology - Echocardiogram to eval for cardiac component - Continue with home diuretics for now given low normal blood pressure - Trend fever curve and WBC - Supportive care  Cirrhosis > Complicated by history of portal hypertension, ascites, hepatic encephalopathy. > Presented with distended abdomen and has undergone 9 L paracentesis with albumin  administration in the ED yesterday. > LFTs stable with normal AST, ALT, alk phos.  T. bili 3.1.  Albumin  1.7.  PT/INR not checked.  Sodium stable at 129 yesterday. -  Monitoring on telemetry as above - Trend CMP, PT/INR - Monitor blood pressure, albumin  as needed, may need midodrine but stable for now - Continue home Lasix   Colon cancer > Per chart, appears to been discovered in June when undergoing colonoscopy for GI bleed.  Not a surgical candidate nor systemic chemotherapy candidate given his comorbidities.  Has received radiation. > Follows with oncology and palliative care.  Remains full code and full scope of treatment.  Currently being surveilled by oncology with follow-up planned for later this month. - Continue follow-up outpatient with oncology. - CTA did mention possible ground glass lesion with recommendation for follow-up as well  Alcohol use > Continues to drink - Continue home thiamine , folate, multivitamin  COPD - Replace home Symbicort  Breo - Continue home albuterol  and Singulair   DVT prophylaxis: Lovenox  Code Status:   Full Family Communication:  None on admission  Disposition Plan:   Patient is from:  Home  Anticipated DC to:  Pending clinical course  Anticipated DC date:  1 to 3 days  Anticipated DC barriers: None  Consults called:  IR consult ordered for thoracentesis Admission status:  Observation, telemetry  Severity of Illness: The appropriate patient status for this patient is OBSERVATION. Observation status is judged to be reasonable and necessary in order to provide the required intensity of service to ensure the patient's safety. The patient's presenting symptoms, physical exam findings, and initial radiographic and laboratory data in the context of their medical condition is felt to place them at decreased risk for further clinical deterioration. Furthermore, it is anticipated that the patient will be medically stable for discharge from the hospital within 2 midnights of admission.    Marsa KATHEE Scurry MD [facility]  How to contact the [facility] Attending or Consulting provider 7A - 7P or covering provider  during after hours 7P -7A, for this patient?   Check the care team in [facility] and look for a) attending/consulting [facility] provider listed and b) the [facility] team listed Log into [email or web address] and use [place]'s universal password to access. If you do not have the password, please contact the hospital operator. Locate the [facility] provider you are looking for under [facility] and page to a number that you can be directly reached. If you still have difficulty reaching the provider, please page the [facility] (Director on Call) for the Hospitalists listed on amion for assistance.  04/19/2024, 4:02 PM

## 2024-04-19 NOTE — TOC Initial Note (Signed)
 Transition of Care [facility]) - Initial/Assessment Note    Patient Details  Name: Joel Harrell MRN: [id number] Date of Birth: 1956-06-25  [facility]) CM/SW Contact:    [place] L Devonne Kitchen, LCSW Phone Number: 04/19/2024, 5:16 PM  Clinical Narrative:                  CSW went to patients room to discuss SNF placement. Patient was asleep but easily awoke when his name was called. CSW discussed SNF placements. Patient advised that he would like CSW to speak with his son and/or his wife.   3:40pm: CSW contacted Obie Kallenbach, patients son. Mr. Erbe advised that patient is stubborn. He wants to go home so he can get back to drinking. Mr. Rylee advised that his father is an alcoholic. He stated that his fathers spouse is ten years younger but she has health issues as well. He agreed with the recommendation for his father to go to SNF but he understood that patient is able to make decisions for himself. Mr. Wix advised that he was on his way to [facility] to speak with patient. He agreed for CSW to start SNF process.        Patient Goals and CMS Choice            Expected Discharge Plan and Services                                              Prior Living Arrangements/Services                       Activities of Daily Living      Permission Sought/Granted                  Emotional Assessment              Admission diagnosis:  Pleural effusion [J90] Patient Active Problem List   Diagnosis Date Noted   Pleural effusion 04/19/2024   Palliative care encounter 12/15/2023   Colon cancer (HCC) 12/14/2023   Adenocarcinoma of sigmoid colon (HCC) 12/14/2023   Goals of care, counseling/discussion 12/14/2023   Overweight (BMI 25.0-29.9) 12/13/2023   Neoplasm of digestive system 12/13/2023   Mass of colon 12/13/2023   Hypokalemia 12/12/2023   Hyponatremia 12/12/2023   Hypomagnesemia 12/12/2023   Hypophosphatemia 12/12/2023   Portal hypertension  (HCC) 12/12/2023   Ascites due to alcoholic cirrhosis (HCC) 12/11/2023   Essential hypertension 12/11/2023   Pancytopenia (HCC) 12/11/2023   Acute blood loss anemia 12/11/2023   Iron  deficiency anemia 12/11/2023   GI bleeding 12/10/2023   Acute hepatic encephalopathy (HCC) 11/22/2022   Iron  deficiency anemia due to chronic blood loss 11/22/2022   Decompensated hepatic cirrhosis (HCC) 11/22/2022   Alcohol use disorder 11/22/2022   Hypertension 11/22/2022   COPD (chronic obstructive pulmonary disease) (HCC) 11/22/2022   PCP:  Patient, No Pcp Per Pharmacy:   [facility] - [place], [place] - [address] [place] [address] Phone: [phone] Fax: [phone]  [id number] [place], [place] - [address] AT [facility] OF [address] [place] [address] Phone: [phone] Fax: [phone]  [facility] #[id number] - [place], [place] - [address] AT [facility] OF [address] [place] [address] Phone: [phone] Fax: [phone]     Social Drivers of Health (SDOH) Social History: SDOH Screenings   Food Insecurity: No Food Insecurity (12/11/2023)  Housing: Low Risk  (12/11/2023)  Transportation Needs: No Transportation Needs (12/11/2023)  Utilities: Not At Risk (12/11/2023)  Social Connections: Moderately Integrated (12/11/2023)  Tobacco Use: Low Risk  (04/18/2024)   SDOH Interventions:     Readmission Risk Interventions    12/15/2023    8:53 AM  Readmission Risk Prevention Plan  PCP or Specialist Appt within 3-5 Days Complete  HRI or Home Care Consult Complete  Social Work Consult for Recovery Care Planning/Counseling Complete  Palliative Care Screening Not Applicable

## 2024-04-19 NOTE — TOC Progression Note (Signed)
 Transition of Care [facility]) - Progression Note    Patient Details  Name: Joel Harrell MRN: [id number] Date of Birth: 01-14-1956  Transition [facility] Care [facility]) CM/SW Contact  [place] Dear[place], [place] Phone Number: 04/19/2024, 4:56 PM  Clinical Narrative:     Clinical documents faxed to [facility] to obtain PASSR.                     Expected Discharge Plan and Services                                               Social Drivers of Health (SDOH) Interventions SDOH Screenings   Food Insecurity: No Food Insecurity (12/11/2023)  Housing: Low Risk  (12/11/2023)  Transportation Needs: No Transportation Needs (12/11/2023)  Utilities: Not At Risk (12/11/2023)  Social Connections: Moderately Integrated (12/11/2023)  Tobacco Use: Low Risk  (04/18/2024)    Readmission Risk Interventions    12/15/2023    8:53 AM  Readmission Risk Prevention Plan  PCP or Specialist Appt within 3-5 Days Complete  HRI or Home Care Consult Complete  Social Work Consult for Recovery Care Planning/Counseling Complete  Palliative Care Screening Not Applicable

## 2024-04-19 NOTE — ED Notes (Signed)
 OT at bedside.

## 2024-04-19 NOTE — ED Notes (Signed)
 This RN with the help of Donny, RN changed chucks pads and sacral dressing for pt. Bed is low/locked, call bell within reach, no other needs expressed at this time.

## 2024-04-19 NOTE — [place] FL2 (Deleted)
 [facility]  MEDICAID FL2 LEVEL OF CARE FORM     IDENTIFICATION  Patient Name: Joel Harrell Birthdate: 05-09-1956 Sex: male Admission Date (Current Location): 04/18/2024  [place] and [place] Number:  Chiropodist and Address:  [facility], [address], [place], [place] [address]      Provider Number: [id number]  Attending Physician Name and Address:  Seena Marsa NOVAK, MD  Relative Name and Phone Number:  Wassim Kirksey [phone]    Current Level of Care: Hospital Recommended Level of Care: Skilled Nursing Facility Prior Approval Number:    Date Approved/Denied:   PASRR Number:    Discharge Plan: SNF    Current Diagnoses: Patient Active Problem List   Diagnosis Date Noted   Pleural effusion 04/19/2024   Palliative care encounter 12/15/2023   Colon cancer (HCC) 12/14/2023   Adenocarcinoma of sigmoid colon (HCC) 12/14/2023   Goals of care, counseling/discussion 12/14/2023   Overweight (BMI 25.0-29.9) 12/13/2023   Neoplasm of digestive system 12/13/2023   Mass of colon 12/13/2023   Hypokalemia 12/12/2023   Hyponatremia 12/12/2023   Hypomagnesemia 12/12/2023   Hypophosphatemia 12/12/2023   Portal hypertension (HCC) 12/12/2023   Ascites due to alcoholic cirrhosis (HCC) 12/11/2023   Essential hypertension 12/11/2023   Pancytopenia (HCC) 12/11/2023   Acute blood loss anemia 12/11/2023   Iron  deficiency anemia 12/11/2023   GI bleeding 12/10/2023   Acute hepatic encephalopathy (HCC) 11/22/2022   Iron  deficiency anemia due to chronic blood loss 11/22/2022   Decompensated hepatic cirrhosis (HCC) 11/22/2022   Alcohol use disorder 11/22/2022   Hypertension 11/22/2022   COPD (chronic obstructive pulmonary disease) (HCC) 11/22/2022    Orientation RESPIRATION BLADDER Height & Weight        Normal Incontinent Weight:   Height:  6' 3 (190.5 cm)  BEHAVIORAL SYMPTOMS/MOOD NEUROLOGICAL BOWEL NUTRITION STATUS      Incontinent Diet (Diet  regular Fluid consistency: Thin)  AMBULATORY STATUS COMMUNICATION OF NEEDS Skin   Extensive Assist Verbally Other (Comment), Normal (pale, dry and intact)                       Personal Care Assistance Level of Assistance  Bathing, Feeding, Dressing Bathing Assistance: Maximum assistance Feeding assistance: Limited assistance Dressing Assistance: Maximum assistance     Functional Limitations Info  Sight, Hearing, Speech          SPECIAL CARE FACTORS FREQUENCY  PT (By licensed PT), OT (By licensed OT)     PT Frequency: 2x OT Frequency: 2x            Contractures Contractures Info: Not present    Additional Factors Info  Code Status, Allergies Code Status Info: Prior Allergies Info: NKA           Current Medications (04/19/2024):  This is the current hospital active medication list Current Facility-Administered Medications  Medication Dose Route Frequency Provider Last Rate Last Admin   acetaminophen  (TYLENOL ) tablet 650 mg  650 mg Oral Q6H PRN Seena Marsa NOVAK, MD       Or   acetaminophen  (TYLENOL ) suppository 650 mg  650 mg Rectal Q6H PRN Seena Marsa NOVAK, MD       albuterol  (PROVENTIL ) (2.5 MG/3ML) 0.083% nebulizer solution 2.5 mg  2.5 mg Inhalation Q6H PRN Goodman, Graydon, MD       aspirin  EC tablet 81 mg  81 mg Oral Daily Goodman, Graydon, MD   81 mg at 04/19/24 1003   bisacodyl  (DULCOLAX) EC tablet 10  mg  10 mg Oral Daily PRN Goodman, Graydon, MD       enoxaparin  (LOVENOX ) injection 40 mg  40 mg Subcutaneous Q24H Seena Marsa NOVAK, MD       ferrous sulfate  tablet 325 mg  325 mg Oral BID Goodman, Graydon, MD   325 mg at 04/19/24 1002   fluticasone furoate-vilanterol (BREO ELLIPTA) 200-25 MCG/ACT 1 puff  1 puff Inhalation Daily Melvin, Alexander B, MD       folic acid  (FOLVITE ) tablet 1 mg  1 mg Oral Daily Goodman, Graydon, MD   1 mg at 04/19/24 1004   furosemide  (LASIX ) tablet 40 mg  40 mg Oral Daily Goodman, Graydon, MD   40 mg at 04/19/24 1003    lisinopril  (ZESTRIL ) tablet 20 mg  20 mg Oral Daily Goodman, Graydon, MD   20 mg at 04/19/24 1001   montelukast  (SINGULAIR ) tablet 10 mg  10 mg Oral QHS Goodman, Graydon, MD       multivitamin with minerals tablet 1 tablet  1 tablet Oral Daily Floy Roberts, MD   1 tablet at 04/19/24 1004   pantoprazole  (PROTONIX ) EC tablet 40 mg  40 mg Oral BID Goodman, Graydon, MD   40 mg at 04/19/24 1002   polyethylene glycol (MIRALAX  / GLYCOLAX ) packet 17 g  17 g Oral Daily PRN Goodman, Graydon, MD       sodium chloride  flush (NS) 0.9 % injection 3 mL  3 mL Intravenous Q12H Melvin, Alexander B, MD       thiamine  (VITAMIN B1) tablet 100 mg  100 mg Oral Daily Goodman, Graydon, MD   100 mg at 04/19/24 1004   Current Outpatient Medications  Medication Sig Dispense Refill   albuterol  (VENTOLIN  HFA) 108 (90 Base) MCG/ACT inhaler Inhale 2 puffs into the lungs every 6 (six) hours as needed for wheezing or shortness of breath. 6.7 g 2   aspirin  EC 81 MG tablet Take 81 mg by mouth daily. Swallow whole.     bisacodyl  (DULCOLAX) 5 MG EC tablet Take 2 tablets (10 mg total) by mouth daily as needed for moderate constipation. 30 tablet 0   budesonide -formoterol  (SYMBICORT ) 160-4.5 MCG/ACT inhaler Inhale 2 puffs into the lungs in the morning and at bedtime. 1 each 12   ferrous sulfate  325 (65 FE) MG EC tablet Take 1 tablet (325 mg total) by mouth 2 (two) times daily. 60 tablet 3   folic acid  (FOLVITE ) 1 MG tablet Take 1 tablet (1 mg total) by mouth daily. 30 tablet 1   furosemide  (LASIX ) 40 MG tablet Take 1 tablet (40 mg total) by mouth daily. 30 tablet 0   lisinopril  (ZESTRIL ) 20 MG tablet Take 20 mg by mouth daily.     montelukast  (SINGULAIR ) 10 MG tablet Take 1 tablet (10 mg total) by mouth at bedtime. 30 tablet 2   Multiple Vitamin (MULTIVITAMIN WITH MINERALS) TABS tablet Take 1 tablet by mouth daily.     polyethylene glycol powder (GLYCOLAX /MIRALAX ) 17 GM/SCOOP powder Take 17 g by mouth daily as needed for mild  constipation. 238 g 0   pantoprazole  (PROTONIX ) 40 MG tablet Take 1 tablet (40 mg total) by mouth 2 (two) times daily. (Patient not taking: Reported on 04/19/2024) 60 tablet 0   thiamine  (VITAMIN B-1) 100 MG tablet Take 1 tablet (100 mg total) by mouth daily. (Patient not taking: Reported on 04/19/2024) 30 tablet 1     Discharge Medications: Please see discharge summary for a list of discharge medications.  Relevant Imaging  Results:  Relevant Lab Results:   Additional Information [id number]  [place] L Jayd Forrey, LCSW

## 2024-04-19 NOTE — ED Notes (Signed)
 Pt called out because he soiled the sheets and needed to be changed. This tech and pt's nurse came in the room and changed pt and his bed and placed a brief on pt. Pt also asked for a snack and this tech provided that after making RN aware. Pt has no other needs at the moment.
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
## 2024-04-19 NOTE — ED Notes (Signed)
Pt sleeping.  Respirations even and unlabored.

## 2024-04-19 NOTE — ED Notes (Signed)
 Patient transported to CT

## 2024-04-19 NOTE — Evaluation (Signed)
 Physical Therapy Evaluation Patient Details Name: Joel Harrell MRN: [id number] DOB: 05/14/1956 Today's Date: 04/19/2024  History of Present Illness  Joel Harrell is a 68 y.o. 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  And hypertension who presents complaining of increasing shortness of breath and bilateral lower extremity edema over the past month.  Also endorses abdominal distention that has been worsening and is now tense   Clinical Impression  Patient received on stretcher in ED. He is agreeable to PT assessment. Patient has significant swelling to LEs and abdomen. He requires mod A for bed mobility due to pain and heaviness of LEs and trunk. He is able to stand from stretcher with mod A. Patient ambulated 15 feet with RW and cga. He has wide BOS due to pain and swelling. He will continue to benefit from skilled PT to improve independence.         If plan is discharge home, recommend the following: A lot of help with walking and/or transfers;A lot of help with bathing/dressing/bathroom   Can travel by private vehicle   No    Equipment Recommendations None recommended by PT  Recommendations for Other Services       Functional Status Assessment Patient has had a recent decline in their functional status and demonstrates the ability to make significant improvements in function in a reasonable and predictable amount of time.     Precautions / Restrictions Precautions Precautions: Fall Recall of Precautions/Restrictions: Intact Restrictions Weight Bearing Restrictions Per Provider Order: No      Mobility  Bed Mobility Overal bed mobility: Needs Assistance Bed Mobility: Supine to Sit     Supine to sit: Mod assist     General bed mobility comments: assist to lift legs out of bed, mod A to elevate trunk to seated position    Transfers Overall transfer level: Needs  assistance Equipment used: Rolling walker (2 wheels) Transfers: Sit to/from Stand Sit to Stand: Mod assist           General transfer comment: uncontolled descent into recliner    Ambulation/Gait Ambulation/Gait assistance: Contact guard assist Gait Distance (Feet): 15 Feet Assistive device: Rolling walker (2 wheels) Gait Pattern/deviations: Step-through pattern, Decreased step length - right, Decreased step length - left, Wide base of support Gait velocity: decr     General Gait Details: patient unable to get B LEs inside walker due to swelling- needs wide walker  Stairs            Wheelchair Mobility     Tilt Bed    Modified Rankin (Stroke Patients Only)       Balance Overall balance assessment: Needs assistance Sitting-balance support: Feet supported Sitting balance-[name]: Good     Standing balance support: Bilateral upper extremity supported, During functional activity, Reliant on assistive device for balance Standing balance-[name]: Fair                               Pertinent Vitals/Pain Pain Assessment Pain Assessment: Faces Faces Pain Scale: Hurts even more Pain Location: Groin, bottom, LEs with mobility Pain Descriptors / Indicators: Tender, Sore Pain Intervention(s): Monitored during session, Repositioned    Home Living Family/patient expects to be discharged to:: Private residence Living Arrangements: Spouse/significant other Available Help at Discharge: Family;Available PRN/intermittently Type of Home: Apartment Home Access: Level entry       Home  Layout: One level Home Equipment: Grab bars - tub/shower;BSC/3in1;Rollator (4 wheels);Rolling Walker (2 wheels) Additional Comments: pt has 3in1 over toilet at home.  States he does have a 4 wheeled walker but this does not have a seat.    Prior Function Prior Level of Function : Needs assist       Physical Assist : Mobility (physical);ADLs (physical)      Mobility Comments: supv/likely STS assist on occasion ADLs Comments: Pt reports nephews live within same apartment complex and assist with IADLs like taking out the trash.  Pt reports spouse helps to don pants/shorts.  Pt admits difficulty stepping over tub.     Extremity/Trunk Assessment   Upper Extremity Assessment Upper Extremity Assessment: Defer to OT evaluation    Lower Extremity Assessment Lower Extremity Assessment: Generalized weakness;LLE deficits/detail;RLE deficits/detail RLE Deficits / Details: B LEs very swollen RLE Coordination: decreased gross motor LLE Coordination: decreased gross motor    Cervical / Trunk Assessment Cervical / Trunk Assessment: Normal  Communication   Communication Communication: No apparent difficulties    Cognition Arousal: Alert Behavior During Therapy: WFL for tasks assessed/performed   PT - Cognitive impairments: No apparent impairments                         Following commands: Intact       Cueing Cueing Techniques: Verbal cues     General Comments General comments (skin integrity, edema, etc.): LEs heavy, very edemadous, painful; pt does report feeling better after paracentesis last night    Exercises     Assessment/Plan    PT Assessment Patient needs continued PT services  PT Problem List Decreased strength;Decreased activity tolerance;Decreased balance;Decreased mobility;Pain;Decreased skin integrity       PT Treatment Interventions DME instruction;Gait training;Functional mobility training;Therapeutic activities;Therapeutic exercise;Patient/family education    PT Goals (Current goals can be found in the Care Plan section)  Acute Rehab PT Goals Patient Stated Goal: to improve swelling PT Goal Formulation: With patient Time For Goal Achievement: 05/03/24 Potential to Achieve Goals: Fair    Frequency Min 2X/week     Co-evaluation               AM-PAC PT 6 Clicks Mobility  Outcome Measure  Help needed turning from your back to your side while in a flat bed without using bedrails?: A Lot Help needed moving from lying on your back to sitting on the side of a flat bed without using bedrails?: A Lot Help needed moving to and from a bed to a chair (including a wheelchair)?: A Little Help needed standing up from a chair using your arms (e.g., wheelchair or bedside chair)?: A Lot Help needed to walk in hospital room?: A Little Help needed climbing 3-5 steps with a railing? : A Lot 6 Click Score: 14    End of Session   Activity Tolerance: Patient limited by fatigue;Patient limited by pain Patient left: in chair;with call bell/phone within reach Nurse Communication: Mobility status;Other (comment) (patient requesting more food.) PT Visit Diagnosis: Other abnormalities of gait and mobility (R26.89);Muscle weakness (generalized) (M62.81);Pain;Difficulty in walking, not elsewhere classified (R26.2) Pain - Right/Left:  (B LEs) Pain - part of body: Leg    Time: 1025-1047 PT Time Calculation (min) (ACUTE ONLY): 22 min   Charges:   PT Evaluation $PT Eval Low Complexity: 1 Low PT Treatments $Gait Training: 8-22 mins PT General Charges $$ ACUTE PT VISIT: 1 Visit  Jacalyn Biggs, PT, GCS 04/19/24,11:41 AM

## 2024-04-19 NOTE — ED Notes (Signed)
 Pt eating breakfast tray at bedside

## 2024-04-19 NOTE — ED Provider Notes (Addendum)
 Care assumed of patient from outgoing provider.  See their note for initial history, exam and plan.  Patient has a past medical history significant for alcohol cirrhosis, alcohol use disorder, recurrent need of paracentesis who presents to to the emergency department with shortness of breath and abdominal pain.  Patient had a large-volume paracentesis done yesterday.  Family was concerned and wanting some home health help so he has been boarding in the emergency department for further evaluation by PT and OT.  Social work is following.  On reevaluation this morning patient continues to be tachycardic but normotensive, soft blood pressures.  100% on room air.  Does have increased work of breathing.  On my exam has significant edema to his lower extremity but is worse in the left lower extremity.  On chart review patient had large-volume paracentesis done yesterday.  Albumin  was given.  Patient continues to look volume overloaded despite large-volume paracentesis.  Given his persistent tachycardia we will obtain a CTA to further evaluate for pulmonary embolism.  On my evaluation no obvious findings of pulmonary embolism.  Did note complex pleural effusion.  Given ongoing tachycardia, pleural effusion and findings of volume overload will consult hospitalist for admission.   CTA with no signs of pulmonary embolism.  Large pleural effusions.  Discussed possible antibiotics with the hospitalist service however no white count or fever they will hold off and discussed with IR.  Recommended holding antibiotics at this time.  Have a very low suspicion for an infectious process, but does appear volume overloaded.  Patient admitted for pleural effusions and volume overload     Suzanne Kirsch, MD 04/19/24 1525    Suzanne Kirsch, MD 04/19/24 1556    Suzanne Kirsch, MD 04/19/24 1556

## 2024-04-19 NOTE — NC FL2 (Signed)
 [place]  MEDICAID FL2 LEVEL OF CARE FORM     IDENTIFICATION  Patient Name: Joel Harrell Birthdate: 14-Jun-1956 Sex: male Admission Date (Current Location): 04/18/2024  [place] and [place] Number:  Chiropodist and Address:  [facility], [address], [place], [place] [address]      Provider Number: [id number]  Attending Physician Name and Address:  Seena Marsa NOVAK, MD  Relative Name and Phone Number:  Yolanda Dockendorf [phone]    Current Level of Care: Hospital Recommended Level of Care: Skilled Nursing Facility Prior Approval Number:    Date Approved/Denied:   PASRR Number:    Discharge Plan: SNF    Current Diagnoses: Patient Active Problem List   Diagnosis Date Noted   Pleural effusion 04/19/2024   Palliative care encounter 12/15/2023   Colon cancer (HCC) 12/14/2023   Adenocarcinoma of sigmoid colon (HCC) 12/14/2023   Goals of care, counseling/discussion 12/14/2023   Overweight (BMI 25.0-29.9) 12/13/2023   Neoplasm of digestive system 12/13/2023   Mass of colon 12/13/2023   Hypokalemia 12/12/2023   Hyponatremia 12/12/2023   Hypomagnesemia 12/12/2023   Hypophosphatemia 12/12/2023   Portal hypertension (HCC) 12/12/2023   Ascites due to alcoholic cirrhosis (HCC) 12/11/2023   Essential hypertension 12/11/2023   Pancytopenia (HCC) 12/11/2023   Acute blood loss anemia 12/11/2023   Iron  deficiency anemia 12/11/2023   GI bleeding 12/10/2023   Acute hepatic encephalopathy (HCC) 11/22/2022   Iron  deficiency anemia due to chronic blood loss 11/22/2022   Decompensated hepatic cirrhosis (HCC) 11/22/2022   Alcohol use disorder 11/22/2022   Hypertension 11/22/2022   COPD (chronic obstructive pulmonary disease) (HCC) 11/22/2022    Orientation RESPIRATION BLADDER Height & Weight     Self, Time, Situation, Place  Normal Continent Weight: 224 lb 12.8 oz (102 kg) Height:  6' 3 (190.5 cm)  BEHAVIORAL SYMPTOMS/MOOD NEUROLOGICAL BOWEL  NUTRITION STATUS      Continent Diet (Diet regular Fluid consistency: Thin)  AMBULATORY STATUS COMMUNICATION OF NEEDS Skin   Extensive Assist Verbally Other (Comment), Normal (pale, dry and intact)                       Personal Care Assistance Level of Assistance  Bathing, Feeding, Dressing Bathing Assistance: Maximum assistance Feeding assistance: Limited assistance Dressing Assistance: Maximum assistance     Functional Limitations Info  Sight, Hearing, Speech          SPECIAL CARE FACTORS FREQUENCY  PT (By licensed PT), OT (By licensed OT)     PT Frequency: 2x OT Frequency: 2x            Contractures Contractures Info: Not present    Additional Factors Info  Code Status, Allergies Code Status Info: Prior Allergies Info: NKA           Current Medications (04/19/2024):  This is the current hospital active medication list Current Facility-Administered Medications  Medication Dose Route Frequency Provider Last Rate Last Admin   acetaminophen  (TYLENOL ) tablet 650 mg  650 mg Oral Q6H PRN Seena Marsa NOVAK, MD       Or   acetaminophen  (TYLENOL ) suppository 650 mg  650 mg Rectal Q6H PRN Seena Marsa NOVAK, MD       albuterol  (PROVENTIL ) (2.5 MG/3ML) 0.083% nebulizer solution 2.5 mg  2.5 mg Inhalation Q6H PRN Goodman, Graydon, MD       aspirin  EC tablet 81 mg  81 mg Oral Daily Goodman, Graydon, MD   81 mg at 04/19/24 1003  bisacodyl  (DULCOLAX) EC tablet 10 mg  10 mg Oral Daily PRN Goodman, Graydon, MD       enoxaparin  (LOVENOX ) injection 40 mg  40 mg Subcutaneous Q24H Seena Marsa NOVAK, MD       ferrous sulfate  tablet 325 mg  325 mg Oral BID Goodman, Graydon, MD   325 mg at 04/19/24 1002   fluticasone furoate-vilanterol (BREO ELLIPTA) 200-25 MCG/ACT 1 puff  1 puff Inhalation Daily Melvin, Alexander B, MD       folic acid  (FOLVITE ) tablet 1 mg  1 mg Oral Daily Goodman, Graydon, MD   1 mg at 04/19/24 1004   furosemide  (LASIX ) tablet 40 mg  40 mg Oral Daily  Goodman, Graydon, MD   40 mg at 04/19/24 1003   lisinopril  (ZESTRIL ) tablet 20 mg  20 mg Oral Daily Goodman, Graydon, MD   20 mg at 04/19/24 1001   montelukast  (SINGULAIR ) tablet 10 mg  10 mg Oral QHS Goodman, Graydon, MD       multivitamin with minerals tablet 1 tablet  1 tablet Oral Daily Floy Roberts, MD   1 tablet at 04/19/24 1004   pantoprazole  (PROTONIX ) EC tablet 40 mg  40 mg Oral BID Goodman, Graydon, MD   40 mg at 04/19/24 1002   polyethylene glycol (MIRALAX  / GLYCOLAX ) packet 17 g  17 g Oral Daily PRN Goodman, Graydon, MD       sodium chloride  flush (NS) 0.9 % injection 3 mL  3 mL Intravenous Q12H Seena Marsa NOVAK, MD       thiamine  (VITAMIN B1) tablet 100 mg  100 mg Oral Daily Goodman, Graydon, MD   100 mg at 04/19/24 1004   Current Outpatient Medications  Medication Sig Dispense Refill   albuterol  (VENTOLIN  HFA) 108 (90 Base) MCG/ACT inhaler Inhale 2 puffs into the lungs every 6 (six) hours as needed for wheezing or shortness of breath. 6.7 g 2   aspirin  EC 81 MG tablet Take 81 mg by mouth daily. Swallow whole.     bisacodyl  (DULCOLAX) 5 MG EC tablet Take 2 tablets (10 mg total) by mouth daily as needed for moderate constipation. 30 tablet 0   budesonide -formoterol  (SYMBICORT ) 160-4.5 MCG/ACT inhaler Inhale 2 puffs into the lungs in the morning and at bedtime. 1 each 12   ferrous sulfate  325 (65 FE) MG EC tablet Take 1 tablet (325 mg total) by mouth 2 (two) times daily. 60 tablet 3   folic acid  (FOLVITE ) 1 MG tablet Take 1 tablet (1 mg total) by mouth daily. 30 tablet 1   furosemide  (LASIX ) 40 MG tablet Take 1 tablet (40 mg total) by mouth daily. 30 tablet 0   lisinopril  (ZESTRIL ) 20 MG tablet Take 20 mg by mouth daily.     montelukast  (SINGULAIR ) 10 MG tablet Take 1 tablet (10 mg total) by mouth at bedtime. 30 tablet 2   Multiple Vitamin (MULTIVITAMIN WITH MINERALS) TABS tablet Take 1 tablet by mouth daily.     polyethylene glycol powder (GLYCOLAX /MIRALAX ) 17 GM/SCOOP powder  Take 17 g by mouth daily as needed for mild constipation. 238 g 0   pantoprazole  (PROTONIX ) 40 MG tablet Take 1 tablet (40 mg total) by mouth 2 (two) times daily. (Patient not taking: Reported on 04/19/2024) 60 tablet 0   thiamine  (VITAMIN B-1) 100 MG tablet Take 1 tablet (100 mg total) by mouth daily. (Patient not taking: Reported on 04/19/2024) 30 tablet 1     Discharge Medications: Please see discharge summary for a list of  discharge medications.  Relevant Imaging Results:  Relevant Lab Results:   Additional Information [id number]  [place] L Milik Gilreath, LCSW

## 2024-04-20 ENCOUNTER — Other Ambulatory Visit: Payer: Self-pay

## 2024-04-20 ENCOUNTER — Observation Stay
Admit: 2024-04-20 | Discharge: 2024-04-20 | Disposition: A | Discharge status: Home / Self Care | Attending: Internal Medicine | Admitting: Internal Medicine

## 2024-04-20 ENCOUNTER — Observation Stay

## 2024-04-20 ENCOUNTER — Inpatient Hospital Stay

## 2024-04-20 DIAGNOSIS — F1090 Alcohol use, unspecified, uncomplicated: Secondary | ICD-10-CM | POA: Diagnosis not present

## 2024-04-20 DIAGNOSIS — Z7189 Other specified counseling: Secondary | ICD-10-CM | POA: Diagnosis not present

## 2024-04-20 DIAGNOSIS — F1010 Alcohol abuse, uncomplicated: Secondary | ICD-10-CM | POA: Diagnosis present

## 2024-04-20 DIAGNOSIS — L89152 Pressure ulcer of sacral region, stage 2: Secondary | ICD-10-CM | POA: Diagnosis present

## 2024-04-20 DIAGNOSIS — I509 Heart failure, unspecified: Secondary | ICD-10-CM | POA: Diagnosis present

## 2024-04-20 DIAGNOSIS — J4489 Other specified chronic obstructive pulmonary disease: Secondary | ICD-10-CM | POA: Diagnosis present

## 2024-04-20 DIAGNOSIS — Z8249 Family history of ischemic heart disease and other diseases of the circulatory system: Secondary | ICD-10-CM | POA: Diagnosis not present

## 2024-04-20 DIAGNOSIS — Z91148 Patient's other noncompliance with medication regimen for other reason: Secondary | ICD-10-CM | POA: Diagnosis not present

## 2024-04-20 DIAGNOSIS — R5381 Other malaise: Secondary | ICD-10-CM | POA: Diagnosis present

## 2024-04-20 DIAGNOSIS — C189 Malignant neoplasm of colon, unspecified: Secondary | ICD-10-CM | POA: Diagnosis present

## 2024-04-20 DIAGNOSIS — R000 Tachycardia, unspecified: Secondary | ICD-10-CM | POA: Diagnosis not present

## 2024-04-20 DIAGNOSIS — R600 Localized edema: Secondary | ICD-10-CM | POA: Diagnosis not present

## 2024-04-20 DIAGNOSIS — L89891 Pressure ulcer of other site, stage 1: Secondary | ICD-10-CM | POA: Diagnosis present

## 2024-04-20 DIAGNOSIS — E876 Hypokalemia: Secondary | ICD-10-CM | POA: Diagnosis present

## 2024-04-20 DIAGNOSIS — I110 Hypertensive heart disease with heart failure: Secondary | ICD-10-CM | POA: Diagnosis present

## 2024-04-20 DIAGNOSIS — Z7951 Long term (current) use of inhaled steroids: Secondary | ICD-10-CM | POA: Diagnosis not present

## 2024-04-20 DIAGNOSIS — Z923 Personal history of irradiation: Secondary | ICD-10-CM | POA: Diagnosis not present

## 2024-04-20 DIAGNOSIS — D509 Iron deficiency anemia, unspecified: Secondary | ICD-10-CM | POA: Diagnosis present

## 2024-04-20 DIAGNOSIS — Z515 Encounter for palliative care: Secondary | ICD-10-CM | POA: Diagnosis not present

## 2024-04-20 DIAGNOSIS — R64 Cachexia: Secondary | ICD-10-CM | POA: Diagnosis present

## 2024-04-20 DIAGNOSIS — K766 Portal hypertension: Secondary | ICD-10-CM | POA: Diagnosis present

## 2024-04-20 DIAGNOSIS — K7290 Hepatic failure, unspecified without coma: Secondary | ICD-10-CM | POA: Diagnosis not present

## 2024-04-20 DIAGNOSIS — Z66 Do not resuscitate: Secondary | ICD-10-CM | POA: Diagnosis not present

## 2024-04-20 DIAGNOSIS — Z79899 Other long term (current) drug therapy: Secondary | ICD-10-CM | POA: Diagnosis not present

## 2024-04-20 DIAGNOSIS — Z5982 Transportation insecurity: Secondary | ICD-10-CM | POA: Diagnosis not present

## 2024-04-20 DIAGNOSIS — Z7982 Long term (current) use of aspirin: Secondary | ICD-10-CM | POA: Diagnosis not present

## 2024-04-20 DIAGNOSIS — K7031 Alcoholic cirrhosis of liver with ascites: Secondary | ICD-10-CM | POA: Diagnosis present

## 2024-04-20 DIAGNOSIS — R0602 Shortness of breath: Secondary | ICD-10-CM | POA: Diagnosis present

## 2024-04-20 DIAGNOSIS — I8510 Secondary esophageal varices without bleeding: Secondary | ICD-10-CM | POA: Diagnosis present

## 2024-04-20 DIAGNOSIS — I8500 Esophageal varices without bleeding: Secondary | ICD-10-CM | POA: Insufficient documentation

## 2024-04-20 DIAGNOSIS — J90 Pleural effusion, not elsewhere classified: Secondary | ICD-10-CM | POA: Diagnosis not present

## 2024-04-20 LAB — COMPREHENSIVE METABOLIC PANEL WITH GFR
ALT: 13 U/L (ref 0–44)
AST: 30 U/L (ref 15–41)
Albumin: <1.5 g/dL — ABNORMAL LOW (ref 3.5–5.0)
Alkaline Phosphatase: 41 U/L (ref 38–126)
Anion gap: 4 — ABNORMAL LOW (ref 5–15)
BUN: 9 mg/dL (ref 8–23)
CO2: 26 mmol/L (ref 22–32)
Calcium: 7 mg/dL — ABNORMAL LOW (ref 8.9–10.3)
Chloride: 99 mmol/L (ref 98–111)
Creatinine, Ser: 0.67 mg/dL (ref 0.61–1.24)
GFR, Estimated: >60 mL/min (ref >60)
Glucose, Bld: 107 mg/dL — ABNORMAL HIGH (ref 70–99)
Potassium: 3.3 mmol/L — ABNORMAL LOW (ref 3.5–5.1)
Sodium: 129 mmol/L — ABNORMAL LOW (ref 135–145)
Total Bilirubin: 2.2 mg/dL — ABNORMAL HIGH (ref 0.0–1.2)
Total Protein: 6.3 g/dL — ABNORMAL LOW (ref 6.5–8.1)

## 2024-04-20 LAB — ECHOCARDIOGRAM COMPLETE
AR max vel: 2.62 cm2
AV Area VTI: 3.05 cm2
AV Area mean vel: 2.54 cm2
AV Mean grad: 4 mmHg
AV Peak grad: 6.9 mmHg
Ao pk vel: 1.31 m/s
Area-P 1/2: 2.75 cm2
Height: 75 in
S' Lateral: 3.4 cm
Weight: 3596.8 [oz_av]

## 2024-04-20 LAB — BODY FLUID CELL COUNT WITH DIFFERENTIAL
Eos, Fluid: 0 %
Lymphs, Fluid: 66 %
Monocyte-Macrophage-Serous Fluid: 15 %
Neutrophil Count, Fluid: 19 %
Total Nucleated Cell Count, Fluid: 132 uL

## 2024-04-20 LAB — CBC
HCT: 25.3 % — ABNORMAL LOW (ref 39.0–52.0)
Hemoglobin: 8.2 g/dL — ABNORMAL LOW (ref 13.0–17.0)
MCH: 31.7 pg (ref 26.0–34.0)
MCHC: 32.4 g/dL (ref 30.0–36.0)
MCV: 97.7 fL (ref 80.0–100.0)
Platelets: 163 K/uL (ref 150–400)
RBC: 2.59 MIL/uL — ABNORMAL LOW (ref 4.22–5.81)
RDW: 17.5 % — ABNORMAL HIGH (ref 11.5–15.5)
WBC: 4.4 K/uL (ref 4.0–10.5)
nRBC: 0 % (ref 0.0–0.2)

## 2024-04-20 LAB — PROTEIN, PLEURAL OR PERITONEAL FLUID: Total protein, fluid: 3.9 g/dL

## 2024-04-20 LAB — LACTATE DEHYDROGENASE, PLEURAL OR PERITONEAL FLUID: LD, Fluid: 105 U/L — ABNORMAL HIGH (ref 3–23)

## 2024-04-20 LAB — ALBUMIN, PLEURAL OR PERITONEAL FLUID: Albumin, Fluid: <1.5 g/dL

## 2024-04-20 LAB — GLUCOSE, PLEURAL OR PERITONEAL FLUID: Glucose, Fluid: 120 mg/dL

## 2024-04-20 LAB — PROTIME-INR
INR: 1.6 — ABNORMAL HIGH (ref 0.8–1.2)
Prothrombin Time: 20.1 s — ABNORMAL HIGH (ref 11.4–15.2)

## 2024-04-20 LAB — TROPONIN I (HIGH SENSITIVITY): Troponin I (High Sensitivity): 4 ng/L (ref <18)

## 2024-04-20 MED ORDER — ALUM & MAG HYDROXIDE-SIMETH 200-200-20 MG/5ML PO SUSP
15.0000 mL | Freq: Four times a day (QID) | ORAL | Status: DC | PRN
  Administered 2024-04-20 – 2024-04-22 (×3): 15 mL via ORAL
  Filled 2024-04-20 (×3): qty 30

## 2024-04-20 MED ORDER — SPIRONOLACTONE 25 MG PO TABS
100.0000 mg | ORAL_TABLET | Freq: Every day | ORAL | Status: DC
  Administered 2024-04-21 – 2024-04-22 (×2): 100 mg via ORAL
  Filled 2024-04-20 (×2): qty 4

## 2024-04-20 MED ORDER — LIDOCAINE HCL (PF) 1 % IJ SOLN
10.0000 mL | Freq: Once | INTRAMUSCULAR | Status: AC
Start: 2024-04-20 — End: 2024-04-20
  Administered 2024-04-20: 10 mL via INTRADERMAL
  Filled 2024-04-20: qty 10

## 2024-04-20 MED ORDER — THIAMINE MONONITRATE 100 MG PO TABS
100.0000 mg | ORAL_TABLET | Freq: Every day | ORAL | Status: DC
  Administered 2024-04-21 – 2024-04-22 (×2): 100 mg via ORAL
  Filled 2024-04-20 (×2): qty 1

## 2024-04-20 MED ORDER — LORAZEPAM 2 MG PO TABS: 0.0000 mg | ORAL_TABLET | Freq: Two times a day (BID) | ORAL | Status: DC

## 2024-04-20 MED ORDER — FOLIC ACID 1 MG PO TABS
1.0000 mg | ORAL_TABLET | Freq: Every day | ORAL | Status: DC
  Administered 2024-04-21 – 2024-04-22 (×2): 1 mg via ORAL
  Filled 2024-04-20 (×2): qty 1

## 2024-04-20 MED ORDER — ADULT MULTIVITAMIN W/MINERALS CH
1.0000 | ORAL_TABLET | Freq: Every day | ORAL | Status: DC
  Administered 2024-04-21 – 2024-04-22 (×2): 1 via ORAL
  Filled 2024-04-20 (×2): qty 1

## 2024-04-20 MED ORDER — LORAZEPAM 2 MG PO TABS
0.0000 mg | ORAL_TABLET | Freq: Four times a day (QID) | ORAL | Status: DC
  Administered 2024-04-20 – 2024-04-21 (×3): 2 mg via ORAL
  Administered 2024-04-21: 1 mg via ORAL
  Filled 2024-04-20 (×3): qty 1

## 2024-04-20 MED ORDER — LORAZEPAM 2 MG/ML IJ SOLN: 1.0000 mg | INTRAMUSCULAR | Status: DC | PRN

## 2024-04-20 MED ORDER — THIAMINE HCL 100 MG/ML IJ SOLN
100.0000 mg | Freq: Every day | INTRAMUSCULAR | Status: DC
  Filled 2024-04-20: qty 2

## 2024-04-20 MED ORDER — LORAZEPAM 1 MG PO TABS
1.0000 mg | ORAL_TABLET | ORAL | Status: DC | PRN
  Filled 2024-04-20: qty 2

## 2024-04-20 NOTE — ED Notes (Signed)
Patient set up with breakfast tray 

## 2024-04-20 NOTE — Consult Note (Addendum)
 Consultation Note Date: 04/20/2024   Patient Name: Joel Harrell  DOB: March 06, 1956  MRN: [id number]  Age / Sex: 68 y.o., male  PCP: Patient, No Pcp Per Referring Physician: Kandis Devaughn Sayres, MD  Reason for Consultation: Establishing goals of care  HPI/Patient Profile: Joel Harrell is a 68 y.o. male with medical history significant of hypertension, anemia, cirrhosis, portal hypertension, ascites, hepatic cephalopathy, colon cancer, COPD, alcohol use presenting with worsening shortness of breath and edema.  Patient underwent paracentesis with a total of around 9 L of fluid removed.   Clinical Assessment and Goals of Care: Notes, diagnostics, and labs reviewed from previous admission.  Notes, diagnostics, and labs reviewed from current admission as well.  In to see patient who is being prepared to leave ED room.  Spoke with patient's son who is listed as next of kin emergency contact. Patient has an additional contact listed named Rosaline who is listed as a friend.  Son states patient lives with Rosaline, and that they refer to each other as husband and wife.  He states they have been together for a very long time, but he does not believe they were ever legally married.  He states he does not believe his father has ever been legally married.  He states the patient has 4 biological children and Rosaline has children from a previous marriage.  He states  I've known my father all my life, and kept some sort of a relationship with him since childhood.  He states he moved to the [place] area to be closer to him.    He states the patient has a son who lives in [place]  who he has been estranged from for over 20 years.  He states patient has another son who lives in [place]  who patient met for the first time around a year ago, that patient has seen one time and talks to on the phone on occasion.  He states patient has a  daughter that he has never met.  He accurately discusses updates that have been provided, and information provided in previous hospitalizations.  He states his father does not want to go to doctor's appointments, and does not want to take scribed medications or.  He states his father is a very stubborn and prideful man.  He states his father is unable to dress himself and get in and out of the shower alone, but does not want assistance.  He states his father sits on chucks in his chair and urinates and has bowel movements on them.  He states he will not allow anyone to come into the home and clean.  Son states Rosaline has health problems of her own and is unable to assist him by herself, or clean.  He states his father's only interest is drinking alcohol, and has people he knows bring it to him.  He states his father's alcohol use is much more than what he tells others.  He states his father will tell people in healthcare what they want to hear because  he used to be a Engineer, civil (consulting) himself.  He states he has considered filing an APS report.  He states he wishes his father would move into a nursing facility where he could be cared for.  Son discusses that his father did not want to come to the hospital.  He states he only came because of his shortness of breath.  He again states his father would be happy if he could just sit at home in his chair and drink alcohol.  He states now that his father has had paracentesis, he will be wanting to go with home as soon as possible so that he will be able to drink alcohol.  Discussed talking with patient about goals of care.  In to see patient, and currently he is resting in bed with staff members working with him, and a visitor at bedside.  Additional staff up to bedside to take patient to a procedure.  PMT will reattempt in-depth conversation with patient tomorrow.      SUMMARY OF RECOMMENDATIONS   PMT to follow        Primary Diagnoses: Present on Admission:   COPD (chronic obstructive pulmonary disease) (HCC)  Ascites due to alcoholic cirrhosis (HCC)  Decompensated hepatic cirrhosis (HCC)  Colon cancer (HCC)  Alcohol use disorder  Hypertension  Portal hypertension (HCC)  Iron  deficiency anemia  Pleural effusion   I have reviewed the medical record, interviewed the patient and family, and examined the patient. The following aspects are pertinent.  Past Medical History:  Diagnosis Date   Asthma    CHF (congestive heart failure) (HCC)    Hypertension    Social History   Socioeconomic History   Marital status: Single    Spouse name: Not on file   Number of children: Not on file   Years of education: Not on file   Highest education level: Not on file  Occupational History   Not on file  Tobacco Use   Smoking status: Never   Smokeless tobacco: Never  Vaping Use   Vaping status: Never Used  Substance and Sexual Activity   Alcohol use: Yes    Alcohol/week: 10.0 standard drinks of alcohol    Types: 10 Cans of beer per week   Drug use: No   Sexual activity: Not on file  Other Topics Concern   Not on file  Social History Narrative   Not on file   Social Drivers of Health   Financial Resource Strain: Not on file  Food Insecurity: No Food Insecurity (04/20/2024)   Hunger Vital Sign    Worried About Running Out of Food in the Last Year: Never true    Ran Out of Food in the Last Year: Never true  Transportation Needs: Unmet Transportation Needs (04/20/2024)   PRAPARE - Administrator, Civil Service (Medical): Yes    Lack of Transportation (Non-Medical): No  Physical Activity: Not on file  Stress: Not on file  Social Connections: Moderately Isolated (04/20/2024)   Social Connection and Isolation Panel    Frequency of Communication with Friends and Family: More than three times a week    Frequency of Social Gatherings with Friends and Family: More than three times a week    Attends Religious Services: Never    Automotive engineer or Organizations: No    Attends Banker Meetings: Never    Marital Status: Married   Family History  Problem Relation Age of Onset   Heart attack Father  Cancer Mother    Scheduled Meds:  aspirin  EC  81 mg Oral Daily   enoxaparin  (LOVENOX ) injection  40 mg Subcutaneous QHS   ferrous sulfate   325 mg Oral BID   fluticasone furoate-vilanterol  1 puff Inhalation Daily   folic acid   1 mg Oral Daily   furosemide   40 mg Oral Daily   LORazepam   0-4 mg Oral Q6H   Followed by   NOREEN ON 04/22/2024] LORazepam   0-4 mg Oral Q12H   montelukast   10 mg Oral QHS   multivitamin with minerals  1 tablet Oral Daily   pantoprazole   40 mg Oral BID   sodium chloride  flush  3 mL Intravenous Q12H   spironolactone  100 mg Oral Daily   thiamine   100 mg Oral Daily   Or   thiamine   100 mg Intravenous Daily   Continuous Infusions: PRN Meds:.acetaminophen  **OR** acetaminophen , albuterol , bisacodyl , LORazepam  **OR** LORazepam , polyethylene glycol Medications Prior to Admission:  Prior to Admission medications   Medication Sig Start Date End Date Taking? Authorizing Provider  albuterol  (VENTOLIN  HFA) 108 (90 Base) MCG/ACT inhaler Inhale 2 puffs into the lungs every 6 (six) hours as needed for wheezing or shortness of breath. 12/07/19  Yes Gordan Huxley, MD  aspirin  EC 81 MG tablet Take 81 mg by mouth daily. Swallow whole.   Yes [provider]  bisacodyl  (DULCOLAX) 5 MG EC tablet Take 2 tablets (10 mg total) by mouth daily as needed for moderate constipation. 12/22/23  Yes Alexander, Natalie, DO  budesonide -formoterol  (SYMBICORT ) 160-4.5 MCG/ACT inhaler Inhale 2 puffs into the lungs in the morning and at bedtime. 12/31/23  Yes Melanee Annah BROCKS, MD  ferrous sulfate  325 (65 FE) MG EC tablet Take 1 tablet (325 mg total) by mouth 2 (two) times daily. 11/23/22 04/19/24 Yes Leotis Bogus, MD  folic acid  (FOLVITE ) 1 MG tablet Take 1 tablet (1 mg total) by mouth daily. 11/24/22  Yes  Leotis Bogus, MD  furosemide  (LASIX ) 40 MG tablet Take 1 tablet (40 mg total) by mouth daily. 12/22/23  Yes Alexander, Natalie, DO  lisinopril  (ZESTRIL ) 20 MG tablet Take 20 mg by mouth daily.   Yes [provider]  montelukast  (SINGULAIR ) 10 MG tablet Take 1 tablet (10 mg total) by mouth at bedtime. 12/28/15 04/19/24 Yes Brain Redell RAMAN, MD  Multiple Vitamin (MULTIVITAMIN WITH MINERALS) TABS tablet Take 1 tablet by mouth daily.   Yes [provider]  polyethylene glycol powder (GLYCOLAX /MIRALAX ) 17 GM/SCOOP powder Take 17 g by mouth daily as needed for mild constipation. 12/22/23  Yes Alexander, Natalie, DO  pantoprazole  (PROTONIX ) 40 MG tablet Take 1 tablet (40 mg total) by mouth 2 (two) times daily. Patient not taking: Reported on 04/19/2024 12/22/23   Alexander, Natalie, DO  thiamine  (VITAMIN B-1) 100 MG tablet Take 1 tablet (100 mg total) by mouth daily. Patient not taking: Reported on 04/19/2024 11/24/22   Leotis Bogus, MD   No Known Allergies Review of Systems  Unable to perform ROS   Physical Exam Pulmonary:     Effort: Pulmonary effort is normal.  Musculoskeletal:     Comments: Edema to bilateral lower legs  Neurological:     Mental Status: He is alert.     Vital Signs: BP (!) 136/90   Pulse (!) 115   Temp 97.9 F (36.6 C) (Oral)   Resp 13   Ht 6' 3 (1.905 m)   Wt 102 kg   SpO2 100%   BMI 28.10 kg/m  Pain  Scale: 0-10   Pain Score: 9    SpO2: SpO2: 100 % O2 Device:SpO2: 100 % O2 Flow Rate: .   IO: Intake/output summary: No intake or output data in the 24 hours ending 04/20/24 1459  LBM:   Baseline Weight: Weight: 102 kg Most recent weight: Weight: 102 kg      Signed by: Camelia Lewis, NP   Please contact Palliative Medicine Team phone at [phone] for questions and concerns.  For individual provider: See Tracey

## 2024-04-20 NOTE — Procedures (Signed)
 PROCEDURE SUMMARY:  Successful image-guided left-sided diagnostic and therapeutic thoracentesis. Yielded 1.10 liters of clear, blood-laden pleural fluid. Patient tolerated procedure well. EBL: Zero No immediate complications.  Specimen was sent for labs. Post procedure CXR is pending.  Please see imaging section of Epic for full dictation.  Carlin LABOR Tulip Meharg PA-C 04/20/2024 4:36 PM

## 2024-04-20 NOTE — ED Notes (Signed)
 This RN and Lexi RN cleaned patient up from a loose BM at this time. Sacral dressing changed and barrier cream applied to buttocks. This RN noticed that on the posterior left thigh, there is redness, but also a harder round area of swelling there that wasn't previously noted. Will notify provider. New warm blankets placed on patient, bed low and locked and call bell in reach.

## 2024-04-20 NOTE — Progress Notes (Signed)
*  PRELIMINARY RESULTS* Echocardiogram 2D Echocardiogram has been performed.  Joel Harrell 04/20/2024, 9:51 AM

## 2024-04-20 NOTE — TOC Progression Note (Signed)
 Transition of Care [facility]) - Progression Note    Patient Details  Name: Joel Harrell MRN: [id number] Date of Birth: 05-30-1956  [facility] CM/SW Contact  Lauraine JAYSON Carpen, LCSW Phone Number: 04/20/2024, 12:35 PM  Clinical Narrative:   PASARR obtained: [id number].  Expected Discharge Plan and Services                                               Social Drivers of Health (SDOH) Interventions SDOH Screenings   Food Insecurity: No Food Insecurity (12/11/2023)  Housing: Low Risk  (12/11/2023)  Transportation Needs: No Transportation Needs (12/11/2023)  Utilities: Not At Risk (12/11/2023)  Social Connections: Moderately Integrated (12/11/2023)  Tobacco Use: Low Risk  (04/18/2024)    Readmission Risk Interventions    12/15/2023    8:53 AM  Readmission Risk Prevention Plan  PCP or Specialist Appt within 3-5 Days Complete  HRI or Home Care Consult Complete  Social Work Consult for Recovery Care Planning/Counseling Complete  Palliative Care Screening Not Applicable

## 2024-04-20 NOTE — Care Management Obs Status (Signed)
 MEDICARE OBSERVATION STATUS NOTIFICATION   Patient Details  Name: Joel Harrell MRN: [id number] Date of Birth: 23-Jun-1956   Medicare Observation Status Notification Given:  Yes    [name] 04/20/2024, 11:51 AM

## 2024-04-20 NOTE — Progress Notes (Signed)
 PROGRESS NOTE    Joel Harrell  FMW:[id number] DOB: July 11, 1956 DOA: 04/18/2024 PCP: Patient, No Pcp Per  Outpatient Specialists: oncology    Brief Narrative:   From admission h and p  Joel Harrell is a 68 y.o. male with medical history significant of hypertension, anemia, cirrhosis, portal hypertension, ascites, hepatic cephalopathy, colon cancer, COPD, alcohol use presenting with worsening shortness of breath and edema.   Patient initially presented 10/7 with 1 month of worsening lower extremity edema and shortness of breath.  Also worsening abdominal distention.  Taking medications as prescribed.   Found to have significant ascites and underwent paracentesis with 9 L removed and albumin  given.  Family reports patient refusing home health and continuing to drink with decreased ability for family to help care for him at home.   PT/OT consulted in the ED.  Patient noted to have persistent shortness of breath, tachycardia, edema in the ED.  Some component of this is chronic but some worse per patient.   Denies fevers, chills, chest pain, abdominal pain, constipation, diarrhea, nausea, vomiting.    Assessment & Plan:   Principal Problem:   Pleural effusion Active Problems:   Decompensated hepatic cirrhosis (HCC)   Ascites due to alcoholic cirrhosis (HCC)   Alcohol use disorder   Hypertension   COPD (chronic obstructive pulmonary disease) (HCC)   Iron  deficiency anemia   Portal hypertension (HCC)   Colon cancer (HCC)   Esophageal varices (HCC)  # Ascites # Pleural effusions Secondary to decompensated alcohol cirrhosis, end-stage. Not compliant with home meds. Hospice appropriate. S/p 9 liter paracetnesis on 10/7 - therapeutic thoracentesis ordered - continue home lasix  40 - add spiro 100 - palliative consult - 2 g sodium fluid restricted diet  # Alcohol cirrhosis # Esophageal varices Decompensated as above. No report of bleeding. Does not appear encephalopathic - diuresis  as above  # COPD Quiescent - breo for home symbicort   # Colon cancer S/p palliative xrt, not candidate for surgery or systemic therapy - surveilled by oncology, due for f/u with them this month  # Alcohol abuse Reports last alcohol over a week ago. Doesn't appear to be withdrawing - vitamins - ciwa  # HTN Bp mild elevation - d/c home lisinopril  given severe ascites  # Debility - PT eval   DVT prophylaxis: lovenox  Code Status: full Family Communication: niece updated at bedside  Level of care: Telemetry Medical Status is: Observation    Consultants:  none  Procedures: pending  Antimicrobials:  none    Subjective: Reports feeling fine but some dyspnea  Objective: Vitals:   04/20/24 0600 04/20/24 0800 04/20/24 0900 04/20/24 0927  BP: 126/86 118/84 (!) 136/90 (!) 136/90  Pulse: (!) 116 (!) 117 (!) 115   Resp: 19 16 13    Temp:      TempSrc:      SpO2: 99% 99% 100%   Weight:      Height:       No intake or output data in the 24 hours ending 04/20/24 1004 Filed Weights   04/19/24 1612  Weight: 102 kg    Examination:  General exam: Appears calm and comfortable, chronically ill appearing Respiratory system: rales and decreased sounds at bases Cardiovascular system: S1 & S2 heard, RRR. No JVD, murmurs, rubs, gallops or clicks.   Gastrointestinal system: Abdomen is severely distended Central nervous system: Alert and oriented. No focal neurological deficits. Extremities: severe pitting edema to abdomen Skin: No rashes, lesions or ulcers Psychiatry: Judgement and insight appear normal.  Mood & affect appropriate.     Data Reviewed: I have personally reviewed following labs and imaging studies  CBC: Recent Labs  Lab 04/18/24 1653 04/19/24 1624 04/20/24 0551  WBC 5.2 4.4 4.4  HGB 8.6* 8.2* 8.2*  HCT 25.8* 26.6* 25.3*  MCV 95.9 100.8* 97.7  PLT 189 143* 163   Basic Metabolic Panel: Recent Labs  Lab 04/18/24 1653 04/19/24 1624  04/20/24 0551  NA 129* 127* 129*  K 3.5 3.2* 3.3*  CL 97* 96* 99  CO2 24 23 26   GLUCOSE 111* 111* 107*  BUN 7* 7* 9  CREATININE 0.48* 0.68 0.67  CALCIUM 7.5* 7.2* 7.0*  MG  --  1.5*  --    GFR: Estimated Creatinine Clearance: 111.2 mL/min (by C-G formula based on SCr of 0.67 mg/dL). Liver Function Tests: Recent Labs  Lab 04/18/24 1653 04/19/24 1624 04/20/24 0551  AST 34 30 30  ALT 14 11 13   ALKPHOS 59 47 41  BILITOT 3.1* 2.2* 2.2*  PROT 7.8 6.6 6.3*  ALBUMIN  1.7* 1.6* <1.5*   No results for input(s): LIPASE, AMYLASE in the last 168 hours. No results for input(s): AMMONIA in the last 168 hours. Coagulation Profile: Recent Labs  Lab 04/19/24 1624 04/20/24 0551  INR 1.7* 1.6*   Cardiac Enzymes: No results for input(s): CKTOTAL, CKMB, CKMBINDEX, TROPONINI in the last 168 hours. BNP (last 3 results) No results for input(s): PROBNP in the last 8760 hours. HbA1C: No results for input(s): HGBA1C in the last 72 hours. CBG: No results for input(s): GLUCAP in the last 168 hours. Lipid Profile: No results for input(s): CHOL, HDL, LDLCALC, TRIG, CHOLHDL, LDLDIRECT in the last 72 hours. Thyroid Function Tests: No results for input(s): TSH, T4TOTAL, FREET4, T3FREE, THYROIDAB in the last 72 hours. Anemia Panel: No results for input(s): VITAMINB12, FOLATE, FERRITIN, TIBC, IRON , RETICCTPCT in the last 72 hours. Urine analysis:    Component Value Date/Time   COLORURINE AMBER (A) 12/13/2023 1616   APPEARANCEUR CLEAR (A) 12/13/2023 1616   LABSPEC 1.019 12/13/2023 1616   PHURINE 5.0 12/13/2023 1616   GLUCOSEU NEGATIVE 12/13/2023 1616   HGBUR MODERATE (A) 12/13/2023 1616   BILIRUBINUR NEGATIVE 12/13/2023 1616   KETONESUR NEGATIVE 12/13/2023 1616   PROTEINUR 30 (A) 12/13/2023 1616   NITRITE NEGATIVE 12/13/2023 1616   LEUKOCYTESUR NEGATIVE 12/13/2023 1616   Sepsis Labs: @LABRCNTIP (procalcitonin:4,lacticidven:4)  )No  results found for this or any previous visit (from the past 240 hours).       Radiology Studies: CT Angio Chest Pulmonary Embolism (PE) W or WO Contrast Result Date: 04/19/2024 CLINICAL DATA:  Pulmonary embolus suspected with high probability. Shortness of breath and leg swelling. EXAM: CT ANGIOGRAPHY CHEST WITH CONTRAST TECHNIQUE: Multidetector CT imaging of the chest was performed using the standard protocol during bolus administration of intravenous contrast. Multiplanar CT image reconstructions and MIPs were obtained to evaluate the vascular anatomy. RADIATION DOSE REDUCTION: This exam was performed according to the departmental dose-optimization program which includes automated exposure control, adjustment of the mA and/or kV according to patient size and/or use of iterative reconstruction technique. CONTRAST:  75mL OMNIPAQUE  IOHEXOL  350 MG/ML SOLN COMPARISON:  Chest radiograph 04/18/2024.  CT chest 12/16/2023 FINDINGS: Cardiovascular: Technically adequate study with moderately good opacification of the central pulmonary arteries. Moderate to significant motion artifact in some areas. As visualized, no large central pulmonary emboli are demonstrated. Peripheral emboli could be obscured due to technique. Cardiac enlargement. No pericardial effusions. Normal caliber thoracic aorta. No aortic dissection. Calcification of the  aorta and coronary arteries. Great vessel origins are patent. Mediastinum/Nodes: Thyroid gland is unremarkable. Esophagus is decompressed. No significant lymphadenopathy. Lungs/Pleura: Large bilateral pleural effusions, slightly greater on the left. Atelectasis or consolidation in both lower lungs may represent pneumonia or compressive atelectasis. Patchy airspace disease in both lungs could represent multifocal pneumonia or edema. There is a focal nodular ground-glass lesion in the right apex measuring 1.4 cm diameter. Given the presence of infiltrates elsewhere in the lungs, this is  likely infectious or inflammatory but follow-up after resolution of acute process is recommended to exclude underlying ground-glass lesion. No pneumothorax. Upper Abdomen: Hepatic cirrhosis with enlarged spleen and prominent upper abdominal ascites. Cholelithiasis. Musculoskeletal: Degenerative changes in the spine. No acute bony abnormalities. Review of the MIP images confirms the above findings. IMPRESSION: 1. No evidence of significant central pulmonary embolus. Visualization of subsegmental vessels is technically limited. 2. Large bilateral pleural effusions with basilar atelectasis or consolidation. Patchy airspace infiltrates in both lungs may represent pneumonia or edema. 3. 1.4 cm nodular ground-glass lesion in the right apex is likely infiltrative/infectious but follow-up is recommended to exclude underlying ground-glass lesion. Initial follow-up with CT at 6 months is recommended to confirm persistence. If persistent, repeat CT is recommended every 2 years until 5 years of stability has been established. This recommendation follows the consensus statement: Guidelines for Management of Incidental Pulmonary Nodules Detected on CT Images: From the Fleischner Society 2017; Radiology 2017; 284:228-243. 4. Hepatic cirrhosis with splenic enlargement and prominent upper abdominal ascites. 5. Cholelithiasis. Electronically Signed   By: Elsie Gravely M.D.   On: 04/19/2024 15:25   DG Chest 2 View Result Date: 04/18/2024 CLINICAL DATA:  Shortness of breath EXAM: CHEST - 2 VIEW COMPARISON:  CT chest 12/16/2023.  Chest radiograph 12/10/2023 FINDINGS: Shallow inspiration. Heart size and pulmonary vascularity are normal for technique. There is interval development of a moderate left pleural effusion with basilar and perihilar opacities. This may represent compressive atelectasis and/or pneumonia. No pneumothorax. Small right pleural effusion with basilar atelectasis or infiltration. Degenerative changes in the  spine. IMPRESSION: Interval development of moderate left and small right pleural effusion with bilateral basilar atelectasis or infiltration, also greater on the left. Electronically Signed   By: Elsie Gravely M.D.   On: 04/18/2024 18:29        Scheduled Meds:  aspirin  EC  81 mg Oral Daily   enoxaparin  (LOVENOX ) injection  40 mg Subcutaneous QHS   ferrous sulfate   325 mg Oral BID   fluticasone furoate-vilanterol  1 puff Inhalation Daily   folic acid   1 mg Oral Daily   furosemide   40 mg Oral Daily   lisinopril   20 mg Oral Daily   montelukast   10 mg Oral QHS   multivitamin with minerals  1 tablet Oral Daily   pantoprazole   40 mg Oral BID   sodium chloride  flush  3 mL Intravenous Q12H   thiamine   100 mg Oral Daily   Continuous Infusions:   LOS: 0 days     Devaughn KATHEE Ban, MD [facility]   If 7PM-7AM, please contact night-coverage [email or web address] Password [email or web address] 04/20/2024, 10:04 AM

## 2024-04-20 NOTE — Progress Notes (Signed)
 PT Cancellation Note  Patient Details Name: Joel Harrell MRN: [id number] DOB: 08-Jun-1956   Cancelled Treatment:    Reason Eval/Treat Not Completed: Patient declined, no reason specified Patient declined mobility, family in room states he is supposed to have another procedure at 3pm (thoracentesis). Will re-attempt at later date.   [name] 04/20/2024, 2:37 PM

## 2024-04-20 NOTE — Plan of Care (Signed)

## 2024-04-21 ENCOUNTER — Inpatient Hospital Stay

## 2024-04-21 DIAGNOSIS — J90 Pleural effusion, not elsewhere classified: Secondary | ICD-10-CM | POA: Diagnosis not present

## 2024-04-21 LAB — BODY FLUID CELL COUNT WITH DIFFERENTIAL
Eos, Fluid: 0 %
Lymphs, Fluid: 71 %
Monocyte-Macrophage-Serous Fluid: 27 %
Neutrophil Count, Fluid: 2 %
Total Nucleated Cell Count, Fluid: 323 uL

## 2024-04-21 LAB — BASIC METABOLIC PANEL WITH GFR
Anion gap: 7 (ref 5–15)
BUN: 9 mg/dL (ref 8–23)
CO2: 25 mmol/L (ref 22–32)
Calcium: 7 mg/dL — ABNORMAL LOW (ref 8.9–10.3)
Chloride: 100 mmol/L (ref 98–111)
Creatinine, Ser: 0.51 mg/dL — ABNORMAL LOW (ref 0.61–1.24)
GFR, Estimated: >60 mL/min (ref >60)
Glucose, Bld: 96 mg/dL (ref 70–99)
Potassium: 3 mmol/L — ABNORMAL LOW (ref 3.5–5.1)
Sodium: 132 mmol/L — ABNORMAL LOW (ref 135–145)

## 2024-04-21 LAB — CBC
HCT: 25.9 % — ABNORMAL LOW (ref 39.0–52.0)
Hemoglobin: 8.7 g/dL — ABNORMAL LOW (ref 13.0–17.0)
MCH: 32.2 pg (ref 26.0–34.0)
MCHC: 33.6 g/dL (ref 30.0–36.0)
MCV: 95.9 fL (ref 80.0–100.0)
Platelets: 166 K/uL (ref 150–400)
RBC: 2.7 MIL/uL — ABNORMAL LOW (ref 4.22–5.81)
RDW: 17.9 % — ABNORMAL HIGH (ref 11.5–15.5)
WBC: 4.1 K/uL (ref 4.0–10.5)
nRBC: 0 % (ref 0.0–0.2)

## 2024-04-21 LAB — MAGNESIUM: Magnesium: 1.3 mg/dL — ABNORMAL LOW (ref 1.7–2.4)

## 2024-04-21 LAB — TROPONIN I (HIGH SENSITIVITY): Troponin I (High Sensitivity): 5 ng/L (ref <18)

## 2024-04-21 MED ORDER — POTASSIUM CHLORIDE 20 MEQ PO PACK
60.0000 meq | PACK | Freq: Once | ORAL | Status: AC
  Administered 2024-04-21: 60 meq via ORAL
  Filled 2024-04-21: qty 3

## 2024-04-21 MED ORDER — LACTULOSE 10 GM/15ML PO SOLN
10.0000 g | Freq: Every day | ORAL | Status: DC
  Administered 2024-04-21 – 2024-04-23 (×3): 10 g via ORAL
  Filled 2024-04-21 (×3): qty 30

## 2024-04-21 MED ORDER — LIDOCAINE HCL (PF) 1 % IJ SOLN
10.0000 mL | Freq: Once | INTRAMUSCULAR | Status: AC
Start: 2024-04-21 — End: 2024-04-21
  Administered 2024-04-21: 10 mL via INTRADERMAL

## 2024-04-21 MED ORDER — MAGNESIUM SULFATE 4 GM/100ML IV SOLN
4.0000 g | Freq: Once | INTRAVENOUS | Status: AC
  Administered 2024-04-21: 4 g via INTRAVENOUS
  Filled 2024-04-21: qty 100

## 2024-04-21 MED ORDER — ALBUMIN HUMAN 25 % IV SOLN: 50.0000 g | Freq: Once | INTRAVENOUS | Status: DC | PRN

## 2024-04-21 NOTE — Plan of Care (Signed)

## 2024-04-21 NOTE — Procedures (Signed)
 PROCEDURE SUMMARY:  Successful US  guided paracentesis from left lateral abdomen.  Yielded 3.5 liters of blood tinged fluid.  No immediate complications.  Patient tolerated well.  EBL = trace  Specimen sent for labs.  Jedrick Hutcherson CHRISTELLA Bal PA-C 04/21/2024 5:59 PM

## 2024-04-21 NOTE — Plan of Care (Signed)

## 2024-04-21 NOTE — Progress Notes (Addendum)
 PROGRESS NOTE    Joel Harrell  FMW:[id number] DOB: 03/11/1956 DOA: 04/18/2024 PCP: Patient, No Pcp Per  Outpatient Specialists: oncology    Brief Narrative:   From admission h and p  Joel Harrell is a 68 y.o. male with medical history significant of hypertension, anemia, cirrhosis, portal hypertension, ascites, hepatic cephalopathy, colon cancer, COPD, alcohol use presenting with worsening shortness of breath and edema.   Patient initially presented 10/7 with 1 month of worsening lower extremity edema and shortness of breath.  Also worsening abdominal distention.  Taking medications as prescribed.   Found to have significant ascites and underwent paracentesis with 9 L removed and albumin  given.  Family reports patient refusing home health and continuing to drink with decreased ability for family to help care for him at home.   PT/OT consulted in the ED.  Patient noted to have persistent shortness of breath, tachycardia, edema in the ED.  Some component of this is chronic but some worse per patient.   Denies fevers, chills, chest pain, abdominal pain, constipation, diarrhea, nausea, vomiting.    Assessment & Plan:   Principal Problem:   Pleural effusion Active Problems:   Decompensated hepatic cirrhosis (HCC)   Ascites due to alcoholic cirrhosis (HCC)   Alcohol use disorder   Hypertension   COPD (chronic obstructive pulmonary disease) (HCC)   Iron  deficiency anemia   Portal hypertension (HCC)   Colon cancer (HCC)   Esophageal varices (HCC)  # Ascites # Pleural effusions Secondary to decompensated alcohol cirrhosis, end-stage. Not compliant with home meds. Hospice appropriate. S/p 9 liter paracetnesis on 10/7. 1.1 liter thora on 10/9. - repeat paracentesis ordered - continue home lasix  40 - added spiro 100 - palliative consult - 2 g sodium fluid restricted diet  # Alcohol cirrhosis # Esophageal varices Decompensated as above. No report of bleeding. Some confusion -  diuresis as above - start lactulose   # Hypokalemia # Hypomagnesmia - replete and monitor  # COPD Quiescent - breo for home symbicort   # Colon cancer S/p palliative xrt, not candidate for surgery or systemic therapy - surveilled by oncology, due for f/u with them this month  # Alcohol abuse Reports last alcohol over a week ago but per son patient tends to minimize. Doesn't appear to be withdrawing - vitamins - ciwa lorazepam   # HTN Bp mild elevation - d/c home lisinopril  given severe ascites  # Debility - PT eval advising snf   DVT prophylaxis: lovenox  Code Status: full Family Communication: wife telephonically 10/10  Level of care: Telemetry Medical Status is: Observation    Consultants:  palliative  Procedures: Thoracentesis 10/9  Antimicrobials:  none    Subjective: Reports feeling fine but some shallow breathing, no abd pain  Objective: Vitals:   04/20/24 2203 04/21/24 0135 04/21/24 0755 04/21/24 1104  BP: 128/88 119/70 116/75 122/82  Pulse: (!) 119 (!) 117 (!) 113 (!) 116  Resp: 17 16 18 18   Temp: 98.6 F (37 C) 98.3 F (36.8 C) 97.6 F (36.4 C) 97.8 F (36.6 C)  TempSrc: Oral Oral    SpO2: 100% 98% 98% 98%  Weight:      Height:        Intake/Output Summary (Last 24 hours) at 04/21/2024 1320 Last data filed at 04/21/2024 1303 Gross per 24 hour  Intake --  Output 700 ml  Net -700 ml   Filed Weights   04/19/24 1612  Weight: 102 kg    Examination:  General exam: Appears calm and comfortable, chronically  ill appearing Respiratory system: rales and decreased sounds at bases Cardiovascular system: S1 & S2 heard, RRR. No JVD, murmurs, rubs, gallops or clicks.   Gastrointestinal system: Abdomen is severely distended, not tender Central nervous system: Alert and oriented. No focal neurological deficits. Extremities: severe pitting edema to abdomen Skin: No rashes, lesions or ulcers Psychiatry: Judgement and insight appear normal. Mood  & affect appropriate.     Data Reviewed: I have personally reviewed following labs and imaging studies  CBC: Recent Labs  Lab 04/18/24 1653 04/19/24 1624 04/20/24 0551 04/21/24 0605  WBC 5.2 4.4 4.4 4.1  HGB 8.6* 8.2* 8.2* 8.7*  HCT 25.8* 26.6* 25.3* 25.9*  MCV 95.9 100.8* 97.7 95.9  PLT 189 143* 163 166   Basic Metabolic Panel: Recent Labs  Lab 04/18/24 1653 04/19/24 1624 04/20/24 0551 04/21/24 0605  NA 129* 127* 129* 132*  K 3.5 3.2* 3.3* 3.0*  CL 97* 96* 99 100  CO2 24 23 26 25   GLUCOSE 111* 111* 107* 96  BUN 7* 7* 9 9  CREATININE 0.48* 0.68 0.67 0.51*  CALCIUM 7.5* 7.2* 7.0* 7.0*  MG  --  1.5*  --  1.3*   GFR: Estimated Creatinine Clearance: 111.2 mL/min (A) (by C-G formula based on SCr of 0.51 mg/dL (L)). Liver Function Tests: Recent Labs  Lab 04/18/24 1653 04/19/24 1624 04/20/24 0551  AST 34 30 30  ALT 14 11 13   ALKPHOS 59 47 41  BILITOT 3.1* 2.2* 2.2*  PROT 7.8 6.6 6.3*  ALBUMIN  1.7* 1.6* <1.5*   No results for input(s): LIPASE, AMYLASE in the last 168 hours. No results for input(s): AMMONIA in the last 168 hours. Coagulation Profile: Recent Labs  Lab 04/19/24 1624 04/20/24 0551  INR 1.7* 1.6*   Cardiac Enzymes: No results for input(s): CKTOTAL, CKMB, CKMBINDEX, TROPONINI in the last 168 hours. BNP (last 3 results) No results for input(s): PROBNP in the last 8760 hours. HbA1C: No results for input(s): HGBA1C in the last 72 hours. CBG: No results for input(s): GLUCAP in the last 168 hours. Lipid Profile: No results for input(s): CHOL, HDL, LDLCALC, TRIG, CHOLHDL, LDLDIRECT in the last 72 hours. Thyroid Function Tests: No results for input(s): TSH, T4TOTAL, FREET4, T3FREE, THYROIDAB in the last 72 hours. Anemia Panel: No results for input(s): VITAMINB12, FOLATE, FERRITIN, TIBC, IRON , RETICCTPCT in the last 72 hours. Urine analysis:    Component Value Date/Time   COLORURINE AMBER (A)  12/13/2023 1616   APPEARANCEUR CLEAR (A) 12/13/2023 1616   LABSPEC 1.019 12/13/2023 1616   PHURINE 5.0 12/13/2023 1616   GLUCOSEU NEGATIVE 12/13/2023 1616   HGBUR MODERATE (A) 12/13/2023 1616   BILIRUBINUR NEGATIVE 12/13/2023 1616   KETONESUR NEGATIVE 12/13/2023 1616   PROTEINUR 30 (A) 12/13/2023 1616   NITRITE NEGATIVE 12/13/2023 1616   LEUKOCYTESUR NEGATIVE 12/13/2023 1616   Sepsis Labs: @LABRCNTIP (procalcitonin:4,lacticidven:4)  ) Recent Results (from the past 240 hours)  Body fluid culture w Gram Stain     Status: None (Preliminary result)   Collection Time: 04/20/24  4:31 PM   Specimen: PATH Cytology Pleural fluid  Result Value Ref Range Status   Specimen Description   Final    PLEURAL Performed at [facility], [address], [place], [place] [address]    Special Requests   Final    NONE Performed at [facility], [address] [place], [place] [address]    Gram Stain NO WBC SEEN NO ORGANISMS SEEN   Final   Culture   Final  NO GROWTH < 24 HOURS Performed at [facility], [address], [place], [place] [address]    Report Status PENDING  Incomplete         Radiology Studies: US  THORACENTESIS ASP PLEURAL SPACE W/IMG GUIDE Result Date: 04/21/2024 INDICATION: [id number] Dyspnea [id number] Pleural effusion 7160 68 year old male with known ETOH cirrhosis with recurrent ascites, admitted for dyspnea and lower extremity edema, and diagnosed with bilateral pleural effusions. IR was requested for diagnostic and therapeutic thoracentesis. EXAM: ULTRASOUND GUIDED DIAGNOSTIC AND THERAPEUTIC LEFT THORACENTESIS MEDICATIONS: 5 cc of 1% lidocaine  with epi. COMPLICATIONS: None immediate. PROCEDURE: An ultrasound guided thoracentesis was thoroughly discussed with the patient and questions answered. The benefits, risks, alternatives and complications were also discussed. The patient understands and wishes to proceed with the procedure. Written consent  was obtained. Ultrasound was performed to localize and mark an adequate pocket of fluid in the left chest. The area was then prepped and draped in the normal sterile fashion. 1% Lidocaine  was used for local anesthesia. Under ultrasound guidance a 6 Fr Safe-T-Centesis catheter was introduced. Thoracentesis was performed. The catheter was removed and a dressing applied. FINDINGS: A total of approximately 1.1 L of clear, blood-laden pleural fluid was removed. Samples were sent to the laboratory as requested by the clinical team. IMPRESSION: Successful ultrasound guided LEFT thoracentesis yielding 1.1 L of pleural fluid. Procedure performed by Carlin Griffon, PA-C under direct supervision of Thom Hall, MD Electronically Signed   By: Thom Hall M.D.   On: 04/21/2024 08:03   DG Chest Port 1 View Result Date: 04/20/2024 CLINICAL DATA:  Status post thoracentesis. EXAM: PORTABLE CHEST 1 VIEW COMPARISON:  April 18, 2024. FINDINGS: No pneumothorax is noted status post thoracentesis. Minimal bibasilar subsegmental atelectasis is noted. Minimal left pleural effusion may be present. IMPRESSION: No pneumothorax status post thoracentesis. Minimal bibasilar subsegmental atelectasis. Electronically Signed   By: Lynwood Landy Raddle M.D.   On: 04/20/2024 17:30   ECHOCARDIOGRAM COMPLETE Result Date: 04/20/2024    ECHOCARDIOGRAM REPORT   Patient Name:   LEONTE HORRIGAN Date of Exam: 04/20/2024 Medical Rec #:  [id number]   Height:       75.0 in Accession #:    [id number]  Weight:       224.8 lb Date of Birth:  [birth year]   BSA:          2.306 m Patient Age:    68 years    BP:           126/86 mmHg Patient Gender: M           HR:           116 bpm. Exam Location:  [facility] Procedure: 2D Echo, Cardiac Doppler and Color Doppler (Both Spectral and Color            Flow Doppler were utilized during procedure). Indications:     Dyspnea R06.00  History:         Patient has no prior history of Echocardiogram examinations.                  CHF; Risk  Factors:Hypertension.  Sonographer:     Christopher Furnace Referring Phys:  [id number] MARSA NOVAK MELVIN Diagnosing Phys: MARSA Dooms MD  Sonographer Comments: Suboptimal parasternal window. IMPRESSIONS  1. Left ventricular ejection fraction, by estimation, is 60 to 65%. The left ventricle has normal function. The left ventricle has no regional wall motion abnormalities. Left ventricular diastolic parameters are consistent with Grade I  diastolic dysfunction (impaired relaxation).  2. Right ventricular systolic function is normal. The right ventricular size is normal.  3. The mitral valve is normal in structure. Mild to moderate mitral valve regurgitation. No evidence of mitral stenosis.  4. The aortic valve is normal in structure. Aortic valve regurgitation is not visualized. No aortic stenosis is present.  5. The inferior vena cava is normal in size with greater than 50% respiratory variability, suggesting right atrial pressure of 3 mmHg. FINDINGS  Left Ventricle: Left ventricular ejection fraction, by estimation, is 60 to 65%. The left ventricle has normal function. The left ventricle has no regional wall motion abnormalities. Strain was performed and the global longitudinal strain is indeterminate. The left ventricular internal cavity size was normal in size. There is no left ventricular hypertrophy. Left ventricular diastolic parameters are consistent with Grade I diastolic dysfunction (impaired relaxation). Right Ventricle: The right ventricular size is normal. No increase in right ventricular wall thickness. Right ventricular systolic function is normal. Left Atrium: Left atrial size was normal in size. Right Atrium: Right atrial size was normal in size. Pericardium: There is no evidence of pericardial effusion. Mitral Valve: The mitral valve is normal in structure. Mild to moderate mitral valve regurgitation. No evidence of mitral valve stenosis. Tricuspid Valve: The tricuspid valve is normal in structure.  Tricuspid valve regurgitation is trivial. No evidence of tricuspid stenosis. Aortic Valve: The aortic valve is normal in structure. Aortic valve regurgitation is not visualized. No aortic stenosis is present. Aortic valve mean gradient measures 4.0 mmHg. Aortic valve peak gradient measures 6.9 mmHg. Aortic valve area, by VTI measures 3.05 cm. Pulmonic Valve: The pulmonic valve was normal in structure. Pulmonic valve regurgitation is not visualized. No evidence of pulmonic stenosis. Aorta: The aortic root is normal in size and structure. Venous: The inferior vena cava is normal in size with greater than 50% respiratory variability, suggesting right atrial pressure of 3 mmHg. IAS/Shunts: No atrial level shunt detected by color flow Doppler. Additional Comments: 3D was performed not requiring image post processing on an independent workstation and was indeterminate.  LEFT VENTRICLE PLAX 2D LVIDd:         4.60 cm   Diastology LVIDs:         3.40 cm   LV e' medial:    17.00 cm/s LV PW:         1.00 cm   LV E/e' medial:  4.2 LV IVS:        1.30 cm   LV e' lateral:   6.74 cm/s LVOT diam:     2.20 cm   LV E/e' lateral: 10.7 LV SV:         63 LV SV Index:   28 LVOT Area:     3.80 cm  RIGHT VENTRICLE RV Basal diam:  2.60 cm RV Mid diam:    2.50 cm LEFT ATRIUM           Index        RIGHT ATRIUM           Index LA diam:      3.00 cm 1.30 cm/m   RA Area:     10.50 cm LA Vol (A2C): 14.1 ml 6.11 ml/m   RA Volume:   18.20 ml  7.89 ml/m LA Vol (A4C): 38.0 ml 16.48 ml/m  AORTIC VALVE AV Area (Vmax):    2.62 cm AV Area (Vmean):   2.54 cm AV Area (VTI):     3.05 cm AV  Vmax:           131.00 cm/s AV Vmean:          89.500 cm/s AV VTI:            0.208 m AV Peak Grad:      6.9 mmHg AV Mean Grad:      4.0 mmHg LVOT Vmax:         90.20 cm/s LVOT Vmean:        59.700 cm/s LVOT VTI:          0.167 m LVOT/AV VTI ratio: 0.80  AORTA Ao Root diam: 3.80 cm MITRAL VALVE                TRICUSPID VALVE MV Area (PHT): 2.75 cm     TR Peak  grad:   10.2 mmHg MV Decel Time: 276 msec     TR Vmax:        160.00 cm/s MV E velocity: 71.80 cm/s MV A velocity: 111.00 cm/s  SHUNTS MV E/A ratio:  0.65         Systemic VTI:  0.17 m                             Systemic Diam: 2.20 cm Marsa Dooms MD Electronically signed by Marsa Dooms MD Signature Date/Time: 04/20/2024/1:05:09 PM    Final    CT Angio Chest Pulmonary Embolism (PE) W or WO Contrast Result Date: 04/19/2024 CLINICAL DATA:  Pulmonary embolus suspected with high probability. Shortness of breath and leg swelling. EXAM: CT ANGIOGRAPHY CHEST WITH CONTRAST TECHNIQUE: Multidetector CT imaging of the chest was performed using the standard protocol during bolus administration of intravenous contrast. Multiplanar CT image reconstructions and MIPs were obtained to evaluate the vascular anatomy. RADIATION DOSE REDUCTION: This exam was performed according to the departmental dose-optimization program which includes automated exposure control, adjustment of the mA and/or kV according to patient size and/or use of iterative reconstruction technique. CONTRAST:  75mL OMNIPAQUE  IOHEXOL  350 MG/ML SOLN COMPARISON:  Chest radiograph 04/18/2024.  CT chest 12/16/2023 FINDINGS: Cardiovascular: Technically adequate study with moderately good opacification of the central pulmonary arteries. Moderate to significant motion artifact in some areas. As visualized, no large central pulmonary emboli are demonstrated. Peripheral emboli could be obscured due to technique. Cardiac enlargement. No pericardial effusions. Normal caliber thoracic aorta. No aortic dissection. Calcification of the aorta and coronary arteries. Great vessel origins are patent. Mediastinum/Nodes: Thyroid gland is unremarkable. Esophagus is decompressed. No significant lymphadenopathy. Lungs/Pleura: Large bilateral pleural effusions, slightly greater on the left. Atelectasis or consolidation in both lower lungs may represent pneumonia or  compressive atelectasis. Patchy airspace disease in both lungs could represent multifocal pneumonia or edema. There is a focal nodular ground-glass lesion in the right apex measuring 1.4 cm diameter. Given the presence of infiltrates elsewhere in the lungs, this is likely infectious or inflammatory but follow-up after resolution of acute process is recommended to exclude underlying ground-glass lesion. No pneumothorax. Upper Abdomen: Hepatic cirrhosis with enlarged spleen and prominent upper abdominal ascites. Cholelithiasis. Musculoskeletal: Degenerative changes in the spine. No acute bony abnormalities. Review of the MIP images confirms the above findings. IMPRESSION: 1. No evidence of significant central pulmonary embolus. Visualization of subsegmental vessels is technically limited. 2. Large bilateral pleural effusions with basilar atelectasis or consolidation. Patchy airspace infiltrates in both lungs may represent pneumonia or edema. 3. 1.4 cm nodular ground-glass lesion in the right apex is likely  infiltrative/infectious but follow-up is recommended to exclude underlying ground-glass lesion. Initial follow-up with CT at 6 months is recommended to confirm persistence. If persistent, repeat CT is recommended every 2 years until 5 years of stability has been established. This recommendation follows the consensus statement: Guidelines for Management of Incidental Pulmonary Nodules Detected on CT Images: From the Fleischner Society 2017; Radiology 2017; 284:228-243. 4. Hepatic cirrhosis with splenic enlargement and prominent upper abdominal ascites. 5. Cholelithiasis. Electronically Signed   By: Elsie Gravely M.D.   On: 04/19/2024 15:25        Scheduled Meds:  aspirin  EC  81 mg Oral Daily   enoxaparin  (LOVENOX ) injection  40 mg Subcutaneous QHS   ferrous sulfate   325 mg Oral BID   fluticasone furoate-vilanterol  1 puff Inhalation Daily   folic acid   1 mg Oral Daily   furosemide   40 mg Oral Daily    LORazepam   0-4 mg Oral Q6H   Followed by   NOREEN ON 04/22/2024] LORazepam   0-4 mg Oral Q12H   montelukast   10 mg Oral QHS   multivitamin with minerals  1 tablet Oral Daily   pantoprazole   40 mg Oral BID   sodium chloride  flush  3 mL Intravenous Q12H   spironolactone  100 mg Oral Daily   thiamine   100 mg Oral Daily   Or   thiamine   100 mg Intravenous Daily   Continuous Infusions:  albumin  human     magnesium  sulfate bolus IVPB       LOS: 1 day     Devaughn KATHEE Ban, MD [facility]   If 7PM-7AM, please contact night-coverage [email or web address] Password [email or web address] 04/21/2024, 1:20 PM

## 2024-04-21 NOTE — Progress Notes (Signed)
 PT Cancellation Note  Patient Details Name: Joel Harrell MRN: [id number] DOB: 04/20/1956   Cancelled Treatment:    Reason Eval/Treat Not Completed: Fatigue/lethargy limiting ability to participate. Treatment attempted. Pt unable to wake to verbal stimulation, even from family member suspect d/t medication. Pt too lethargic to participate. Will re-attempt tomorrow.   Aylla Huffine, SPT  Lilyauna Miedema 04/21/2024, 3:03 PM

## 2024-04-21 NOTE — Progress Notes (Addendum)
 Daily Progress Note   Patient Name: Rodric Punch       Date: 04/21/2024 DOB: [birth year]  Age: 68 y.o. MRN#: [id number] Attending Physician: Kandis Devaughn Sayres, MD Primary Care Physician: Patient, No Pcp Per Admit Date: 04/18/2024  Reason for Consultation/Follow-up: Establishing goals of care  Subjective: Notes and labs reviewed. In demographics yesterday, Rosaline was listed as patient's friend. Today demographics have been updated where she is listed as his wife, and with his last name. Patient with additional 1.1 L removed by IR via paracentesis.  In to see patient.  He is currently resting in bed at this time.  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  He states he is legally married, and his wife works part time, and has health issues herself.  He states he has children.  He states his son Yitzhak is his healthcare power of attorney, but he has changed his mind, and moving forward, he no longer wants his son to be surrogate decision maker, or to be called regarding his care.  He states he wants his wife Rosaline to be his surrogate decision maker, if one is needed in the future.  Restated and confirmed with him that he no longer wants his son Zarian contacted regarding his care, and wants Rosaline called if needed.   He discusses his health and states that his quality of life is poor.  He states he goes to doctors appointments, but spends most of his time sitting in his recliner.  We discussed his diagnoses, prognosis, GOC, EOL wishes disposition and options.  A detailed discussion was had today regarding advanced directives.  Concepts specific to code status, artifical feeding and hydration, IV antibiotics and rehospitalization were discussed.  The difference between an aggressive medical intervention  path and a comfort care path was discussed.  Values and goals of care important to patient and family were attempted to be elicited.  Discussed limitations of medical interventions to prolong quality of life in some situations and discussed the concept of human mortality.  He states he has spoken with hospice before in the past, and states he lived across the road from the hospice on [address]  He states he would be interested in speaking with them again.  He states he is considering DNR/DNI and comfort focused care, but would  like to speak with them prior to making any plans or changes.  He also states he would like facility placement as he knows his needs are increasing.   He discusses his health care Length of Stay: 1  Current Medications: Scheduled Meds:   aspirin  EC  81 mg Oral Daily   enoxaparin  (LOVENOX ) injection  40 mg Subcutaneous QHS   ferrous sulfate   325 mg Oral BID   fluticasone furoate-vilanterol  1 puff Inhalation Daily   folic acid   1 mg Oral Daily   furosemide   40 mg Oral Daily   lactulose   10 g Oral Daily   LORazepam   0-4 mg Oral Q6H   Followed by   NOREEN ON 04/22/2024] LORazepam   0-4 mg Oral Q12H   montelukast   10 mg Oral QHS   multivitamin with minerals  1 tablet Oral Daily   pantoprazole   40 mg Oral BID   sodium chloride  flush  3 mL Intravenous Q12H   spironolactone  100 mg Oral Daily   thiamine   100 mg Oral Daily   Or   thiamine   100 mg Intravenous Daily    Continuous Infusions:  albumin  human     magnesium  sulfate bolus IVPB      PRN Meds: acetaminophen  **OR** acetaminophen , albumin  human, albuterol , alum & mag hydroxide-simeth, bisacodyl , LORazepam  **OR** LORazepam , polyethylene glycol  Physical Exam Pulmonary:     Effort: Pulmonary effort is normal.  Abdominal:     Comments: Protuberant  Skin:    General: Skin is warm and dry.  Neurological:     Mental Status: He is alert.             Vital Signs: BP 122/82 (BP Location: Right Arm)    Pulse (!) 116   Temp 97.8 F (36.6 C)   Resp 18   Ht 6' 3 (1.905 m)   Wt 102 kg   SpO2 98%   BMI 28.10 kg/m  SpO2: SpO2: 98 % O2 Device: O2 Device: Room Air O2 Flow Rate:    Intake/output summary:  Intake/Output Summary (Last 24 hours) at 04/21/2024 1334 Last data filed at 04/21/2024 1303 Gross per 24 hour  Intake --  Output 700 ml  Net -700 ml   LBM: Last BM Date :  (before admit) Baseline Weight: Weight: 102 kg Most recent weight: Weight: 102 kg   Patient Active Problem List   Diagnosis Date Noted   Esophageal varices (HCC) 04/20/2024   Pleural effusion 04/19/2024   Palliative care encounter 12/15/2023   Colon cancer (HCC) 12/14/2023   Adenocarcinoma of sigmoid colon (HCC) 12/14/2023   Goals of care, counseling/discussion 12/14/2023   Overweight (BMI 25.0-29.9) 12/13/2023   Neoplasm of digestive system 12/13/2023   Mass of colon 12/13/2023   Hypokalemia 12/12/2023   Hyponatremia 12/12/2023   Hypomagnesemia 12/12/2023   Hypophosphatemia 12/12/2023   Portal hypertension (HCC) 12/12/2023   Ascites due to alcoholic cirrhosis (HCC) 12/11/2023   Essential hypertension 12/11/2023   Pancytopenia (HCC) 12/11/2023   Acute blood loss anemia 12/11/2023   Iron  deficiency anemia 12/11/2023   GI bleeding 12/10/2023   Acute hepatic encephalopathy (HCC) 11/22/2022   Iron  deficiency anemia due to chronic blood loss 11/22/2022   Decompensated hepatic cirrhosis (HCC) 11/22/2022   Alcohol use disorder 11/22/2022   Hypertension 11/22/2022   COPD (chronic obstructive pulmonary disease) (HCC) 11/22/2022    Palliative Care Assessment & Plan    Recommendations/Plan: Patient is considering DNR/DNI and comfort focused care.  He requests to speak with hospice  liaison to making any changes or any plans. He states he is interested in facility placement as he knows his needs are increasing. He advises he would want his wife to be his surrogate decision maker, and does not want his  son contacted moving forward.   Code Status:    Code Status Orders  (From admission, onward)           Start     Ordered   04/19/24 1545  Full code  Continuous       Question:  By:  Answer:  Consent: discussion documented in EHR   04/19/24 1601           Code Status History     Date Active Date Inactive Code Status Order ID Comments User Context   12/11/2023 0028 12/22/2023 2240 Full Code [id number]  Lawence Madison LABOR, MD ED   11/22/2022 0155 11/23/2022 1555 Full Code [id number]  Cleatus Delayne GAILS, MD ED    Camelia Lewis, NP  Please contact Palliative Medicine Team phone at [phone] for questions and concerns.

## 2024-04-22 DIAGNOSIS — Z66 Do not resuscitate: Secondary | ICD-10-CM

## 2024-04-22 DIAGNOSIS — J90 Pleural effusion, not elsewhere classified: Secondary | ICD-10-CM | POA: Diagnosis not present

## 2024-04-22 DIAGNOSIS — R600 Localized edema: Secondary | ICD-10-CM

## 2024-04-22 DIAGNOSIS — R0602 Shortness of breath: Secondary | ICD-10-CM

## 2024-04-22 DIAGNOSIS — Z515 Encounter for palliative care: Secondary | ICD-10-CM

## 2024-04-22 LAB — CBC
HCT: 24.6 % — ABNORMAL LOW (ref 39.0–52.0)
Hemoglobin: 8 g/dL — ABNORMAL LOW (ref 13.0–17.0)
MCH: 31.5 pg (ref 26.0–34.0)
MCHC: 32.5 g/dL (ref 30.0–36.0)
MCV: 96.9 fL (ref 80.0–100.0)
Platelets: 155 K/uL (ref 150–400)
RBC: 2.54 MIL/uL — ABNORMAL LOW (ref 4.22–5.81)
RDW: 17.7 % — ABNORMAL HIGH (ref 11.5–15.5)
WBC: 3.9 K/uL — ABNORMAL LOW (ref 4.0–10.5)
nRBC: 0 % (ref 0.0–0.2)

## 2024-04-22 LAB — BASIC METABOLIC PANEL WITH GFR
Anion gap: 6 (ref 5–15)
BUN: 10 mg/dL (ref 8–23)
CO2: 27 mmol/L (ref 22–32)
Calcium: 7 mg/dL — ABNORMAL LOW (ref 8.9–10.3)
Chloride: 98 mmol/L (ref 98–111)
Creatinine, Ser: 0.65 mg/dL (ref 0.61–1.24)
GFR, Estimated: >60 mL/min (ref >60)
Glucose, Bld: 96 mg/dL (ref 70–99)
Potassium: 3.1 mmol/L — ABNORMAL LOW (ref 3.5–5.1)
Sodium: 131 mmol/L — ABNORMAL LOW (ref 135–145)

## 2024-04-22 LAB — LACTATE DEHYDROGENASE: LDH: 141 U/L (ref 98–192)

## 2024-04-22 LAB — MAGNESIUM: Magnesium: 1.6 mg/dL — ABNORMAL LOW (ref 1.7–2.4)

## 2024-04-22 MED ORDER — GLYCOPYRROLATE 0.2 MG/ML IJ SOLN: 0.2000 mg | INTRAMUSCULAR | Status: DC | PRN

## 2024-04-22 MED ORDER — POLYVINYL ALCOHOL 1.4 % OP SOLN: 1.0000 [drp] | Freq: Four times a day (QID) | OPHTHALMIC | Status: DC | PRN

## 2024-04-22 MED ORDER — POTASSIUM CHLORIDE 20 MEQ PO PACK
80.0000 meq | PACK | Freq: Once | ORAL | Status: DC
  Filled 2024-04-22: qty 4

## 2024-04-22 MED ORDER — HALOPERIDOL LACTATE 2 MG/ML PO CONC: 2.0000 mg | Freq: Four times a day (QID) | ORAL | Status: DC | PRN

## 2024-04-22 MED ORDER — LORAZEPAM 2 MG/ML IJ SOLN: 1.0000 mg | INTRAMUSCULAR | Status: DC | PRN

## 2024-04-22 MED ORDER — DIPHENHYDRAMINE HCL 50 MG/ML IJ SOLN
25.0000 mg | INTRAMUSCULAR | Status: DC | PRN
Start: 2024-04-22 — End: 2024-04-23

## 2024-04-22 MED ORDER — HALOPERIDOL LACTATE 5 MG/ML IJ SOLN: 2.0000 mg | Freq: Four times a day (QID) | INTRAMUSCULAR | Status: DC | PRN

## 2024-04-22 MED ORDER — ONDANSETRON HCL 4 MG/2ML IJ SOLN: 4.0000 mg | Freq: Four times a day (QID) | INTRAMUSCULAR | Status: DC | PRN

## 2024-04-22 MED ORDER — ONDANSETRON 4 MG PO TBDP: 4.0000 mg | ORAL_TABLET | Freq: Four times a day (QID) | ORAL | Status: DC | PRN

## 2024-04-22 MED ORDER — GLYCOPYRROLATE 1 MG PO TABS: 1.0000 mg | ORAL_TABLET | ORAL | Status: DC | PRN

## 2024-04-22 MED ORDER — HALOPERIDOL 0.5 MG PO TABS: 2.0000 mg | ORAL_TABLET | Freq: Four times a day (QID) | ORAL | Status: DC | PRN

## 2024-04-22 MED ORDER — MORPHINE SULFATE (PF) 2 MG/ML IV SOLN
1.0000 mg | INTRAVENOUS | Status: DC | PRN
  Administered 2024-04-22 – 2024-04-23 (×2): 2 mg via INTRAVENOUS
  Filled 2024-04-22 (×3): qty 1

## 2024-04-22 MED ORDER — MAGNESIUM SULFATE 4 GM/100ML IV SOLN
4.0000 g | Freq: Once | INTRAVENOUS | Status: AC
  Administered 2024-04-22: 4 g via INTRAVENOUS
  Filled 2024-04-22: qty 100

## 2024-04-22 MED ORDER — LORAZEPAM 2 MG/ML IJ SOLN
1.0000 mg | INTRAMUSCULAR | Status: DC | PRN
  Administered 2024-04-23: 1 mg via INTRAVENOUS
  Filled 2024-04-22: qty 1

## 2024-04-22 MED ORDER — BIOTENE DRY MOUTH MT LIQD
15.0000 mL | Freq: Two times a day (BID) | OROMUCOSAL | Status: DC
Start: 2024-04-22 — End: 2024-04-23
  Administered 2024-04-22 – 2024-04-23 (×2): 15 mL via TOPICAL

## 2024-04-22 NOTE — Progress Notes (Addendum)
 Palliative Care Progress Note, Assessment & Plan   Patient Name: Joel Harrell       Date: 04/22/2024 DOB: April 24, 1956  Age: 68 y.o. MRN#: [id number] Attending Physician: Kandis Devaughn Sayres, MD Primary Care Physician: Patient, No Pcp Per Admit Date: 04/18/2024  Subjective: Feels ok today. Denies pain.  HPI: Per previous HPI: Joel Harrell is a 68 y.o. male with medical history significant of hypertension, anemia, cirrhosis, portal hypertension, ascites, hepatic cephalopathy, colon cancer, COPD, alcohol use presenting with worsening shortness of breath and edema.  Patient underwent paracentesis with a total of around 9 L of fluid removed.  Palliative care consulted for assistance with goals of care conversations.     Summary of counseling/coordination of care: Extensive chart review completed prior to meeting patient including labs, vital signs, imaging, progress notes, orders, and available advanced directive documents from current and previous encounters.     Latest Ref Rng & Units 04/22/2024    3:32 AM 04/21/2024    6:05 AM 04/20/2024    5:51 AM  CBC  WBC 4.0 - 10.5 K/uL 3.9  4.1  4.4   Hemoglobin 13.0 - 17.0 g/dL 8.0  8.7  8.2   Hematocrit 39.0 - 52.0 % 24.6  25.9  25.3   Platelets 150 - 400 K/uL 155  166  163       Latest Ref Rng & Units 04/22/2024    3:32 AM 04/21/2024    6:05 AM 04/20/2024    5:51 AM  CMP  Glucose 70 - 99 mg/dL 96  96  892   BUN 8 - 23 mg/dL 10  9  9    Creatinine 0.61 - 1.24 mg/dL 9.34  9.48  9.32   Sodium 135 - 145 mmol/L 131  132  129   Potassium 3.5 - 5.1 mmol/L 3.1  3.0  3.3   Chloride 98 - 111 mmol/L 98  100  99   CO2 22 - 32 mmol/L 27  25  26    Calcium 8.9 - 10.3 mg/dL 7.0  7.0  7.0   Total Protein 6.5 - 8.1 g/dL   6.3   Total Bilirubin 0.0 - 1.2 mg/dL   2.2    Alkaline Phos 38 - 126 U/L   41   AST 15 - 41 U/L   30   ALT 0 - 44 U/L   13      After reviewing the patient's chart and assessing the patient at bedside, I spoke with patient and Joel Harrell (spouse) in regards to symptom management and goals of care.   Ill-appearing, frail, elderly male lying in bed. He awakens to verbal stimuli and answers a couple of questions but immediately falls back to sleep. He is unable to participate in goals of care conversations. Respirations are even and unlabored. He is in no distress.   Joel Harrell, spouse, shares that she has seen a significant decline in Joel Harrell over the past couple of days. We discussed the difficulty she has experienced at home trying to care for him with her current disabilities. He is no longer able to feed himself and is not eating very much. He is drinking some fluid. She shares concern over continually declining health  and the re accumulation of fluid in abdomen and lung.   Discussed due to overall poor health and prognosis, Joel Harrell is appropriate for comfort/end of life care. I explained comfort care as care where the patient would no longer receive aggressive medical interventions such as continuous vital signs, lab work, radiology testing, or medications not focused on comfort, peace, and dignity. This includes stopping antibiotics and weaning oxygen to room air, as these are generally not accepted as providing comfort but only prolonging the dying process artificially. All care would focus on how the patient is looking and feeling. This would include management of any symptoms that may cause discomfort, pain, shortness of breath/air hunger, increased work of breathing, cough, nausea, agitation/restlessness, anxiety, and/or secretions etc. Symptoms would be managed with medications and other non-pharmacological interventions such as spiritual support if requested. Joel Harrell verbalized understanding and agreement.   She requests that patient be  transitioned to comfort measures only and be evaluated for transfer to inpatient hospice home.  Joel Harrell shares that she will contact family members about plan to transfer to hospice.   Comfort measure orders placed. DNR signed and placed in paper chart.   Therapeutic silence and active listening provided for patient's spouse to share her thoughts and emotions regarding current medical situation.  Emotional support provided.  Physical Exam Vitals and nursing note reviewed.  Constitutional:      General: He is not in acute distress.    Appearance: He is ill-appearing.  HENT:     Head: Normocephalic and atraumatic.  Pulmonary:     Effort: Pulmonary effort is normal. No respiratory distress.  Abdominal:     General: There is distension.     Tenderness: There is no abdominal tenderness. There is no guarding.  Musculoskeletal:     Right lower leg: Edema present.     Left lower leg: Edema present.  Skin:    General: Skin is warm and dry.  Neurological:     Mental Status: He is disoriented.     Motor: Weakness present.     Recommendations/Plan:         Focus of care is comfort and dignity allowing for natural death Utilize ordered medications to maintain comfort Being evaluated for IPU hospice home placement   Total Time 65 minutes   Discussed plan of care with Dr. Kandis, [place], [facility], primary RN and [facility] liaison, Saddie, RN  Time spent includes: Detailed review of medical records (labs, imaging, vital signs), medically appropriate exam (mental status, respiratory, cardiac, skin), discussed with treatment team, counseling and educating patient, family and staff, documenting clinical information, medication management and coordination of care.     Joel Harrell, ELNITA- [facility] Palliative Medicine Team  04/22/2024 9:20 AM  Office [phone]  Pager [phone]

## 2024-04-22 NOTE — Progress Notes (Signed)
 PROGRESS NOTE    Cadell Gabrielson  FMW:[id number] DOB: 1956/01/05 DOA: 04/18/2024 PCP: Patient, No Pcp Per  Outpatient Specialists: oncology    Brief Narrative:   From admission h and p  Beau Ramsburg is a 68 y.o. male with medical history significant of hypertension, anemia, cirrhosis, portal hypertension, ascites, hepatic cephalopathy, colon cancer, COPD, alcohol use presenting with worsening shortness of breath and edema.   Patient initially presented 10/7 with 1 month of worsening lower extremity edema and shortness of breath.  Also worsening abdominal distention.  Taking medications as prescribed.   Found to have significant ascites and underwent paracentesis with 9 L removed and albumin  given.  Family reports patient refusing home health and continuing to drink with decreased ability for family to help care for him at home.   PT/OT consulted in the ED.  Patient noted to have persistent shortness of breath, tachycardia, edema in the ED.  Some component of this is chronic but some worse per patient.   Denies fevers, chills, chest pain, abdominal pain, constipation, diarrhea, nausea, vomiting.    Assessment & Plan:   Principal Problem:   Pleural effusion Active Problems:   Decompensated hepatic cirrhosis (HCC)   Ascites due to alcoholic cirrhosis (HCC)   Alcohol use disorder   Hypertension   COPD (chronic obstructive pulmonary disease) (HCC)   Iron  deficiency anemia   Portal hypertension (HCC)   Colon cancer (HCC)   Esophageal varices (HCC)  # Ascites # Pleural effusions Secondary to decompensated alcohol cirrhosis, end-stage. Not compliant with home meds. Hospice appropriate. S/p 9 liter paracetnesis on 10/7. 1.1 liter thora on 10/9. And 3.5 liter para on 10/10 - transitioned to full comfort care today after meeting with palliative and hospice - wife hopeful he will qualify for hospice home. I have made her aware if not we will need to make arrangements for hospice at home  #  Alcohol cirrhosis # Esophageal varices Decompensated as above. No report of bleeding. Some confusion  # Hypokalemia # Hypomagnesmia  # COPD Quiescent  # Colon cancer S/p palliative xrt, not candidate for surgery or systemic therapy  # Alcohol abuse Reports last alcohol over a week ago but per son patient tends to minimize. Doesn't appear to be withdrawing  # HTN Bp mild elevation - d/c home lisinopril  given severe ascites  DVT prophylaxis: none Code Status: full Family Communication: wife at bedside 10/11  Level of care: Telemetry Medical Status is: Observation    Consultants:  palliative  Procedures: See above  Antimicrobials:  none    Subjective: Sleeping after pain medication  Objective: Vitals:   04/22/24 0310 04/22/24 0447 04/22/24 0831 04/22/24 1243  BP:  122/73 125/79 107/77  Pulse: (!) 109  98 96  Resp:  18 18 16   Temp:  98.6 F (37 C) 98 F (36.7 C) 97.9 F (36.6 C)  TempSrc:      SpO2: 95% 99% 100% 100%  Weight:      Height:        Intake/Output Summary (Last 24 hours) at 04/22/2024 1316 Last data filed at 04/22/2024 0900 Gross per 24 hour  Intake 100 ml  Output 200 ml  Net -100 ml   Filed Weights   04/19/24 1612  Weight: 102 kg    Examination:  General exam: Appears calm and comfortable, chronically ill appearing Respiratory system: rales and decreased sounds at bases Cardiovascular system: S1 & S2 heard, RRR. No JVD, murmurs, rubs, gallops or clicks.   Gastrointestinal system: Abdomen  is distended, not tender Central nervous system: Asleep.   Extremities: severe pitting edema to abdomen Skin: No rashes, lesions or ulcers. Mild jaundice Psychiatry: calm   Data Reviewed: I have personally reviewed following labs and imaging studies  CBC: Recent Labs  Lab 04/18/24 1653 04/19/24 1624 04/20/24 0551 04/21/24 0605 04/22/24 0332  WBC 5.2 4.4 4.4 4.1 3.9*  HGB 8.6* 8.2* 8.2* 8.7* 8.0*  HCT 25.8* 26.6* 25.3* 25.9* 24.6*   MCV 95.9 100.8* 97.7 95.9 96.9  PLT 189 143* 163 166 155   Basic Metabolic Panel: Recent Labs  Lab 04/18/24 1653 04/19/24 1624 04/20/24 0551 04/21/24 0605 04/22/24 0332  NA 129* 127* 129* 132* 131*  K 3.5 3.2* 3.3* 3.0* 3.1*  CL 97* 96* 99 100 98  CO2 24 23 26 25 27   GLUCOSE 111* 111* 107* 96 96  BUN 7* 7* 9 9 10   CREATININE 0.48* 0.68 0.67 0.51* 0.65  CALCIUM 7.5* 7.2* 7.0* 7.0* 7.0*  MG  --  1.5*  --  1.3* 1.6*   GFR: Estimated Creatinine Clearance: 111.2 mL/min (by C-G formula based on SCr of 0.65 mg/dL). Liver Function Tests: Recent Labs  Lab 04/18/24 1653 04/19/24 1624 04/20/24 0551  AST 34 30 30  ALT 14 11 13   ALKPHOS 59 47 41  BILITOT 3.1* 2.2* 2.2*  PROT 7.8 6.6 6.3*  ALBUMIN  1.7* 1.6* <1.5*   No results for input(s): LIPASE, AMYLASE in the last 168 hours. No results for input(s): AMMONIA in the last 168 hours. Coagulation Profile: Recent Labs  Lab 04/19/24 1624 04/20/24 0551  INR 1.7* 1.6*   Cardiac Enzymes: No results for input(s): CKTOTAL, CKMB, CKMBINDEX, TROPONINI in the last 168 hours. BNP (last 3 results) No results for input(s): PROBNP in the last 8760 hours. HbA1C: No results for input(s): HGBA1C in the last 72 hours. CBG: No results for input(s): GLUCAP in the last 168 hours. Lipid Profile: No results for input(s): CHOL, HDL, LDLCALC, TRIG, CHOLHDL, LDLDIRECT in the last 72 hours. Thyroid Function Tests: No results for input(s): TSH, T4TOTAL, FREET4, T3FREE, THYROIDAB in the last 72 hours. Anemia Panel: No results for input(s): VITAMINB12, FOLATE, FERRITIN, TIBC, IRON , RETICCTPCT in the last 72 hours. Urine analysis:    Component Value Date/Time   COLORURINE AMBER (A) 12/13/2023 1616   APPEARANCEUR CLEAR (A) 12/13/2023 1616   LABSPEC 1.019 12/13/2023 1616   PHURINE 5.0 12/13/2023 1616   GLUCOSEU NEGATIVE 12/13/2023 1616   HGBUR MODERATE (A) 12/13/2023 1616   BILIRUBINUR  NEGATIVE 12/13/2023 1616   KETONESUR NEGATIVE 12/13/2023 1616   PROTEINUR 30 (A) 12/13/2023 1616   NITRITE NEGATIVE 12/13/2023 1616   LEUKOCYTESUR NEGATIVE 12/13/2023 1616   Sepsis Labs: @LABRCNTIP (procalcitonin:4,lacticidven:4)  ) Recent Results (from the past 240 hours)  Body fluid culture w Gram Stain     Status: None (Preliminary result)   Collection Time: 04/20/24  4:31 PM   Specimen: PATH Cytology Pleural fluid  Result Value Ref Range Status   Specimen Description   Final    PLEURAL Performed at [facility], [address] [place], [place] [address]    Special Requests   Final    NONE Performed at [facility], [address] [place], [place] [address]    Gram Stain NO WBC SEEN NO ORGANISMS SEEN   Final   Culture   Final    NO GROWTH 2 DAYS Performed at [facility], [address] [place], [place] [address]    Report Status PENDING  Incomplete  Body fluid culture w Gram Stain     Status: None (Preliminary result)   Collection Time: 04/21/24  5:02 PM   Specimen: PATH Cytology Peritoneal fluid  Result Value Ref Range Status   Specimen Description   Final    PERITONEAL Performed at [facility], [address] [place], [place] [address]    Special Requests   Final    NONE Performed at [facility], [address] [place], [place] [address]    Gram Stain NO WBC SEEN NO ORGANISMS SEEN   Final   Culture   Final    NO GROWTH < 12 HOURS Performed at [facility], [address], [place], [place] [address]    Report Status PENDING  Incomplete         Radiology Studies: US  THORACENTESIS ASP PLEURAL SPACE W/IMG GUIDE Result Date: 04/21/2024 INDICATION: [id number] Dyspnea [id number] Pleural effusion [id number] 68 year old male with known ETOH cirrhosis with recurrent ascites, admitted for dyspnea and lower extremity edema, and diagnosed with bilateral pleural effusions. IR was requested for diagnostic and  therapeutic thoracentesis. EXAM: ULTRASOUND GUIDED DIAGNOSTIC AND THERAPEUTIC LEFT THORACENTESIS MEDICATIONS: 5 cc of 1% lidocaine  with epi. COMPLICATIONS: None immediate. PROCEDURE: An ultrasound guided thoracentesis was thoroughly discussed with the patient and questions answered. The benefits, risks, alternatives and complications were also discussed. The patient understands and wishes to proceed with the procedure. Written consent was obtained. Ultrasound was performed to localize and mark an adequate pocket of fluid in the left chest. The area was then prepped and draped in the normal sterile fashion. 1% Lidocaine  was used for local anesthesia. Under ultrasound guidance a 6 Fr Safe-T-Centesis catheter was introduced. Thoracentesis was performed. The catheter was removed and a dressing applied. FINDINGS: A total of approximately 1.1 L of clear, blood-laden pleural fluid was removed. Samples were sent to the laboratory as requested by the clinical team. IMPRESSION: Successful ultrasound guided LEFT thoracentesis yielding 1.1 L of pleural fluid. Procedure performed by Carlin Griffon, PA-C under direct supervision of Thom Hall, MD Electronically Signed   By: Thom Hall M.D.   On: 04/21/2024 08:03   DG Chest Port 1 View Result Date: 04/20/2024 CLINICAL DATA:  Status post thoracentesis. EXAM: PORTABLE CHEST 1 VIEW COMPARISON:  April 18, 2024. FINDINGS: No pneumothorax is noted status post thoracentesis. Minimal bibasilar subsegmental atelectasis is noted. Minimal left pleural effusion may be present. IMPRESSION: No pneumothorax status post thoracentesis. Minimal bibasilar subsegmental atelectasis. Electronically Signed   By: Lynwood Landy Raddle M.D.   On: 04/20/2024 17:30        Scheduled Meds:  antiseptic oral rinse  15 mL Topical BID   fluticasone furoate-vilanterol  1 puff Inhalation Daily   lactulose   10 g Oral Daily   sodium chloride  flush  3 mL Intravenous Q12H   Continuous Infusions:      LOS: 2 days     Devaughn KATHEE Ban, MD [facility]   If 7PM-7AM, please contact night-coverage [email or web address] Password [email or web address] 04/22/2024, 1:16 PM

## 2024-04-22 NOTE — TOC Progression Note (Signed)
 Transition of Care [facility]) - Progression Note    Patient Details  Name: Joel Harrell MRN: [id number] Date of Birth: 08/19/1955  Transition of Care [facility] CM/SW Contact  [place] L Amatullah Christ[place], [place] Phone Number: 04/22/2024, 11:30 AM  Clinical Narrative:        CSW met with significant other at bedside. Patient was resting comfortably. Ms. Dross is not patient legal spouse however, patient previously advised that she and/or his son should be making decisions on his behalf.   Ms. Sobolewski advised that she spoke with someone from [facility]. She advised that she is wanting patient placed in IPU because if he is home, he would be drinking alcohol. Ms. Garman demeanor was calm and she appeared to be comfortable with the decision.   Ms. Peary gave verbal confirmation that choice is for [facility]. She advised that she lives across the street from the [facility].                  Expected Discharge Plan and Services                                               Social Drivers of Health (SDOH) Interventions SDOH Screenings   Food Insecurity: No Food Insecurity (04/20/2024)  Housing: Low Risk  (04/20/2024)  Transportation Needs: Unmet Transportation Needs (04/20/2024)  Utilities: Not At Risk (04/20/2024)  Social Connections: Moderately Isolated (04/20/2024)  Tobacco Use: Low Risk  (04/18/2024)    Readmission Risk Interventions    12/15/2023    8:53 AM  Readmission Risk Prevention Plan  PCP or Specialist Appt within 3-5 Days Complete  HRI or Home Care Consult Complete  Social Work Consult for Recovery Care Planning/Counseling Complete  Palliative Care Screening Not Applicable

## 2024-04-22 NOTE — Plan of Care (Signed)

## 2024-04-22 NOTE — Progress Notes (Signed)
 [facility] Liaison  Received request from [place], NP/PMT with notification to  [employer], Transitions of Care (TOC), for information/interest in hospice services after discharge. Spoke with Joel Harrell, spouse, to initiate education related to hospice philosophy, services, and team approach to care. Joel Harrell is interested in Hospice [facility]Patient [facility]).  Joel Harrell discussed patient is too much for her to care for at this point due to her disabilities.  Palliative Care has been working with patient/family.  Joel Harrell is interested in not escalating care and would like to transition to full comfort measures.  Communicated my conversation with covering palliative care provider- [name], Dr. Kandis, and [place] [facility]- of my visit and conversation.    Devere will see patient today and let me know the outcome of family discussion.  Above information shared with hospital medical care team.   Please call with any hospice related questions or concerns.  Thank you for the opportunity to participate in this patient's care.   [name], MA, BSN, RN, FNE Nurse Liaison [phone]

## 2024-04-22 NOTE — Progress Notes (Signed)
 Per chart review. PT orders discontinued 2/2 to pt going full comfort measures. Will complete orders and sign off.

## 2024-04-23 DIAGNOSIS — J90 Pleural effusion, not elsewhere classified: Secondary | ICD-10-CM | POA: Diagnosis not present

## 2024-04-23 NOTE — Discharge Summary (Signed)
 Joel Harrell FMW:[id number] DOB: 1955-12-23 DOA: 04/18/2024  PCP: Patient, No Pcp Per  Admit date: 04/18/2024 Discharge date: 04/23/2024  Time spent: 35 minutes    Discharge Diagnoses:  Principal Problem:   Pleural effusion Active Problems:   Decompensated hepatic cirrhosis (HCC)   Ascites due to alcoholic cirrhosis (HCC)   Alcohol use disorder   Hypertension   COPD (chronic obstructive pulmonary disease) (HCC)   Iron  deficiency anemia   Portal hypertension (HCC)   Colon cancer (HCC)   Esophageal varices (HCC)   Discharge Condition: stable  Diet recommendation: regular  Filed Weights   04/19/24 1612  Weight: 102 kg    History of present illness:  From admission h and p Joel Harrell is a 68 y.o. male with medical history significant of hypertension, anemia, cirrhosis, portal hypertension, ascites, hepatic cephalopathy, colon cancer, COPD, alcohol use presenting with worsening shortness of breath and edema.   Patient initially presented 10/7 with 1 month of worsening lower extremity edema and shortness of breath.  Also worsening abdominal distention.  Taking medications as prescribed.   Found to have significant ascites and underwent paracentesis with 9 L removed and albumin  given.  Family reports patient refusing home health and continuing to drink with decreased ability for family to help care for him at home.   PT/OT consulted in the ED.  Patient noted to have persistent shortness of breath, tachycardia, edema in the ED.  Some component of this is chronic but some worse per patient.   Denies fevers, chills, chest pain, abdominal pain, constipation, diarrhea, nausea, vomiting.    Hospital Course:   # Ascites # Pleural effusions Secondary to decompensated alcohol cirrhosis, end-stage. Not compliant with home meds. Hospice appropriate. S/p 9 liter paracetnesis on 10/7. 1.1 liter thora on 10/9. And 3.5 liter para on 10/10 - transitioned to full comfort care 10/11 after  meeting with palliative and hospice - discharged to hospice home   # Alcohol cirrhosis # Esophageal varices Decompensated as above. No report of bleeding. Some confusion   # Hypokalemia # Hypomagnesmia   # COPD Quiescent   # Colon cancer S/p palliative xrt, not candidate for surgery or systemic therapy   # Alcohol abuse Reports last alcohol over a week ago but per son patient tends to minimize. Doesn't appear to be withdrawing   # HTN  Procedures: Paracentesis, thoracentesis   Consultations: palliative  Discharge Exam: Vitals:   04/23/24 0431 04/23/24 0749  BP: 115/73 114/68  Pulse: (!) 103 100  Resp: 20 15  Temp: 97.9 F (36.6 C) 97.8 F (36.6 C)  SpO2: 99% 99%    General exam: Appears calm and comfortable, chronically ill appearing Respiratory system: rales and decreased sounds at bases Cardiovascular system: S1 & S2 heard, RRR. No JVD, murmurs, rubs, gallops or clicks.   Gastrointestinal system: Abdomen is distended, not tender Central nervous system: Asleep.   Extremities: severe pitting edema to abdomen Skin: No rashes, lesions or ulcers. Mild jaundice Psychiatry: calm  Discharge Instructions   Discharge Instructions     Diet general   Complete by: As directed    Increase activity slowly   Complete by: As directed    No wound care   Complete by: As directed       Allergies as of 04/23/2024   No Known Allergies      Medication List     STOP taking these medications    albuterol  108 (90 Base) MCG/ACT inhaler Commonly known as: VENTOLIN  HFA  aspirin  EC 81 MG tablet   bisacodyl  5 MG EC tablet Generic drug: bisacodyl    budesonide -formoterol  160-4.5 MCG/ACT inhaler Commonly known as: SYMBICORT    ferrous sulfate  325 (65 FE) MG EC tablet   folic acid  1 MG tablet Commonly known as: FOLVITE    furosemide  40 MG tablet Commonly known as: LASIX    lisinopril  20 MG tablet Commonly known as: ZESTRIL    montelukast  10 MG  tablet Commonly known as: Singulair    multivitamin with minerals Tabs tablet   pantoprazole  40 MG tablet Commonly known as: Protonix    polyethylene glycol powder 17 GM/SCOOP powder Commonly known as: GLYCOLAX /MIRALAX    thiamine  100 MG tablet Commonly known as: Vitamin B-1       No Known Allergies  Follow-up Information     Onita Elspeth Sharper, DO .   Specialty: Gastroenterology Contact information: [address] [facility] [place] [address] [phone]                  The results of significant diagnostics from this hospitalization (including imaging, microbiology, ancillary and laboratory) are listed below for reference.    Significant Diagnostic Studies: US  THORACENTESIS ASP PLEURAL SPACE W/IMG GUIDE Result Date: 04/21/2024 INDICATION: [id number] Dyspnea [id number] Pleural effusion 6524 68 year old male with known ETOH cirrhosis with recurrent ascites, admitted for dyspnea and lower extremity edema, and diagnosed with bilateral pleural effusions. IR was requested for diagnostic and therapeutic thoracentesis. EXAM: ULTRASOUND GUIDED DIAGNOSTIC AND THERAPEUTIC LEFT THORACENTESIS MEDICATIONS: 5 cc of 1% lidocaine  with epi. COMPLICATIONS: None immediate. PROCEDURE: An ultrasound guided thoracentesis was thoroughly discussed with the patient and questions answered. The benefits, risks, alternatives and complications were also discussed. The patient understands and wishes to proceed with the procedure. Written consent was obtained. Ultrasound was performed to localize and mark an adequate pocket of fluid in the left chest. The area was then prepped and draped in the normal sterile fashion. 1% Lidocaine  was used for local anesthesia. Under ultrasound guidance a 6 Fr Safe-T-Centesis catheter was introduced. Thoracentesis was performed. The catheter was removed and a dressing applied. FINDINGS: A total of approximately 1.1 L of clear, blood-laden pleural fluid  was removed. Samples were sent to the laboratory as requested by the clinical team. IMPRESSION: Successful ultrasound guided LEFT thoracentesis yielding 1.1 L of pleural fluid. Procedure performed by Carlin Griffon, PA-C under direct supervision of Thom Hall, MD Electronically Signed   By: Thom Hall M.D.   On: 04/21/2024 08:03   DG Chest Port 1 View Result Date: 04/20/2024 CLINICAL DATA:  Status post thoracentesis. EXAM: PORTABLE CHEST 1 VIEW COMPARISON:  April 18, 2024. FINDINGS: No pneumothorax is noted status post thoracentesis. Minimal bibasilar subsegmental atelectasis is noted. Minimal left pleural effusion may be present. IMPRESSION: No pneumothorax status post thoracentesis. Minimal bibasilar subsegmental atelectasis. Electronically Signed   By: Lynwood Landy Raddle M.D.   On: 04/20/2024 17:30   ECHOCARDIOGRAM COMPLETE Result Date: 04/20/2024    ECHOCARDIOGRAM REPORT   Patient Name:   EATHAN GROMAN Date of Exam: 04/20/2024 Medical Rec #:  [id number]   Height:       75.0 in Accession #:    [id number]  Weight:       224.8 lb Date of Birth:  Jul 31, 1955   BSA:          2.306 m Patient Age:    68 years    BP:           126/86 mmHg Patient Gender: M  HR:           116 bpm. Exam Location:  [facility] Procedure: 2D Echo, Cardiac Doppler and Color Doppler (Both Spectral and Color            Flow Doppler were utilized during procedure). Indications:     Dyspnea R06.00  History:         Patient has no prior history of Echocardiogram examinations.                  CHF; Risk Factors:Hypertension.  Sonographer:     Christopher Furnace Referring Phys:  [id number] MARSA NOVAK MELVIN Diagnosing Phys: MARSA Dooms MD  Sonographer Comments: Suboptimal parasternal window. IMPRESSIONS  1. Left ventricular ejection fraction, by estimation, is 60 to 65%. The left ventricle has normal function. The left ventricle has no regional wall motion abnormalities. Left ventricular diastolic parameters are consistent with Grade I diastolic  dysfunction (impaired relaxation).  2. Right ventricular systolic function is normal. The right ventricular size is normal.  3. The mitral valve is normal in structure. Mild to moderate mitral valve regurgitation. No evidence of mitral stenosis.  4. The aortic valve is normal in structure. Aortic valve regurgitation is not visualized. No aortic stenosis is present.  5. The inferior vena cava is normal in size with greater than 50% respiratory variability, suggesting right atrial pressure of 3 mmHg. FINDINGS  Left Ventricle: Left ventricular ejection fraction, by estimation, is 60 to 65%. The left ventricle has normal function. The left ventricle has no regional wall motion abnormalities. Strain was performed and the global longitudinal strain is indeterminate. The left ventricular internal cavity size was normal in size. There is no left ventricular hypertrophy. Left ventricular diastolic parameters are consistent with Grade I diastolic dysfunction (impaired relaxation). Right Ventricle: The right ventricular size is normal. No increase in right ventricular wall thickness. Right ventricular systolic function is normal. Left Atrium: Left atrial size was normal in size. Right Atrium: Right atrial size was normal in size. Pericardium: There is no evidence of pericardial effusion. Mitral Valve: The mitral valve is normal in structure. Mild to moderate mitral valve regurgitation. No evidence of mitral valve stenosis. Tricuspid Valve: The tricuspid valve is normal in structure. Tricuspid valve regurgitation is trivial. No evidence of tricuspid stenosis. Aortic Valve: The aortic valve is normal in structure. Aortic valve regurgitation is not visualized. No aortic stenosis is present. Aortic valve mean gradient measures 4.0 mmHg. Aortic valve peak gradient measures 6.9 mmHg. Aortic valve area, by VTI measures 3.05 cm. Pulmonic Valve: The pulmonic valve was normal in structure. Pulmonic valve regurgitation is not  visualized. No evidence of pulmonic stenosis. Aorta: The aortic root is normal in size and structure. Venous: The inferior vena cava is normal in size with greater than 50% respiratory variability, suggesting right atrial pressure of 3 mmHg. IAS/Shunts: No atrial level shunt detected by color flow Doppler. Additional Comments: 3D was performed not requiring image post processing on an independent workstation and was indeterminate.  LEFT VENTRICLE PLAX 2D LVIDd:         4.60 cm   Diastology LVIDs:         3.40 cm   LV e' medial:    17.00 cm/s LV PW:         1.00 cm   LV E/e' medial:  4.2 LV IVS:        1.30 cm   LV e' lateral:   6.74 cm/s LVOT diam:     2.20 cm  LV E/e' lateral: 10.7 LV SV:         63 LV SV Index:   28 LVOT Area:     3.80 cm  RIGHT VENTRICLE RV Basal diam:  2.60 cm RV Mid diam:    2.50 cm LEFT ATRIUM           Index        RIGHT ATRIUM           Index LA diam:      3.00 cm 1.30 cm/m   RA Area:     10.50 cm LA Vol (A2C): 14.1 ml 6.11 ml/m   RA Volume:   18.20 ml  7.89 ml/m LA Vol (A4C): 38.0 ml 16.48 ml/m  AORTIC VALVE AV Area (Vmax):    2.62 cm AV Area (Vmean):   2.54 cm AV Area (VTI):     3.05 cm AV Vmax:           131.00 cm/s AV Vmean:          89.500 cm/s AV VTI:            0.208 m AV Peak Grad:      6.9 mmHg AV Mean Grad:      4.0 mmHg LVOT Vmax:         90.20 cm/s LVOT Vmean:        59.700 cm/s LVOT VTI:          0.167 m LVOT/AV VTI ratio: 0.80  AORTA Ao Root diam: 3.80 cm MITRAL VALVE                TRICUSPID VALVE MV Area (PHT): 2.75 cm     TR Peak grad:   10.2 mmHg MV Decel Time: 276 msec     TR Vmax:        160.00 cm/s MV E velocity: 71.80 cm/s MV A velocity: 111.00 cm/s  SHUNTS MV E/A ratio:  0.65         Systemic VTI:  0.17 m                             Systemic Diam: 2.20 cm Marsa Dooms MD Electronically signed by Marsa Dooms MD Signature Date/Time: 04/20/2024/1:05:09 PM    Final    CT Angio Chest Pulmonary Embolism (PE) W or WO Contrast Result Date:  04/19/2024 CLINICAL DATA:  Pulmonary embolus suspected with high probability. Shortness of breath and leg swelling. EXAM: CT ANGIOGRAPHY CHEST WITH CONTRAST TECHNIQUE: Multidetector CT imaging of the chest was performed using the standard protocol during bolus administration of intravenous contrast. Multiplanar CT image reconstructions and MIPs were obtained to evaluate the vascular anatomy. RADIATION DOSE REDUCTION: This exam was performed according to the departmental dose-optimization program which includes automated exposure control, adjustment of the mA and/or kV according to patient size and/or use of iterative reconstruction technique. CONTRAST:  75mL OMNIPAQUE  IOHEXOL  350 MG/ML SOLN COMPARISON:  Chest radiograph 04/18/2024.  CT chest 12/16/2023 FINDINGS: Cardiovascular: Technically adequate study with moderately good opacification of the central pulmonary arteries. Moderate to significant motion artifact in some areas. As visualized, no large central pulmonary emboli are demonstrated. Peripheral emboli could be obscured due to technique. Cardiac enlargement. No pericardial effusions. Normal caliber thoracic aorta. No aortic dissection. Calcification of the aorta and coronary arteries. Great vessel origins are patent. Mediastinum/Nodes: Thyroid gland is unremarkable. Esophagus is decompressed. No significant lymphadenopathy. Lungs/Pleura: Large bilateral pleural effusions, slightly greater on the left.  Atelectasis or consolidation in both lower lungs may represent pneumonia or compressive atelectasis. Patchy airspace disease in both lungs could represent multifocal pneumonia or edema. There is a focal nodular ground-glass lesion in the right apex measuring 1.4 cm diameter. Given the presence of infiltrates elsewhere in the lungs, this is likely infectious or inflammatory but follow-up after resolution of acute process is recommended to exclude underlying ground-glass lesion. No pneumothorax. Upper Abdomen:  Hepatic cirrhosis with enlarged spleen and prominent upper abdominal ascites. Cholelithiasis. Musculoskeletal: Degenerative changes in the spine. No acute bony abnormalities. Review of the MIP images confirms the above findings. IMPRESSION: 1. No evidence of significant central pulmonary embolus. Visualization of subsegmental vessels is technically limited. 2. Large bilateral pleural effusions with basilar atelectasis or consolidation. Patchy airspace infiltrates in both lungs may represent pneumonia or edema. 3. 1.4 cm nodular ground-glass lesion in the right apex is likely infiltrative/infectious but follow-up is recommended to exclude underlying ground-glass lesion. Initial follow-up with CT at 6 months is recommended to confirm persistence. If persistent, repeat CT is recommended every 2 years until 5 years of stability has been established. This recommendation follows the consensus statement: Guidelines for Management of Incidental Pulmonary Nodules Detected on CT Images: From the Fleischner Society 2017; Radiology 2017; 284:228-243. 4. Hepatic cirrhosis with splenic enlargement and prominent upper abdominal ascites. 5. Cholelithiasis. Electronically Signed   By: Elsie Gravely M.D.   On: 04/19/2024 15:25   DG Chest 2 View Result Date: 04/18/2024 CLINICAL DATA:  Shortness of breath EXAM: CHEST - 2 VIEW COMPARISON:  CT chest 12/16/2023.  Chest radiograph 12/10/2023 FINDINGS: Shallow inspiration. Heart size and pulmonary vascularity are normal for technique. There is interval development of a moderate left pleural effusion with basilar and perihilar opacities. This may represent compressive atelectasis and/or pneumonia. No pneumothorax. Small right pleural effusion with basilar atelectasis or infiltration. Degenerative changes in the spine. IMPRESSION: Interval development of moderate left and small right pleural effusion with bilateral basilar atelectasis or infiltration, also greater on the left.  Electronically Signed   By: Elsie Gravely M.D.   On: 04/18/2024 18:29    Microbiology: Recent Results (from the past 240 hours)  Body fluid culture w Gram Stain     Status: None (Preliminary result)   Collection Time: 04/20/24  4:31 PM   Specimen: PATH Cytology Pleural fluid  Result Value Ref Range Status   Specimen Description   Final    PLEURAL Performed at [facility], [address], [place], [place] [address]    Special Requests   Final    NONE Performed at [facility], [address] [place], [place] [address]    Gram Stain NO WBC SEEN NO ORGANISMS SEEN   Final   Culture   Final    NO GROWTH 3 DAYS Performed at [facility], [address], [place], [place] [address]    Report Status PENDING  Incomplete  Body fluid culture w Gram Stain     Status: None (Preliminary result)   Collection Time: 04/21/24  5:02 PM   Specimen: PATH Cytology Peritoneal fluid  Result Value Ref Range Status   Specimen Description   Final    PERITONEAL Performed at [facility], [address], [place], [place] [address]    Special Requests   Final    NONE Performed at [facility], [address] [place], [place] [address]    Gram Stain NO WBC SEEN NO ORGANISMS SEEN   Final  Culture   Final    NO GROWTH < 12 HOURS Performed at [facility], [address] [place], [place] [address]    Report Status PENDING  Incomplete     Labs: Basic Metabolic Panel: Recent Labs  Lab 04/18/24 1653 04/19/24 1624 04/20/24 0551 04/21/24 0605 04/22/24 0332  NA 129* 127* 129* 132* 131*  K 3.5 3.2* 3.3* 3.0* 3.1*  CL 97* 96* 99 100 98  CO2 24 23 26 25 27   GLUCOSE 111* 111* 107* 96 96  BUN 7* 7* 9 9 10   CREATININE 0.48* 0.68 0.67 0.51* 0.65  CALCIUM 7.5* 7.2* 7.0* 7.0* 7.0*  MG  --  1.5*  --  1.3* 1.6*   Liver Function Tests: Recent Labs  Lab 04/18/24 1653 04/19/24 1624 04/20/24 0551  AST 34 30 30  ALT 14 11 13   ALKPHOS 59 47  41  BILITOT 3.1* 2.2* 2.2*  PROT 7.8 6.6 6.3*  ALBUMIN  1.7* 1.6* <1.5*   No results for input(s): LIPASE, AMYLASE in the last 168 hours. No results for input(s): AMMONIA in the last 168 hours. CBC: Recent Labs  Lab 04/18/24 1653 04/19/24 1624 04/20/24 0551 04/21/24 0605 04/22/24 0332  WBC 5.2 4.4 4.4 4.1 3.9*  HGB 8.6* 8.2* 8.2* 8.7* 8.0*  HCT 25.8* 26.6* 25.3* 25.9* 24.6*  MCV 95.9 100.8* 97.7 95.9 96.9  PLT 189 143* 163 166 155   Cardiac Enzymes: No results for input(s): CKTOTAL, CKMB, CKMBINDEX, TROPONINI in the last 168 hours. BNP: BNP (last 3 results) Recent Labs    12/10/23 2213 12/10/23 2321 04/18/24 1653  BNP 161.2* 135.8* 154.9*    ProBNP (last 3 results) No results for input(s): PROBNP in the last 8760 hours.  CBG: No results for input(s): GLUCAP in the last 168 hours.     Signed:  Devaughn KATHEE Ban MD.  [facility] 04/23/2024, 11:25 AM

## 2024-04-23 NOTE — Progress Notes (Signed)
 Palliative Care Progress Note, Assessment & Plan   Patient Name: Joel Harrell       Date: 04/23/2024 DOB: [birth year]  Age: 68 y.o. MRN#: [id number] Attending Physician: Kandis Devaughn Sayres, MD Primary Care Physician: Patient, No Pcp Per Admit Date: 04/18/2024  Subjective: Denies pain. Slept off and on overnight.   HPI: Per previous HPI: Joel Harrell is a 68 y.o. male with medical history significant of hypertension, anemia, cirrhosis, portal hypertension, ascites, hepatic cephalopathy, colon cancer, COPD, alcohol use presenting with worsening shortness of breath and edema.  Patient underwent paracentesis with a total of around 9 L of fluid removed.   Palliative care consulted for assistance with goals of care conversations.  Summary of counseling/coordination of care: Extensive chart review completed prior to meeting patient including labs, vital signs, imaging, progress notes, orders, and available advanced directive documents from current and previous encounters.   After reviewing the patient's chart and assessing the patient at bedside, I spoke with patient's spouse in regards to symptom management and goals of care.   Ill-appearing, elderly male lying in bed. He awakes to verbal stimuli and is able to answer a few questions, but immediately falls back to sleep. He is not able to participate in conversation today. Respirations are even and unlabored. He is in no distress. Patient's spouse at bedside.   Joel Harrell shares that Joel Harrell was very agitated this morning but resolved after medication was given. She was able to get him to eat a few bites of breakfast today. She is hopeful that Joel Harrell will be able to transfer to hospice home today. Advised that he was accepted to hospice [facility] but currently waiting on bed.    Therapeutic silence and active listening provided for Joel Harrell to share her thoughts and emotions regarding current medical situation.  Emotional support provided.  Physical Exam Vitals and nursing note reviewed.  Constitutional:      General: He is not in acute distress.    Appearance: He is ill-appearing.     Comments: Cachetic   HENT:     Head: Normocephalic and atraumatic.     Mouth/Throat:     Mouth: Mucous membranes are dry.  Pulmonary:     Effort: Pulmonary effort is normal. No respiratory distress.  Abdominal:     General: There is distension.  Musculoskeletal:     Right lower leg: Edema present.     Left lower leg: Edema present.  Skin:    General: Skin is warm and dry.  Neurological:     Mental Status: He is disoriented.     Motor: Weakness present.     Recommendations/Plan:         Focus of care is comfort and dignity allowing for natural death Utilize ordered medications to maintain comfort Accepted to [facility] with plan to transfer today         Total Time 50 minutes   Discussed plan of care with Dr. Kandis, Saddie, [facility] liaison, TOC and primary RN.  Time spent includes: Detailed review of medical records (labs, imaging, vital signs), medically appropriate exam (mental status, respiratory, cardiac, skin), discussed with treatment team, counseling and educating patient, family and staff, documenting clinical information, medication management and coordination  of care.     Joel Harrell, [facility] Palliative Medicine Team  04/23/2024 8:53 AM  Office [phone]  Pager [phone]

## 2024-04-23 NOTE — Plan of Care (Signed)

## 2024-04-23 NOTE — Progress Notes (Addendum)
 [facility] Liaison Note  Follow up on new referral for Hospice I[facility].  Patient will transfer to the IPU today once patient's wife, Darice completes consents. Updated MAR sent to the IPU.  [facility] RN to call report to the Hospice Home/IPU @ [phone]  Please medicate the patient prior to EMS transport as needed for comfort during transport.  Please ensure portable DNR accompanies patient at discharge  This RN will call and arrange [facility] transport.  Please do not hesitate to call with any hospice related questions or concerns.  [name], RN Nurse Liaison [phone]

## 2024-04-23 NOTE — Progress Notes (Signed)
 Report was called to [facility] at [phone]. Let them know pick up time was 1:00 pm

## 2024-04-24 LAB — PATHOLOGIST SMEAR REVIEW

## 2024-04-24 LAB — BODY FLUID CULTURE W GRAM STAIN
Culture: NO GROWTH
Gram Stain: NONE SEEN

## 2024-04-25 LAB — BODY FLUID CULTURE W GRAM STAIN
Culture: NO GROWTH
Gram Stain: NONE SEEN

## 2024-04-25 LAB — CYTOLOGY - NON PAP

## 2024-05-03 ENCOUNTER — Inpatient Hospital Stay: Admitting: Hospice and Palliative Medicine

## 2024-05-03 ENCOUNTER — Inpatient Hospital Stay: Admitting: Oncology

## 2024-05-03 ENCOUNTER — Inpatient Hospital Stay

## 2024-06-13 ENCOUNTER — Encounter: Payer: Self-pay | Admitting: Oncology

## 2024-06-13 ENCOUNTER — Other Ambulatory Visit: Payer: Self-pay | Admitting: Hospice and Palliative Medicine

## 2024-06-13 DIAGNOSIS — R188 Other ascites: Secondary | ICD-10-CM

## 2024-06-14 ENCOUNTER — Ambulatory Visit: Admission: RE | Admit: 2024-06-14 | Discharge: 2024-06-14 | Attending: Hospice and Palliative Medicine

## 2024-06-14 DIAGNOSIS — R188 Other ascites: Secondary | ICD-10-CM

## 2024-06-14 MED ORDER — LIDOCAINE 1 % OPTIME INJ - NO CHARGE
10.0000 mL | Freq: Once | INTRAMUSCULAR | Status: AC
  Administered 2024-06-14: 10 mL via INTRADERMAL
  Filled 2024-06-14: qty 10

## 2024-06-14 NOTE — Procedures (Signed)
 PROCEDURE SUMMARY:  Successful ultrasound guided paracentesis from the left lower quadrant.  Yielded 5.5 L of clear yellow fluid.  No immediate complications.  The patient tolerated the procedure well.   Specimen not sent for labs.  EBL < 2 mL  If the patient eventually requires >/=2 paracenteses in a 30 day period, screening evaluation by the [facility] will be assessed.  Tanisia Yokley, AGACNP-BC 06/14/2024, 2:57 PM

## 2024-06-14 NOTE — Discharge Instructions (Signed)
 Reviewed with patient

## 2024-07-05 ENCOUNTER — Emergency Department

## 2024-07-05 ENCOUNTER — Other Ambulatory Visit: Payer: Self-pay

## 2024-07-05 ENCOUNTER — Encounter: Payer: Self-pay | Admitting: Oncology

## 2024-07-05 ENCOUNTER — Emergency Department
Admission: EM | Admit: 2024-07-05 | Discharge: 2024-07-06 | Disposition: A | Discharge status: Home / Self Care | Attending: Emergency Medicine | Admitting: Emergency Medicine

## 2024-07-05 DIAGNOSIS — I110 Hypertensive heart disease with heart failure: Secondary | ICD-10-CM | POA: Diagnosis not present

## 2024-07-05 DIAGNOSIS — Z85038 Personal history of other malignant neoplasm of large intestine: Secondary | ICD-10-CM | POA: Insufficient documentation

## 2024-07-05 DIAGNOSIS — J45909 Unspecified asthma, uncomplicated: Secondary | ICD-10-CM | POA: Insufficient documentation

## 2024-07-05 DIAGNOSIS — I509 Heart failure, unspecified: Secondary | ICD-10-CM | POA: Diagnosis not present

## 2024-07-05 DIAGNOSIS — K7031 Alcoholic cirrhosis of liver with ascites: Secondary | ICD-10-CM | POA: Diagnosis not present

## 2024-07-05 DIAGNOSIS — R140 Abdominal distension (gaseous): Secondary | ICD-10-CM | POA: Diagnosis present

## 2024-07-05 DIAGNOSIS — R600 Localized edema: Secondary | ICD-10-CM | POA: Insufficient documentation

## 2024-07-05 LAB — COMPREHENSIVE METABOLIC PANEL WITH GFR
ALT: 40 U/L (ref 0–44)
AST: 136 U/L — ABNORMAL HIGH (ref 15–41)
Albumin: 2.4 g/dL — ABNORMAL LOW (ref 3.5–5.0)
Alkaline Phosphatase: 208 U/L — ABNORMAL HIGH (ref 38–126)
Anion gap: 6 (ref 5–15)
BUN: 9 mg/dL (ref 8–23)
CO2: 27 mmol/L (ref 22–32)
Calcium: 8.1 mg/dL — ABNORMAL LOW (ref 8.9–10.3)
Chloride: 105 mmol/L (ref 98–111)
Creatinine, Ser: 0.59 mg/dL — ABNORMAL LOW (ref 0.61–1.24)
GFR, Estimated: >60 mL/min
Glucose, Bld: 112 mg/dL — ABNORMAL HIGH (ref 70–99)
Potassium: 3.3 mmol/L — ABNORMAL LOW (ref 3.5–5.1)
Sodium: 138 mmol/L (ref 135–145)
Total Bilirubin: 1.1 mg/dL (ref 0.0–1.2)
Total Protein: 8.3 g/dL — ABNORMAL HIGH (ref 6.5–8.1)

## 2024-07-05 LAB — CBC WITH DIFFERENTIAL/PLATELET
Abs Immature Granulocytes: 0 K/uL (ref 0.00–0.07)
Basophils Absolute: 0 K/uL (ref 0.0–0.1)
Basophils Relative: 1 %
Eosinophils Absolute: 0.1 K/uL (ref 0.0–0.5)
Eosinophils Relative: 3 %
HCT: 34.6 % — ABNORMAL LOW (ref 39.0–52.0)
Hemoglobin: 10.7 g/dL — ABNORMAL LOW (ref 13.0–17.0)
Immature Granulocytes: 0 %
Lymphocytes Relative: 36 %
Lymphs Abs: 1.1 K/uL (ref 0.7–4.0)
MCH: 27.2 pg (ref 26.0–34.0)
MCHC: 30.9 g/dL (ref 30.0–36.0)
MCV: 88 fL (ref 80.0–100.0)
Monocytes Absolute: 0.2 K/uL (ref 0.1–1.0)
Monocytes Relative: 7 %
Neutro Abs: 1.6 K/uL — ABNORMAL LOW (ref 1.7–7.7)
Neutrophils Relative %: 53 %
Platelets: 214 K/uL (ref 150–400)
RBC: 3.93 MIL/uL — ABNORMAL LOW (ref 4.22–5.81)
RDW: 14.6 % (ref 11.5–15.5)
WBC: 3 K/uL — ABNORMAL LOW (ref 4.0–10.5)
nRBC: 0 % (ref 0.0–0.2)

## 2024-07-05 LAB — PROTIME-INR
INR: 1.3 — ABNORMAL HIGH (ref 0.8–1.2)
Prothrombin Time: 16.8 s — ABNORMAL HIGH (ref 11.4–15.2)

## 2024-07-05 LAB — RESP PANEL BY RT-PCR (RSV, FLU A&B, COVID)  RVPGX2
Influenza A by PCR: NEGATIVE
Influenza B by PCR: NEGATIVE
Resp Syncytial Virus by PCR: NEGATIVE
SARS Coronavirus 2 by RT PCR: NEGATIVE

## 2024-07-05 LAB — LIPASE, BLOOD: Lipase: 52 U/L — ABNORMAL HIGH (ref 11–51)

## 2024-07-05 MED ORDER — ALBUMIN HUMAN 5 % IV SOLN
25.0000 g | Freq: Once | INTRAVENOUS | Status: AC
  Administered 2024-07-05: 25 g via INTRAVENOUS
  Filled 2024-07-05: qty 500
  Filled 2024-07-05: qty 250

## 2024-07-05 MED ORDER — LIDOCAINE HCL (PF) 1 % IJ SOLN
10.0000 mL | Freq: Once | INTRAMUSCULAR | Status: AC
  Administered 2024-07-05: 10 mL via INTRADERMAL

## 2024-07-05 NOTE — Discharge Instructions (Signed)
 You were seen today due to concern of abdominal swelling.  We have removed about 9 L of fluid from your abdomen at this time, please be sure to follow-up with your outpatient provider for continued management of symptoms.  If you have any worsening of symptoms such as increased abdominal pain, difficulty breathing, confusion, or any other symptoms you find concerning please return to the emergency department immediately for further medical management.

## 2024-07-05 NOTE — ED Provider Notes (Signed)
 "  [facility] Provider Note    Event Date/Time   First MD Initiated Contact with Patient 07/05/24 1219     (approximate)   History   Chief Complaint: Abdominal Distention and Leg Swelling   HPI  Joel Harrell is a 68 y.o. male with a history of hypertension, CHF, alcoholic cirrhosis with ascites under hospice care who comes to the ED due to increased abdominal swelling and lower leg swelling.  Last paracentesis was about a month ago.  Denies any acute shortness of breath, chest pain or fever.  No black or bloody stool.  No vomiting.  Normal oral intake.  Reviewed prior records noting issues with medication adherence.  Was planned for comfort care in conjunction with hospice at his most recent hospital discharge.  Currently at home.        Past Medical History:  Diagnosis Date   Asthma    CHF (congestive heart failure) (HCC)    Hypertension       Past Surgical History:  Procedure Laterality Date   COLONOSCOPY N/A 12/13/2023   Procedure: COLONOSCOPY;  Surgeon: Jinny Carmine, MD;  Location: [facility] ENDOSCOPY;  Service: Endoscopy;  Laterality: N/A;   ESOPHAGOGASTRODUODENOSCOPY N/A 12/12/2023   Procedure: EGD (ESOPHAGOGASTRODUODENOSCOPY);  Surgeon: Onita Elspeth Sharper, DO;  Location: [facility] ENDOSCOPY;  Service: Gastroenterology;  Laterality: N/A;   HEMOSTASIS CLIP PLACEMENT  12/13/2023   Procedure: CONTROL OF HEMORRHAGE, GI TRACT, ENDOSCOPIC, BY CLIPPING OR OVERSEWING;  Surgeon: Jinny Carmine, MD;  Location: [facility] ENDOSCOPY;  Service: Endoscopy;;   IR ANGIOGRAM PELVIS SELECTIVE OR SUPRASELECTIVE  12/13/2023   POLYPECTOMY  12/13/2023   Procedure: POLYPECTOMY, INTESTINE;  Surgeon: Jinny Carmine, MD;  Location: [facility] ENDOSCOPY;  Service: Endoscopy;;    Physical Exam   Triage Vital Signs: ED Triage Vitals  Encounter Vitals Group     BP 07/05/24 1216 (!) 127/99     Girls Systolic BP Percentile --      Girls Diastolic BP Percentile --      Boys Systolic BP Percentile --       Boys Diastolic BP Percentile --      Pulse Rate 07/05/24 1216 (!) 109     Resp 07/05/24 1216 (!) 25     Temp 07/05/24 1216 97.8 F (36.6 C)     Temp Source 07/05/24 1216 Oral     SpO2 07/05/24 1216 100 %     Weight 07/05/24 1217 224 lb 13.9 oz (102 kg)     Height 07/05/24 1217 6' 3 (1.905 m)     Head Circumference --      Peak Flow --      Pain Score 07/05/24 1217 7     Pain Loc --      Pain Education --      Exclude from Growth Chart --     Most recent vital signs: Vitals:   07/05/24 1216 07/05/24 1302  BP: (!) 127/99   Pulse: (!) 109   Resp: (!) 25   Temp: 97.8 F (36.6 C)   SpO2: 100% 100%    General: Awake, no distress.  CV:  Good peripheral perfusion.  Regular rate rhythm, heart rate 90 Resp:  Normal effort.  Clear lungs Abd:  Severely distended, nontender.  There is a small reducible umbilical hernia Other:  Moist oral mucosa.  2+ pitting edema bilateral lower extremities   ED Results / Procedures / Treatments   Labs (all labs ordered are listed, but only abnormal results are displayed) Labs Reviewed  COMPREHENSIVE METABOLIC PANEL WITH GFR - Abnormal; Notable for the following components:      Result Value   Potassium 3.3 (*)    Glucose, Bld 112 (*)    Creatinine, Ser 0.59 (*)    Calcium 8.1 (*)    Total Protein 8.3 (*)    Albumin  2.4 (*)    AST 136 (*)    Alkaline Phosphatase 208 (*)    All other components within normal limits  LIPASE, BLOOD - Abnormal; Notable for the following components:   Lipase 52 (*)    All other components within normal limits  CBC WITH DIFFERENTIAL/PLATELET - Abnormal; Notable for the following components:   WBC 3.0 (*)    RBC 3.93 (*)    Hemoglobin 10.7 (*)    HCT 34.6 (*)    Neutro Abs 1.6 (*)    All other components within normal limits  PROTIME-INR - Abnormal; Notable for the following components:   Prothrombin Time 16.8 (*)    INR 1.3 (*)    All other components within normal limits  RESP PANEL BY RT-PCR  (RSV, FLU A&B, COVID)  RVPGX2  URINALYSIS, W/ REFLEX TO CULTURE (INFECTION SUSPECTED)     EKG Interpreted by me Sinus tachycardia rate 110.  Normal axis and intervals.  Poor R wave progression.  No acute ischemic changes.   RADIOLOGY Chest x-ray interpreted by me, no pleural effusion or pulmonary edema.  Radiology report reviewed   PROCEDURES:  Procedures   MEDICATIONS ORDERED IN ED: Medications - No data to display   IMPRESSION / MDM / ASSESSMENT AND PLAN / ED COURSE  I reviewed the triage vital signs and the nursing notes.  DDx: Cirrhosis with ascites, electrolyte derangement, hypoalbuminemia, COVID, influenza, anemia  Patient's presentation is most consistent with acute presentation with potential threat to life or bodily function.  Patient presents with increased peripheral edema and abdominal distention in the setting of known cirrhosis.  He is nontoxic, reassuring exam.  Doubt SBP, pulmonary embolism, pneumonia, GI bleed.  Labs are unremarkable, essentially at baseline.  Contacted IR who will plan to do large-volume paracentesis, after which he will likely need albumin .  Home hospice team updated and will continue to follow-up with the patient.  Clinical Course as of 07/05/24 1512  Wed Jul 05, 2024  1501 Paracentesis, albumin , d/c [SK]    Clinical Course User Index [SK] Fernand Rossie HERO, MD     FINAL CLINICAL IMPRESSION(S) / ED DIAGNOSES   Final diagnoses:  Alcoholic cirrhosis of liver with ascites (HCC)     Rx / DC Orders   ED Discharge Orders     None        Note:  This document was prepared using Dragon voice recognition software and may include unintentional dictation errors.   Viviann Pastor, MD 07/05/24 1512  "

## 2024-07-05 NOTE — ED Provider Notes (Signed)
 68 year old male history of severe alcoholic cirrhosis currently on hospice presenting for concern of abdominal swelling.  At the time of signout was awaiting completion of paracentesis with anticipated likely discharge home he has had about 9 L of fluid removed, we will go ahead and give him albumin  now, and anticipate reasonable for discharge home afterwards.  Patient symptoms improved, will have him discharged home at this time after completing his albumin .   Fernand Rossie HERO, MD 07/05/24 2242

## 2024-07-05 NOTE — Procedures (Signed)
 PROCEDURE SUMMARY:  Successful image-guided paracentesis from the right lower abdomen.  Yielded 8.6 liters of clear yellow fluid.  No immediate complications.  EBL < 1 mL. Patient tolerated well.   Specimen was not sent for labs.  Please see imaging section of Epic for full dictation.  Clotilda DELENA Hesselbach PA-C 07/05/2024 3:11 PM

## 2024-07-05 NOTE — ED Notes (Signed)
 Pt back from US . Per US  8.6L of fluid pulled off.

## 2024-07-05 NOTE — ED Notes (Deleted)
 Entered in error   Viviann Pastor, MD 07/05/24 [phone]

## 2024-07-05 NOTE — ED Notes (Signed)
 Waiting on [facility] to transport home

## 2024-07-05 NOTE — Progress Notes (Signed)
 [facility] Liaison Note  This patient is currently followed by [facility].  [facility] will follow through discharge disposition.    Please call with any hospice related questions or concerns.  [facility] Liaison [phone]

## 2024-07-05 NOTE — ED Notes (Signed)
 To [place]

## 2024-07-05 NOTE — ED Triage Notes (Signed)
 Pt BIB EMS from home with complaints of bilateral lower leg swelling and abdominal distention. Pt states he had  paracentesis about a month ago. Pt has hx of colon cancer and CHF. Pt is on 2L via [place] at baseline.

## 2024-07-05 NOTE — ED Notes (Signed)
 Pt verbalized with Dr Fernand that he is willing to have the albumin  infused, but still verbalizes on blood.

## 2024-07-11 NOTE — Progress Notes (Signed)
 Joel Harrell POUR, MD sent to Joel Harrell Looks like he's a cirrhotic so no pleurX unless he's near end of life and going to hospice.  HKM       Previous Messages    ----- Message ----- From: Joel Harrell Sent: 07/10/2024   1:05 PM EST To: Ir Procedure Requests Subject: IR Pleur-X peritoneal placement                IR Approval Request:   Procedure:   IR Pleur-X peritoneal catheter placement  Reason:       recurrent ascites  History:        please see chart  Provider:     Dr Deane Finder, MD  Provider Contact #     [facility]  [phone]

## 2024-08-01 ENCOUNTER — Inpatient Hospital Stay
Admission: EM | Admit: 2024-08-01 | Discharge: 2024-08-03 | DRG: 432 | Disposition: A | Discharge status: Hospice | Attending: Internal Medicine | Admitting: Internal Medicine

## 2024-08-01 ENCOUNTER — Ambulatory Visit: Payer: Self-pay

## 2024-08-01 ENCOUNTER — Other Ambulatory Visit: Payer: Self-pay

## 2024-08-01 ENCOUNTER — Emergency Department

## 2024-08-01 ENCOUNTER — Other Ambulatory Visit: Payer: Self-pay | Admitting: Hospice and Palliative Medicine

## 2024-08-01 DIAGNOSIS — I10 Essential (primary) hypertension: Secondary | ICD-10-CM

## 2024-08-01 DIAGNOSIS — K7460 Unspecified cirrhosis of liver: Secondary | ICD-10-CM

## 2024-08-01 DIAGNOSIS — Z66 Do not resuscitate: Secondary | ICD-10-CM | POA: Diagnosis present

## 2024-08-01 DIAGNOSIS — R188 Other ascites: Secondary | ICD-10-CM

## 2024-08-01 DIAGNOSIS — I110 Hypertensive heart disease with heart failure: Secondary | ICD-10-CM | POA: Diagnosis present

## 2024-08-01 DIAGNOSIS — K7290 Hepatic failure, unspecified without coma: Secondary | ICD-10-CM

## 2024-08-01 DIAGNOSIS — I5032 Chronic diastolic (congestive) heart failure: Secondary | ICD-10-CM | POA: Diagnosis present

## 2024-08-01 DIAGNOSIS — Z85038 Personal history of other malignant neoplasm of large intestine: Secondary | ICD-10-CM

## 2024-08-01 DIAGNOSIS — K766 Portal hypertension: Secondary | ICD-10-CM | POA: Diagnosis present

## 2024-08-01 DIAGNOSIS — J449 Chronic obstructive pulmonary disease, unspecified: Secondary | ICD-10-CM | POA: Diagnosis not present

## 2024-08-01 DIAGNOSIS — K7031 Alcoholic cirrhosis of liver with ascites: Secondary | ICD-10-CM | POA: Diagnosis not present

## 2024-08-01 DIAGNOSIS — E663 Overweight: Secondary | ICD-10-CM

## 2024-08-01 DIAGNOSIS — F1090 Alcohol use, unspecified, uncomplicated: Secondary | ICD-10-CM | POA: Diagnosis not present

## 2024-08-01 DIAGNOSIS — D61818 Other pancytopenia: Secondary | ICD-10-CM | POA: Diagnosis not present

## 2024-08-01 DIAGNOSIS — D500 Iron deficiency anemia secondary to blood loss (chronic): Secondary | ICD-10-CM | POA: Diagnosis not present

## 2024-08-01 DIAGNOSIS — C189 Malignant neoplasm of colon, unspecified: Secondary | ICD-10-CM | POA: Diagnosis present

## 2024-08-01 DIAGNOSIS — F1011 Alcohol abuse, in remission: Secondary | ICD-10-CM | POA: Diagnosis present

## 2024-08-01 DIAGNOSIS — Z923 Personal history of irradiation: Secondary | ICD-10-CM

## 2024-08-01 DIAGNOSIS — Z9981 Dependence on supplemental oxygen: Secondary | ICD-10-CM

## 2024-08-01 DIAGNOSIS — I5033 Acute on chronic diastolic (congestive) heart failure: Secondary | ICD-10-CM | POA: Diagnosis present

## 2024-08-01 DIAGNOSIS — D509 Iron deficiency anemia, unspecified: Secondary | ICD-10-CM

## 2024-08-01 DIAGNOSIS — R0602 Shortness of breath: Secondary | ICD-10-CM

## 2024-08-01 DIAGNOSIS — Z79899 Other long term (current) drug therapy: Secondary | ICD-10-CM

## 2024-08-01 DIAGNOSIS — R197 Diarrhea, unspecified: Secondary | ICD-10-CM | POA: Diagnosis present

## 2024-08-01 DIAGNOSIS — J4489 Other specified chronic obstructive pulmonary disease: Secondary | ICD-10-CM | POA: Diagnosis present

## 2024-08-01 DIAGNOSIS — Z8249 Family history of ischemic heart disease and other diseases of the circulatory system: Secondary | ICD-10-CM

## 2024-08-01 DIAGNOSIS — Z515 Encounter for palliative care: Secondary | ICD-10-CM

## 2024-08-01 LAB — COMPREHENSIVE METABOLIC PANEL WITH GFR
ALT: 29 U/L (ref 0–44)
AST: 64 U/L — ABNORMAL HIGH (ref 15–41)
Albumin: 2.5 g/dL — ABNORMAL LOW (ref 3.5–5.0)
Alkaline Phosphatase: 139 U/L — ABNORMAL HIGH (ref 38–126)
Anion gap: 8 (ref 5–15)
BUN: 8 mg/dL (ref 8–23)
CO2: 27 mmol/L (ref 22–32)
Calcium: 8 mg/dL — ABNORMAL LOW (ref 8.9–10.3)
Chloride: 108 mmol/L (ref 98–111)
Creatinine, Ser: 0.55 mg/dL — ABNORMAL LOW (ref 0.61–1.24)
GFR, Estimated: >60 mL/min
Glucose, Bld: 113 mg/dL — ABNORMAL HIGH (ref 70–99)
Potassium: 3.5 mmol/L (ref 3.5–5.1)
Sodium: 142 mmol/L (ref 135–145)
Total Bilirubin: 0.7 mg/dL (ref 0.0–1.2)
Total Protein: 7.5 g/dL (ref 6.5–8.1)

## 2024-08-01 LAB — CBC
HCT: 33.4 % — ABNORMAL LOW (ref 39.0–52.0)
Hemoglobin: 10.2 g/dL — ABNORMAL LOW (ref 13.0–17.0)
MCH: 26.9 pg (ref 26.0–34.0)
MCHC: 30.5 g/dL (ref 30.0–36.0)
MCV: 88.1 fL (ref 80.0–100.0)
Platelets: 210 K/uL (ref 150–400)
RBC: 3.79 MIL/uL — ABNORMAL LOW (ref 4.22–5.81)
RDW: 15.2 % (ref 11.5–15.5)
WBC: 3.7 K/uL — ABNORMAL LOW (ref 4.0–10.5)
nRBC: 0 % (ref 0.0–0.2)

## 2024-08-01 LAB — APTT: aPTT: 32 s (ref 24–36)

## 2024-08-01 LAB — PROTIME-INR
INR: 1.3 — ABNORMAL HIGH (ref 0.8–1.2)
Prothrombin Time: 16.5 s — ABNORMAL HIGH (ref 11.4–15.2)

## 2024-08-01 LAB — AMMONIA: Ammonia: 30 umol/L (ref 9–35)

## 2024-08-01 LAB — LIPASE, BLOOD: Lipase: 69 U/L — ABNORMAL HIGH (ref 11–51)

## 2024-08-01 LAB — PRO BRAIN NATRIURETIC PEPTIDE: Pro Brain Natriuretic Peptide: 176 pg/mL

## 2024-08-01 MED ORDER — ALBUMIN HUMAN 25 % IV SOLN
25.0000 g | Freq: Once | INTRAVENOUS | Status: AC
  Administered 2024-08-02: 25 g via INTRAVENOUS
  Filled 2024-08-01: qty 100

## 2024-08-01 MED ORDER — ALBUTEROL SULFATE (2.5 MG/3ML) 0.083% IN NEBU: 2.5000 mg | INHALATION_SOLUTION | RESPIRATORY_TRACT | Status: DC | PRN

## 2024-08-01 MED ORDER — HYDRALAZINE HCL 20 MG/ML IJ SOLN: 5.0000 mg | INTRAMUSCULAR | Status: DC | PRN

## 2024-08-01 MED ORDER — LACTULOSE 10 GM/15ML PO SOLN
10.0000 g | Freq: Two times a day (BID) | ORAL | Status: DC
  Administered 2024-08-01: 10 g via ORAL
  Filled 2024-08-01 (×4): qty 30

## 2024-08-01 MED ORDER — ACETAMINOPHEN 325 MG PO TABS: 325.0000 mg | ORAL_TABLET | Freq: Four times a day (QID) | ORAL | Status: DC | PRN

## 2024-08-01 MED ORDER — FUROSEMIDE 10 MG/ML IJ SOLN
40.0000 mg | Freq: Two times a day (BID) | INTRAMUSCULAR | Status: DC
  Administered 2024-08-01 – 2024-08-03 (×4): 40 mg via INTRAVENOUS
  Filled 2024-08-01 (×4): qty 4

## 2024-08-01 MED ORDER — ONDANSETRON HCL 4 MG/2ML IJ SOLN: 4.0000 mg | Freq: Three times a day (TID) | INTRAMUSCULAR | Status: DC | PRN

## 2024-08-01 MED ORDER — OXYCODONE HCL 5 MG PO TABS
5.0000 mg | ORAL_TABLET | Freq: Four times a day (QID) | ORAL | Status: DC | PRN
  Administered 2024-08-03 (×2): 5 mg via ORAL
  Filled 2024-08-01 (×2): qty 1

## 2024-08-01 MED ORDER — DM-GUAIFENESIN ER 30-600 MG PO TB12: 1.0000 | ORAL_TABLET | Freq: Two times a day (BID) | ORAL | Status: DC | PRN

## 2024-08-01 NOTE — Progress Notes (Signed)
 [facility] Liaison Note  This patient is currently followed by [facility].  [facility] will follow through discharge disposition.  Please call with any hospice related questions or concerns.  [facility] Liasion [phone]

## 2024-08-01 NOTE — ED Notes (Signed)
 CCMD called to admit patient to monitor

## 2024-08-01 NOTE — ED Provider Notes (Signed)
 "  [facility] Provider Note    Event Date/Time   First MD Initiated Contact with Patient 08/01/24 1544     (approximate)   History   Shortness of Breath   HPI  Joel Harrell is a 69 y.o. male with a past medical history of alcoholic liver cirrhosis, esophageal varices, currently on hospice, presenting to the emergency department via EMS from home for shortness of breath.  Patient states that he is having increasing abdominal distention as well as swelling in his bilateral lower extremities.  He states that approximately a month ago he had 9 L of fluid drawn off.  He denies any fevers, sweats, chills, nausea, or vomiting.  He does have diarrhea secondary to the medications that he takes.     Physical Exam   Triage Vital Signs: ED Triage Vitals  Encounter Vitals Group     BP --      Girls Systolic BP Percentile --      Girls Diastolic BP Percentile --      Boys Systolic BP Percentile --      Boys Diastolic BP Percentile --      Pulse --      Resp --      Temp --      Temp src --      SpO2 --      Weight 08/01/24 1553 235 lb 3.7 oz (106.7 kg)     Height 08/01/24 1553 6' 3 (1.905 m)     Head Circumference --      Peak Flow --      Pain Score 08/01/24 1552 4     Pain Loc --      Pain Education --      Exclude from Growth Chart --     Most recent vital signs: There were no vitals filed for this visit.   General: Awake, no distress.  CV:  Good peripheral perfusion.  Resp:  Normal effort.  Abd:  Severe distention, nontender to palpation Other:  3+ pitting edema to bilateral lower extremities   ED Results / Procedures / Treatments   Labs (all labs ordered are listed, but only abnormal results are displayed) Labs Reviewed  CBC  COMPREHENSIVE METABOLIC PANEL WITH GFR  PROTIME-INR  APTT  LIPASE, BLOOD     EKG  ED ECG REPORT I, Reche CHRISTELLA Leventhal, the attending physician, personally viewed and interpreted this ECG.  Date:  08/01/2024 Rate: 108 bpm Rhythm: Sinus tachycardia QRS Axis: normal Intervals: normal ST/T Wave abnormalities: normal Narrative Interpretation: no evidence of acute ischemia    RADIOLOGY I, Reche CHRISTELLA Leventhal, personally viewed and evaluated these images (plain radiographs) as part of my medical decision making, as well as reviewing the written report by the radiologist.  CXR: IMPRESSION:  Hypoinflation with mild hazy bilateral perihilar and bibasilar  opacification suggesting mild interstitial edema and less likely  infection. Small left pleural effusion.      PROCEDURES:  Critical Care performed: No  Procedures   MEDICATIONS ORDERED IN ED: Medications - No data to display   IMPRESSION / MDM / ASSESSMENT AND PLAN / ED COURSE  I reviewed the triage vital signs and the nursing notes.                              Differential diagnosis includes, but is not limited to, cirrhosis with ascites, electrolyte derangement, hypoalbuminemia, CHF, pleural effusions  Patient's presentation  is most consistent with severe exacerbation of chronic illness.  Patient is a 69 year old male with a past medical history of alcoholic liver cirrhosis, esophageal varices, currently on hospice, presenting to the emergency department via EMS from home for shortness of breath.    Lab work was performed and appears to be at the patient's baseline.  I discussed the patient's case with Dr. Jennefer, interventional radiology, who reports that he can perform paracentesis tomorrow morning.  I discussed the patient's case with the hospitalist who is in agreement with admission of this patient's for symptomatic ascites shortness of breath for paracentesis by IR in the morning.      FINAL CLINICAL IMPRESSION(S) / ED DIAGNOSES   Final diagnoses:  Ascites due to alcoholic cirrhosis (HCC)  SOB (shortness of breath)     Rx / DC Orders   ED Discharge Orders     None        Note:  This  document was prepared using Dragon voice recognition software and may include unintentional dictation errors.   [place] Reche HERO, MD 08/01/24 2321  "

## 2024-08-01 NOTE — Telephone Encounter (Signed)
 FYI      Summary: Providing Report   Reason for Triage: Received call from [facility] per [facility] ph: [phone] called 911. Alice calling to give a report. Pt is in hospital in bed 1 due to shortness of breath.

## 2024-08-01 NOTE — ED Triage Notes (Signed)
 Pt to ED via [facility] from home for SOB. Pt is on hospice. Pt has CHF and Cirrhosis. EMS report pt is having increased edema in BLE and abdomen distention. Pt had 9L fluid drawn off about 6 months ago. EMS reports pt and family did have DNR but unable to find it at home.

## 2024-08-01 NOTE — H&P (Signed)
 " History and Physical    Joel Harrell FMW:[id number] DOB: 1956-02-22 DOA: 08/01/2024  Referring MD/NP/PA:   PCP: Patient, No Pcp Per   Patient coming from:  The patient is coming from home.     Chief Complaint: SOB and abdominal distention  HPI: Joel Harrell is a 69 y.o. 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  Patient was recently hospitalized from 10/7 - 10/12 due to pleural effusion and ascites secondary to decompensated alcoholic cirrhosis (end stage).  Patient had paracentesis and thoracentesis. Per discharge summary, patient was transitioned to full comfort care 10/11 after meeting with palliative and hospice, and discharged to hospice home.   Patient states that he has worsening abdominal distention, bilateral leg edema and SOB.  His SOB has been progressively worsening.  No cough or chest pain.  No fever or chills.  He has abdominal discomfort, no active abdominal pain.  He has loose stools due to lactulose  use, no nausea vomiting.  No symptoms of UTI.  Per previous discharge summary, patient was transitioned to full comfort care and is currently under hospice care, but patient still wants to have limited treatment, including paracentesis, antibiotics, albumin , diuretics and IV fluid as needed.    Data reviewed independently and ED Course: pt was found to have WBC 3.7, GFR > 60, temperature 97.9, blood pressure 131/93, heart rate 100-1 tens, RR 24, oxygen saturation 100% on home level 2 L oxygen.  Patient is admitted to telemetry bed as inpatient.  Chest x-ray: Hypoinflation with  mild hazy bilateral perihilar and bibasilar opacification suggesting mild interstitial edema and less likely infection. Small left pleural effusion.    EKG: I have personally reviewed.  Sinus rhythm, QTc 467, low voltage, PAC, nonspecific T wave change.   Review of Systems:   General: no fevers, chills, no body weight gain, has poor appetite, has fatigue HEENT: no blurry vision, hearing changes or sore throat Respiratory: no dyspnea, coughing, wheezing CV: no chest pain, no palpitations GI: no nausea, vomiting, abdominal pain, constipation.  Has abdominal distention and abdominal discomfort. GU: no dysuria, burning on urination, increased urinary frequency, hematuria  Ext: has leg edema Neuro: no unilateral weakness, numbness, or tingling, no vision change or hearing loss Skin: no rash, no skin tear. MSK: No muscle spasm, no deformity, no limitation of range of movement in spin Heme: No easy bruising.  Travel history: No recent long distant travel.   Allergy: Allergies[1]  Past Medical History:  Diagnosis Date   Asthma    CHF (congestive heart failure) (HCC)    Hypertension     Past Surgical History:  Procedure Laterality Date   COLONOSCOPY N/A 12/13/2023   Procedure: COLONOSCOPY;  Surgeon: Jinny Carmine, MD;  Location: [facility] ENDOSCOPY;  Service: Endoscopy;  Laterality: N/A;   ESOPHAGOGASTRODUODENOSCOPY N/A 12/12/2023   Procedure: EGD (ESOPHAGOGASTRODUODENOSCOPY);  Surgeon: Onita Elspeth Sharper, DO;  Location: [facility] ENDOSCOPY;  Service: Gastroenterology;  Laterality: N/A;   HEMOSTASIS CLIP PLACEMENT  12/13/2023   Procedure: CONTROL OF HEMORRHAGE, GI TRACT, ENDOSCOPIC, BY CLIPPING OR OVERSEWING;  Surgeon: Jinny Carmine, MD;  Location: [facility] ENDOSCOPY;  Service: Endoscopy;;   IR ANGIOGRAM PELVIS SELECTIVE OR SUPRASELECTIVE  12/13/2023  POLYPECTOMY  12/13/2023   Procedure: POLYPECTOMY, INTESTINE;  Surgeon: Jinny Carmine, MD;  Location: [facility] ENDOSCOPY;  Service: Endoscopy;;    Social  History:  reports that he has never smoked. He has never used smokeless tobacco. He reports current alcohol  use of about 10.0 standard drinks of alcohol  per week. He reports that he does not use drugs.  Family History:  Family History  Problem Relation Age of Onset   Heart attack Father    Cancer Mother      Prior to Admission medications  Not on File    Physical Exam: Vitals:   08/01/24 1634 08/01/24 1700 08/01/24 1800 08/01/24 2018  BP:  (!) 145/98 (!) 131/93 (!) 142/95  Pulse: (!) 106 (!) 105 (!) 106 (!) 108  Resp: 20 18 14    Temp:    97.7 F (36.5 C)  TempSrc:    Oral  SpO2:  100% 100% 100%  Weight:      Height:       General: Not in acute distress. Has anasarca HEENT:       Eyes: PERRL, EOMI, no jaundice       ENT: No discharge from the ears and nose, no pharynx injection, no tonsillar enlargement.        Neck: No JVD, no bruit, no mass felt. Heme: No neck lymph node enlargement. Cardiac: S1/S2, RRR, No murmurs, No gallops or rubs. Respiratory: No rales, wheezing, rhonchi or rubs. GI: Severely distended, nontender, no rebound pain, no organomegaly, BS present. GU: No hematuria Ext: 3+ pitting leg edema bilaterally. 1+DP/PT pulse bilaterally. Musculoskeletal: No joint deformities, No joint redness or warmth, no limitation of ROM in spin. Skin: No rashes.  Neuro: Alert, oriented X3, cranial nerves II-XII grossly intact, moves all extremities normally. Psych: Patient is not psychotic, no suicidal or hemocidal ideation.  Labs on Admission: I have personally reviewed following labs and imaging studies  CBC: Recent Labs  Lab 08/01/24 1603  WBC 3.7*  HGB 10.2*  HCT 33.4*  MCV 88.1  PLT 210   Basic Metabolic Panel: Recent Labs  Lab 08/01/24 1603  NA 142  K 3.5  CL 108  CO2 27  GLUCOSE 113*  BUN 8  CREATININE 0.55*  CALCIUM 8.0*   GFR: Estimated Creatinine Clearance: 113.5 mL/min (A) (by C-G formula based on SCr of 0.55 mg/dL (L)). Liver Function  Tests: Recent Labs  Lab 08/01/24 1603  AST 64*  ALT 29  ALKPHOS 139*  BILITOT 0.7  PROT 7.5  ALBUMIN  2.5*   Recent Labs  Lab 08/01/24 1603  LIPASE 69*   No results for input(s): AMMONIA in the last 168 hours. Coagulation Profile: Recent Labs  Lab 08/01/24 1603  INR 1.3*   Cardiac Enzymes: No results for input(s): CKTOTAL, CKMB, CKMBINDEX, TROPONINI in the last 168 hours. BNP (last 3 results) No results for input(s): PROBNP in the last 8760 hours. HbA1C: No results for input(s): HGBA1C in the last 72 hours. CBG: No results for input(s): GLUCAP in the last 168 hours. Lipid Profile: No results for input(s): CHOL, HDL, LDLCALC, TRIG, CHOLHDL, LDLDIRECT in the last 72 hours. Thyroid Function Tests: No results for input(s): TSH, T4TOTAL, FREET4, T3FREE, THYROIDAB in the last 72 hours. Anemia Panel: No results for input(s): VITAMINB12, FOLATE, FERRITIN, TIBC, IRON , RETICCTPCT in the last 72 hours. Urine analysis:    Component Value Date/Time   COLORURINE AMBER (A) 12/13/2023 1616   APPEARANCEUR CLEAR (A) 12/13/2023 1616   LABSPEC 1.019 12/13/2023 1616   PHURINE  5.0 12/13/2023 1616   GLUCOSEU NEGATIVE 12/13/2023 1616   HGBUR MODERATE (A) 12/13/2023 1616   BILIRUBINUR NEGATIVE 12/13/2023 1616   KETONESUR NEGATIVE 12/13/2023 1616   PROTEINUR 30 (A) 12/13/2023 1616   NITRITE NEGATIVE 12/13/2023 1616   LEUKOCYTESUR NEGATIVE 12/13/2023 1616   Sepsis Labs: @LABRCNTIP (procalcitonin:4,lacticidven:4) )No results found for this or any previous visit (from the past 240 hours).   Radiological Exams on Admission:   Assessment/Plan Principal Problem:   Ascites due to alcoholic cirrhosis (HCC) Active Problems:   Decompensated hepatic cirrhosis (HCC)   Chronic diastolic CHF (congestive heart failure) (HCC)   Iron  deficiency anemia due to chronic blood loss   Hypertension   COPD (chronic obstructive pulmonary disease) (HCC)    Pancytopenia (HCC)   Colon cancer (HCC)   Alcohol  use disorder   Hospice care   Assessment and Plan:  Ascites due to alcoholic cirrhosis and decompensated hepatic cirrhosis [facility]): Patient has severely distended abdomen due to worsening ascites.  No abdominal pain. Pt is currently under hospice care, but he still wants to have limited treatment, including paracentesis, antibiotics, albumin , diuretics and IV fluid as needed.  His mental status is normal.  -will admit to tele bed as inpt. - Ordered paracentesis to be done by IR in the morning. Pt wishes to keep paracentesis catheter in place so hospice team can drain the fluid for him. - Give 25 g albumin  in the morning -check ammonia level - Continue home lactulose  10 g twice daily  Chronic diastolic CHF (congestive heart failure): 2D echo on 04/20/2024 showed EF of 60-85% with grade 1 diastolic dysfunction.  Patient has anasarca with SOB - Start Lasix  20 mg twice daily -check BNP  Iron  deficiency anemia due to chronic blood loss: Hemoglobin stable 10.2 (10.7 on 07/01/2024). -Iron  supplement was discontinued at the recent discharge due to comfort care  Hypertension: Not taking medications currently. -IV hydralazine  as needed in hospital  COPD (chronic obstructive pulmonary disease) (HCC) -Bronchodilators as needed Mucinex   Pancytopenia (HCC): WBC 3.7, hemoglobin stable as above, platelet 120. -Follow-up with CBC  Colon cancer [facility]): S/p of palliative radiation therapy, not a candidate for surgery and chemotherapy. -Continue home as needed oxycodone   Alcohol  use disorder -In remission for more than 2 years  Hospice care: - Patient is under hospice care currently       DVT ppx: SCD  Code Status: DNR per pt  Family Communication:     not done, no family member is at bed side.      Disposition Plan:  Anticipate discharge back to previous environment  Consults called:  none  Admission status and Level of care:  Telemetry:  as inpt        Dispo: The patient is from: Home              Anticipated d/c is to: Home              Anticipated d/c date is: 2 days              Patient currently is not medically stable to d/c.    Severity of Illness:  The appropriate patient status for this patient is INPATIENT. Inpatient status is judged to be reasonable and necessary in order to provide the required intensity of service to ensure the patient's safety. The patient's presenting symptoms, physical exam findings, and initial radiographic and laboratory data in the context of their chronic comorbidities is felt to place them at high risk for further clinical  deterioration. Furthermore, it is not anticipated that the patient will be medically stable for discharge from the hospital within 2 midnights of admission.   * I certify that at the point of admission it is my clinical judgment that the patient will require inpatient hospital care spanning beyond 2 midnights from the point of admission due to high intensity of service, high risk for further deterioration and high frequency of surveillance required.*       Date of Service 08/01/2024    Caleb Exon [facility]   If 7PM-7AM, please contact night-coverage [email or web address] 08/01/2024, 9:08 PM     [1] No Known Allergies  "

## 2024-08-02 ENCOUNTER — Inpatient Hospital Stay

## 2024-08-02 DIAGNOSIS — K7031 Alcoholic cirrhosis of liver with ascites: Secondary | ICD-10-CM | POA: Diagnosis not present

## 2024-08-02 LAB — COMPREHENSIVE METABOLIC PANEL WITH GFR
ALT: 26 U/L (ref 0–44)
AST: 59 U/L — ABNORMAL HIGH (ref 15–41)
Albumin: 2.5 g/dL — ABNORMAL LOW (ref 3.5–5.0)
Alkaline Phosphatase: 126 U/L (ref 38–126)
Anion gap: 6 (ref 5–15)
BUN: 8 mg/dL (ref 8–23)
CO2: 30 mmol/L (ref 22–32)
Calcium: 8 mg/dL — ABNORMAL LOW (ref 8.9–10.3)
Chloride: 106 mmol/L (ref 98–111)
Creatinine, Ser: 0.53 mg/dL — ABNORMAL LOW (ref 0.61–1.24)
GFR, Estimated: >60 mL/min
Glucose, Bld: 87 mg/dL (ref 70–99)
Potassium: 3.4 mmol/L — ABNORMAL LOW (ref 3.5–5.1)
Sodium: 142 mmol/L (ref 135–145)
Total Bilirubin: 0.6 mg/dL (ref 0.0–1.2)
Total Protein: 6.9 g/dL (ref 6.5–8.1)

## 2024-08-02 LAB — CBC
HCT: 29.5 % — ABNORMAL LOW (ref 39.0–52.0)
Hemoglobin: 9.2 g/dL — ABNORMAL LOW (ref 13.0–17.0)
MCH: 27.4 pg (ref 26.0–34.0)
MCHC: 31.2 g/dL (ref 30.0–36.0)
MCV: 87.8 fL (ref 80.0–100.0)
Platelets: 186 K/uL (ref 150–400)
RBC: 3.36 MIL/uL — ABNORMAL LOW (ref 4.22–5.81)
RDW: 15.1 % (ref 11.5–15.5)
WBC: 3.3 K/uL — ABNORMAL LOW (ref 4.0–10.5)
nRBC: 0 % (ref 0.0–0.2)

## 2024-08-02 LAB — MAGNESIUM: Magnesium: 1.5 mg/dL — ABNORMAL LOW (ref 1.7–2.4)

## 2024-08-02 MED ORDER — GUAIFENESIN-DM 100-10 MG/5ML PO SYRP
5.0000 mL | ORAL_SOLUTION | ORAL | Status: DC | PRN
  Administered 2024-08-02: 5 mL via ORAL
  Filled 2024-08-02: qty 10

## 2024-08-02 MED ORDER — LIDOCAINE HCL (PF) 1 % IJ SOLN
5.0000 mL | Freq: Once | INTRAMUSCULAR | Status: AC
  Administered 2024-08-02: 5 mL via INTRADERMAL

## 2024-08-02 NOTE — Progress Notes (Signed)
 [facility] rm 209 [facility] patient visit  [name] is a current [facility] patient with a terminal diagnosis of alcoholic cirrhosis of the liver with ascites. Patient has been experiencing worsening swelling to abdomen and lower extremities for some time and thought he had been awaiting drain placement with interventional radiology through the hospital. Pain and swelling got so bad that the patient presented to the ED for acute management. [facility] was aware of this transfer. Per Dr. Cassandria Serve with [facility] this is a related hospital admission. Patient is a DNR.  Exchanged report with physician and consulting practitioner, current plan is to work towards placement of some sort of drain he can leave the hospital with pending approval. Visited with patient at bedside, he is alert and oriented and conversive, he presents with extremely distended abdomen. We discussed that the practitioner is working to see how best they can assist him and hopefully we will know something soon. His plan will be to return home once his symptoms are relieved.   He is inpatient appropriate with need for intervention for accumulating ascites.   Vital Signs- 97.8/106/16    134/94    spO2 100% on 2L I&O- Not yet recorded Abnormal Labs- K+ 3.4, Creatinine 0.53, Ca+8, Magnesium  1.5, Albumun 2.5, Alk Phos 139, WBC 3.7, RBC 3.36, Hgb 9.2, Hct 29.5 Diagnostics-   PORTABLE CHEST 1 VIEW  IMPRESSION: Hypoinflation with mild hazy bilateral perihilar and bibasilar opacification suggesting mild interstitial edema and less likely infection. Small left pleural effusion.  Electronically Signed   By: Toribio Agreste M.D.   On: 08/01/2024 16:40 IV/PRN Meds- Lasix  40mg  BID IV, Albumin  25g IV Once Current plan as per H&P Dr. Xilin Niu 1.20.26 Ascites due to alcoholic cirrhosis and decompensated hepatic cirrhosis [facility]): Patient has severely distended abdomen due to worsening ascites.  No abdominal  pain. Pt is currently under hospice care, but he still wants to have limited treatment, including paracentesis, antibiotics, albumin , diuretics and IV fluid as needed.  His mental status is normal.   -will admit to tele bed as inpt. - Ordered paracentesis to be done by IR in the morning. Pt wishes to keep paracentesis catheter in place so hospice team can drain the fluid for him. - Give 25 g albumin  in the morning -check ammonia level - Continue home lactulose  10 g twice daily   Chronic diastolic CHF (congestive heart failure): 2D echo on 04/20/2024 showed EF of 60-85% with grade 1 diastolic dysfunction.  Patient has anasarca with SOB - Start Lasix  20 mg twice daily -check BNP   Iron  deficiency anemia due to chronic blood loss: Hemoglobin stable 10.2 (10.7 on 07/01/2024). -Iron  supplement was discontinued at the recent discharge due to comfort care   Hypertension: Not taking medications currently. -IV hydralazine  as needed in hospital   COPD (chronic obstructive pulmonary disease) (HCC) -Bronchodilators as needed Mucinex    Pancytopenia (HCC): WBC 3.7, hemoglobin stable as above, platelet 120. -Follow-up with CBC   Colon cancer [facility]): S/p of palliative radiation therapy, not a candidate for surgery and chemotherapy. -Continue home as needed oxycodone    Alcohol  use disorder -In remission for more than 2 years   Hospice care: - Patient is under hospice care currently    Discharge Planning- Ongoing, likely once medically optimized Family Contact- None patient is his own spokesperson IDT: updated Goals of Care: DNR Transfer summary and Med list sent to be placed under Media  Thank you for the opportunity to participate in this patient's  care, please don't hesitate to call for any hospice related questions or concerns.  Hunter Seip BSN, [facility] Liaison [phone]

## 2024-08-02 NOTE — Care Management Important Message (Signed)
 Important Message  Patient Details  Name: Joel Harrell MRN: [id number] Date of Birth: 1955/09/12   Important Message Given:  Yes - Medicare IM     Kinte Trim W, CMA 08/02/2024, 12:38 PM

## 2024-08-02 NOTE — Progress Notes (Signed)
 Brief Interventional Radiology Note:  IR consulted for possible peritoneal PleurX placement. Patient is known to IR from recurrent paracentesis due to recurrent ascites secondary to decompensated cirrhosis. Case has been previously reviewed for possible PleurX drain which was ultimately not recommended. Following discussion with IR attending, patient, and team today, IR is considering patient for possible TIPS procedure. GI consult placed for additional input. Will await GI note for recommendations.  IR will continue to follow patient for either TIPS or possible peritoneal pleurX drain.  Please reach out to our service with any additional questions or concerns.   Electronically Signed: Kanav Kazmierczak M Riely Oetken, PA-C 08/02/2024, 4:54 PM

## 2024-08-02 NOTE — Progress Notes (Signed)
 " PROGRESS NOTE    Joel Harrell  FMW:[id number] DOB: 20-Aug-1955 DOA: 08/01/2024 PCP: Patient, No Pcp Per    Brief Narrative:   69 y.o. 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   Patient was recently hospitalized from 10/7 - 10/12 due to pleural effusion and ascites secondary to decompensated alcoholic cirrhosis (end stage).  Patient had paracentesis and thoracentesis. Per discharge summary, patient was transitioned to full comfort care 10/11 after meeting with palliative and hospice, and discharged to hospice home.    Patient states that he has worsening abdominal distention, bilateral leg edema and SOB.  His SOB has been progressively worsening.  No cough or chest pain.  No fever or chills.  He has abdominal discomfort, no active abdominal pain.  He has loose stools due to lactulose  use, no nausea vomiting.  No symptoms of UTI.   Per previous discharge summary, patient was transitioned to full comfort care and is currently under hospice care, but patient still wants to have limited treatment, including paracentesis, antibiotics, albumin , diuretics and IV fluid as needed.   Assessment & Plan:   Principal Problem:   Ascites due to alcoholic cirrhosis (HCC) Active Problems:   Decompensated hepatic cirrhosis (HCC)   Chronic diastolic CHF (congestive heart failure) (HCC)   Iron  deficiency anemia due to chronic blood loss   Hypertension   COPD (chronic obstructive pulmonary disease) (HCC)   Pancytopenia (HCC)   Colon cancer (HCC)   Alcohol  use disorder   Hospice care  Ascites due to  alcoholic cirrhosis and decompensated hepatic cirrhosis [facility]): Patient has severely distended abdomen due to worsening ascites.  No abdominal pain. Pt is currently under hospice care, but he still wants to have limited treatment, including paracentesis, antibiotics, albumin , diuretics and IV fluid as needed.  His mental status is normal. Plan: Discussed with IR and hospice liaison team.  Likely the patient would benefit from placement of an indwelling peritoneal drain rather than paracentesis given symptoms and rapid reaccumulation of fluid.  Deferred to recommendations and evaluation by interventional radiology.   Chronic diastolic CHF (congestive heart failure): 2D echo on 04/20/2024 showed EF of 60-85% with grade 1 diastolic dysfunction.  Patient has anasarca with SOB - Continue Lasix  20 mg twice daily for now   Iron  deficiency anemia due to chronic blood loss: Hemoglobin stable 10.2 (10.7 on 07/01/2024). -Iron  supplement was discontinued at the recent discharge due to comfort care   Hypertension: Not taking medications currently. -IV hydralazine  as needed in hospital   COPD (chronic obstructive pulmonary disease) (HCC) -Bronchodilators as needed Mucinex    Pancytopenia (HCC): WBC 3.7, hemoglobin stable as above, platelet 120. -Follow-up with CBC   Colon cancer [facility]): S/p of palliative radiation therapy, not a candidate for surgery and chemotherapy. -Continue home as needed oxycodone    Alcohol  use disorder -In remission for more than 2 years   Hospice care: - Patient is under hospice care currently -Hospice liaison engaged     DVT prophylaxis: SCD Code Status: DNR Family Communication: None today Disposition Plan: Status is: Inpatient Remains inpatient appropriate because: Decompensated cirrhosis, symptomatic ascites   Level of care: Telemetry  Consultants:  IR  Procedures:  None  Antimicrobials: None   Subjective: Seen and examined.  Resting in bed.  Reports some  abdominal discomfort otherwise stable  Objective: Vitals:   08/01/24 1800 08/01/24 2018 08/02/24 0318 08/02/24 0754  BP: (!) 131/93 (!) 142/95 (!) 125/92 (!) 134/94  Pulse: (!) 106 (!) 108 (!) 110 (!) 106  Resp: 14 20 20 16   Temp:  97.7 F (36.5 C) 98 F (36.7 C) 97.8 F (36.6 C)  TempSrc:  Oral Oral   SpO2: 100% 100% 100% 100%  Weight:  104 kg 103 kg   Height:  6' 3 (1.905 m)     No intake or output data in the 24 hours ending 08/02/24 1213 Filed Weights   08/01/24 1553 08/01/24 2018 08/02/24 0318  Weight: 106.7 kg 104 kg 103 kg    Examination:  General exam: Appears fatigued and chronically ill Respiratory system: Clear to auscultation. Respiratory effort normal. Cardiovascular system: S1-S2, RRR, no murmurs, plus pitting edema BLE Gastrointestinal system: Marked distention, tender to palpation, decreased bowel sounds Central nervous system: Alert and oriented. No focal neurological deficits. Extremities: Symmetric 5 x 5 power. Skin: No rashes, lesions or ulcers Psychiatry: Judgement and insight appear normal. Mood & affect appropriate.     Data Reviewed: I have personally reviewed following labs and imaging studies  CBC: Recent Labs  Lab 08/01/24 1603 08/02/24 0605  WBC 3.7* 3.3*  HGB 10.2* 9.2*  HCT 33.4* 29.5*  MCV 88.1 87.8  PLT 210 186   Basic Metabolic Panel: Recent Labs  Lab 08/01/24 1603 08/02/24 0605  NA 142 142  K 3.5 3.4*  CL 108 106  CO2 27 30  GLUCOSE 113* 87  BUN 8 8  CREATININE 0.55* 0.53*  CALCIUM 8.0* 8.0*  MG  --  1.5*   GFR: Estimated Creatinine Clearance: 111.7 mL/min (A) (by C-G formula based on SCr of 0.53 mg/dL (L)). Liver Function Tests: Recent Labs  Lab 08/01/24 1603 08/02/24 0605  AST 64* 59*  ALT 29 26  ALKPHOS 139* 126  BILITOT 0.7 0.6  PROT 7.5 6.9  ALBUMIN  2.5* 2.5*   Recent Labs  Lab 08/01/24 1603  LIPASE 69*   Recent Labs  Lab 08/01/24 2055  AMMONIA 30   Coagulation Profile: Recent Labs  Lab  08/01/24 1603  INR 1.3*   Cardiac Enzymes: No results for input(s): CKTOTAL, CKMB, CKMBINDEX, TROPONINI in the last 168 hours. BNP (last 3 results) Recent Labs    08/01/24 2055  PROBNP 176.0   HbA1C: No results for input(s): HGBA1C in the last 72 hours. CBG: No results for input(s): GLUCAP in the last 168 hours. Lipid Profile: No results for input(s): CHOL, HDL, LDLCALC, TRIG, CHOLHDL, LDLDIRECT in the last 72 hours. Thyroid Function Tests: No results for input(s): TSH, T4TOTAL, FREET4, T3FREE, THYROIDAB in the last 72 hours. Anemia Panel: No results for input(s): VITAMINB12, FOLATE, FERRITIN, TIBC, IRON , RETICCTPCT in the last 72 hours. Sepsis Labs: No results for input(s): PROCALCITON, LATICACIDVEN in the last 168 hours.  No results found for this or any previous visit (from the past 240 hours).       Radiology Studies: DG Chest Portable 1 View Result Date: 08/01/2024 CLINICAL DATA:  Shortness of breath with bilateral lower extremity edema. EXAM: PORTABLE CHEST 1 VIEW COMPARISON:  07/05/2024 FINDINGS: Lungs are hypoinflated with mild hazy bilateral perihilar and bibasilar opacification suggesting mild interstitial edema and less likely infection. Small left pleural effusion. Cardiomediastinal silhouette and remainder of the exam is unchanged. IMPRESSION: Hypoinflation with  mild hazy bilateral perihilar and bibasilar opacification suggesting mild interstitial edema and less likely infection. Small left pleural effusion. Electronically Signed   By: Toribio Agreste M.D.   On: 08/01/2024 16:40        Scheduled Meds:  furosemide   40 mg Intravenous Q12H   lactulose   10 g Oral BID   Continuous Infusions:   LOS: 1 day      Calvin KATHEE Robson, MD [facility]   If 7PM-7AM, please contact night-coverage  08/02/2024, 12:13 PM   "

## 2024-08-03 DIAGNOSIS — K7031 Alcoholic cirrhosis of liver with ascites: Principal | ICD-10-CM

## 2024-08-03 DIAGNOSIS — F1090 Alcohol use, unspecified, uncomplicated: Secondary | ICD-10-CM | POA: Diagnosis not present

## 2024-08-03 LAB — MISC LABCORP TEST (SEND OUT)
Labcorp test code: 100156
Labcorp test code: 19497
Labcorp test code: 19588

## 2024-08-03 NOTE — Plan of Care (Signed)

## 2024-08-03 NOTE — Discharge Summary (Signed)
 Physician Discharge Summary  Joel Harrell FMW:[id number] DOB: [birth year] DOA: 08/01/2024  PCP: Patient, No Pcp Per  Admit date: 08/01/2024 Discharge date: 08/03/2024  Admitted From: Home hospice Disposition:  Home hospice  Recommendations for Outpatient Follow-up:  Per hospice physician   Home Health:No  Equipment/Devices:None   Discharge Condition: Hospice  CODE STATUS:DNR  Diet recommendation: Regular  Brief/Interim Summary:   69 y.o. 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   Patient was recently hospitalized from 10/7 - 10/12 due to pleural effusion and ascites secondary to decompensated alcoholic cirrhosis (end stage).  Patient had paracentesis and thoracentesis. Per discharge summary, patient was transitioned to full comfort care 10/11 after meeting with palliative and hospice, and discharged to hospice home.    Patient states that he has worsening abdominal distention, bilateral leg edema and SOB.  His SOB has been progressively worsening.  No cough or chest pain.  No fever or chills.  He has abdominal discomfort, no active abdominal pain.  He has loose stools due to lactulose  use, no nausea vomiting.  No symptoms of UTI.   Per previous discharge summary, patient was transitioned to full comfort care and is currently under hospice care, but patient still wants to have limited treatment, including paracentesis, antibiotics, albumin , diuretics and IV fluid as needed.  GI was consulted for recommendations.  After conversation with GI patient elected to decline TIPS procedure at this  time given the risk of hepatic encephalopathy.  IR made aware.  Given that patient has just underwent high-volume paracentesis at this current juncture he is not an appropriate candidate for a peritoneal indwelling drain.  Hospice representatives to be contacted to resubmit procedure request as outpatient.    Discharge Diagnoses:  Principal Problem:   Ascites due to alcoholic cirrhosis (HCC) Active Problems:   Decompensated hepatic cirrhosis (HCC)   Chronic diastolic CHF (congestive heart failure) (HCC)   Iron  deficiency anemia due to chronic blood loss   Hypertension   COPD (chronic obstructive pulmonary disease) (HCC)   Pancytopenia (HCC)   Colon cancer (HCC)   Alcohol  use disorder   Hospice care  Ascites due to alcoholic cirrhosis and decompensated hepatic cirrhosis [facility]): Patient has severely distended abdomen due to worsening ascites.  No abdominal pain. Pt is currently under hospice care, but he still wants to have limited treatment, including paracentesis, antibiotics, albumin , diuretics and IV fluid as needed.  His mental status is normal. Plan: Discussed with IR and hospice liaison team.  Patient underwent a high-volume paracentesis, 7.7 L fluid removed from abdomen on 1/21.  Given the recent high-volume paracentesis he is not a candidate for a indwelling drain placement at this time.  Hospice team to resubmit authorization for procedure request as outpatient.  GI discussed the possibility of a TIPS procedure with the patient.  Although he may be an appropriate candidate the patient is declining TIPS procedure at this time given the increased risk of hepatic encephalopathy.  This may need to be reevaluated as outpatient.     Discharge Instructions  Discharge Instructions     Increase activity slowly   Complete by: As directed       Allergies as of  08/03/2024   No Known Allergies      Medication List     TAKE these medications    guaiFENesin  100 MG/5ML  liquid Commonly known as: ROBITUSSIN Take 10 mLs by mouth every 4 (four) hours as needed.   lactulose  10 GM/15ML solution Commonly known as: CHRONULAC  Take 10 g by mouth 2 (two) times daily.   oxyCODONE  5 MG immediate release tablet Commonly known as: Oxy IR/ROXICODONE  Take 5 mg by mouth every 6 (six) hours as needed.   Sarna Sensitive 1 % Lotn Generic drug: pramoxine Apply 1 Application topically 4 (four) times daily as needed.        Allergies[1]  Consultations: GI IR   Procedures/Studies: US  Paracentesis Result Date: 08/02/2024 INDICATION: 69 year old male with a history of decompensated cirrhosis secondary to alcohol  use, colon cancer, recurrent ascites. Request for diagnostic and therapeutic paracentesis. EXAM: ULTRASOUND GUIDED DIAGNOSTIC AND THERAPEUTIC, RIGHT-SIDED PARACENTESIS MEDICATIONS: 1% lidocaine , 6 mL. COMPLICATIONS: None immediate. PROCEDURE: Informed written consent was obtained from the patient after a discussion of the risks, benefits and alternatives to treatment. A timeout was performed prior to the initiation of the procedure. Initial ultrasound scanning demonstrates a large amount of ascites within the right lower abdominal quadrant. The right lower abdomen was prepped and draped in the usual sterile fashion. 1% lidocaine  was used for local anesthesia. Following this, a 19 gauge, 7-cm, Yueh catheter was introduced. An ultrasound image was saved for documentation purposes. The paracentesis was performed. The catheter was removed and a dressing was applied. The patient tolerated the procedure well without immediate post procedural complication. Patient received post-procedure intravenous albumin ; see nursing notes for details. FINDINGS: A total of approximately 7.7 L of clear yellow fluid was removed. Samples were sent to the laboratory as requested by the clinical team. IMPRESSION: Successful ultrasound-guided paracentesis yielding 7.7 liters of peritoneal fluid.  PLAN: The patient has required >/=2 paracenteses in a 30 day period and a formal evaluation by the [facility] Interventional Radiology Portal Hypertension Clinic has been arranged. Procedure performed by: Sherrilee Bal, PA-C under the supervision of Dr. ONEIDA Specking Electronically Signed   By: CHRISTELLA.  Shick M.D.   On: 08/02/2024 16:20   DG Chest Portable 1 View Result Date: 08/01/2024 CLINICAL DATA:  Shortness of breath with bilateral lower extremity edema. EXAM: PORTABLE CHEST 1 VIEW COMPARISON:  07/05/2024 FINDINGS: Lungs are hypoinflated with mild hazy bilateral perihilar and bibasilar opacification suggesting mild interstitial edema and less likely infection. Small left pleural effusion. Cardiomediastinal silhouette and remainder of the exam is unchanged. IMPRESSION: Hypoinflation with mild hazy bilateral perihilar and bibasilar opacification suggesting mild interstitial edema and less likely infection. Small left pleural effusion. Electronically Signed   By: Toribio Agreste M.D.   On: 08/01/2024 16:40   US  Paracentesis Result Date: 07/07/2024 INDICATION: Patient with history of cirrhosis, colon cancer, recurrent ascites. Request for therapeutic paracentesis. EXAM: ULTRASOUND GUIDED THERAPEUTIC PARACENTESIS MEDICATIONS: 4 mL 1% lidocaine  COMPLICATIONS: None immediate. PROCEDURE: Informed written consent was obtained from the patient after a discussion of the risks, benefits and alternatives to treatment. A timeout was performed prior to the initiation of the procedure. Initial ultrasound scanning demonstrates a large amount of ascites within the right lower abdominal quadrant. The right lower abdomen was prepped and draped in the usual sterile fashion. 1% lidocaine  was used for local anesthesia. Following this, a 19 gauge, 7-cm, Yueh catheter was introduced. An ultrasound image was saved for documentation purposes. The paracentesis was performed. The catheter was removed and a dressing  was applied. The patient  tolerated the procedure well without immediate post procedural complication. Patient received post-procedure intravenous albumin ; see nursing notes for details. FINDINGS: A total of approximately 8.6 L of clear yellow fluid was removed. IMPRESSION: Successful ultrasound-guided paracentesis yielding 8.6 liters of peritoneal fluid. Performed by Clotilda Hesselbach, PA-C Electronically Signed   By: JONETTA Faes M.D.   On: 07/07/2024 07:15   DG Chest Portable 1 View Result Date: 07/05/2024 EXAM: 1 VIEW XRAY OF THE CHEST 07/05/2024 01:15:00 PM COMPARISON: 04/20/2024 CLINICAL HISTORY: weakness, leg swelling FINDINGS: LUNGS AND PLEURA: Low lung volumes, limiting evaluation. Pulmonary vascular congestion. Patchy airspace opacities in left lung base. No pleural effusion. No pneumothorax. HEART AND MEDIASTINUM: No acute abnormality of the cardiac and mediastinal silhouettes. BONES AND SOFT TISSUES: No acute osseous abnormality. IMPRESSION: 1. Low lung volumes, limiting evaluation. 2. Patchy left basilar airspace opacity, possibly representing atelectasis and/or infection . Electronically signed by: Michaeline Blanch MD 07/05/2024 01:20 PM EST RP Workstation: [id number]      Subjective: Seen and examined on the day of discharge.  Stable no distress.  Appropriate for return home hospice services.  Discharge Exam: Vitals:   08/03/24 0405 08/03/24 0748  BP: 115/70 124/75  Pulse: (!) 110 (!) 107  Resp:  16  Temp: 98 F (36.7 C) 98 F (36.7 C)  SpO2: 100% 99%   Vitals:   08/02/24 1707 08/02/24 2027 08/03/24 0405 08/03/24 0748  BP: 120/73 122/81 115/70 124/75  Pulse: (!) 108 (!) 108 (!) 110 (!) 107  Resp: 16 17  16   Temp: 97.8 F (36.6 C) 98.8 F (37.1 C) 98 F (36.7 C) 98 F (36.7 C)  TempSrc:    Oral  SpO2: 100% 100% 100% 99%  Weight:   95.2 kg   Height:        General: Pt is alert, awake, not in acute distress Cardiovascular: RRR, S1/S2 +, no rubs, no gallops Respiratory: CTA bilaterally, no  wheezing, no rhonchi Abdominal: Soft, NT, ND, bowel sounds + Extremities: no edema, no cyanosis    The results of significant diagnostics from this hospitalization (including imaging, microbiology, ancillary and laboratory) are listed below for reference.     Microbiology: Recent Results (from the past 240 hours)  Body fluid culture w Gram Stain     Status: None (Preliminary result)   Collection Time: 08/02/24  3:20 PM   Specimen: PATH Cytology Peritoneal fluid  Result Value Ref Range Status   Specimen Description   Final    PERITONEAL Performed at [facility], [address], [place], [place] [address]    Special Requests   Final    PATH CYTO PERI Performed at [facility], [address] [place], [place] [address]    Gram Stain NO WBC SEEN NO ORGANISMS SEEN   Final   Culture   Final    NO GROWTH < 12 HOURS Performed at [facility], [address] [place], [place] [address]    Report Status PENDING  Incomplete     Labs: BNP (last 3 results) Recent Labs    12/10/23 2213 12/10/23 2321 04/18/24 1653  BNP 161.2* 135.8* 154.9*   Basic Metabolic Panel: Recent Labs  Lab 08/01/24 1603 08/02/24 0605  NA 142 142  K 3.5 3.4*  CL 108 106  CO2 27 30  GLUCOSE 113* 87  BUN 8 8  CREATININE 0.55* 0.53*  CALCIUM 8.0* 8.0*  MG  --  1.5*   Liver Function Tests: Recent  Labs  Lab 08/01/24 1603 08/02/24 0605  AST 64* 59*  ALT 29 26  ALKPHOS 139* 126  BILITOT 0.7 0.6  PROT 7.5 6.9  ALBUMIN  2.5* 2.5*   Recent Labs  Lab 08/01/24 1603  LIPASE 69*   Recent Labs  Lab 08/01/24 2055  AMMONIA 30   CBC: Recent Labs  Lab 08/01/24 1603 08/02/24 0605  WBC 3.7* 3.3*  HGB 10.2* 9.2*  HCT 33.4* 29.5*  MCV 88.1 87.8  PLT 210 186   Cardiac Enzymes: No results for input(s): CKTOTAL, CKMB, CKMBINDEX, TROPONINI in the last 168 hours. BNP: Invalid input(s): POCBNP CBG: No results for input(s): GLUCAP in the last 168  hours. D-Dimer No results for input(s): DDIMER in the last 72 hours. Hgb A1c No results for input(s): HGBA1C in the last 72 hours. Lipid Profile No results for input(s): CHOL, HDL, LDLCALC, TRIG, CHOLHDL, LDLDIRECT in the last 72 hours. Thyroid function studies No results for input(s): TSH, T4TOTAL, T3FREE, THYROIDAB in the last 72 hours.  Invalid input(s): FREET3 Anemia work up No results for input(s): VITAMINB12, FOLATE, FERRITIN, TIBC, IRON , RETICCTPCT in the last 72 hours. Urinalysis    Component Value Date/Time   COLORURINE AMBER (A) 12/13/2023 1616   APPEARANCEUR CLEAR (A) 12/13/2023 1616   LABSPEC 1.019 12/13/2023 1616   PHURINE 5.0 12/13/2023 1616   GLUCOSEU NEGATIVE 12/13/2023 1616   HGBUR MODERATE (A) 12/13/2023 1616   BILIRUBINUR NEGATIVE 12/13/2023 1616   KETONESUR NEGATIVE 12/13/2023 1616   PROTEINUR 30 (A) 12/13/2023 1616   NITRITE NEGATIVE 12/13/2023 1616   LEUKOCYTESUR NEGATIVE 12/13/2023 1616   Sepsis Labs Recent Labs  Lab 08/01/24 1603 08/02/24 0605  WBC 3.7* 3.3*   Microbiology Recent Results (from the past 240 hours)  Body fluid culture w Gram Stain     Status: None (Preliminary result)   Collection Time: 08/02/24  3:20 PM   Specimen: PATH Cytology Peritoneal fluid  Result Value Ref Range Status   Specimen Description   Final    PERITONEAL Performed at [facility], [address] [place], [place] [address]    Special Requests   Final    PATH CYTO PERI Performed at [facility], [address] [place], [place] [address]    Gram Stain NO WBC SEEN NO ORGANISMS SEEN   Final   Culture   Final    NO GROWTH < 12 HOURS Performed at [facility], [address] [place], [place] [address]    Report Status PENDING  Incomplete     Time coordinating discharge: 40 minutes  SIGNED:   Calvin KATHEE Robson, MD  [facility] 08/03/2024, 2:21 PM Pager   If 7PM-7AM, please  contact night-coverage     [1] No Known Allergies

## 2024-08-03 NOTE — Progress Notes (Signed)
 [facility] rm [id number] [facility] patient visit   [name] is a current [facility] patient with a terminal diagnosis of alcoholic cirrhosis of the liver with ascites. Patient has been experiencing worsening swelling to abdomen and lower extremities for some time and thought he had been awaiting drain placement with interventional radiology through the hospital. Pain and swelling got so bad that the patient presented to the ED for acute management. [facility] was aware of this transfer. Per Dr. Cassandria Serve with [facility] this is a related hospital admission. Patient is a DNR.   Visted patient at the bedside. He was sitting up in bed with 02 in place.  Alert and oriented.  He said that he is just waiting to see what they recommend for his Ascites.     He is inpatient appropriate with need for intervention for accumulating ascites.    Vital Signs- 98/107/16    124/75    spO2 100% on 3L  I&O- 240/300  Abnormal Labs- no new lab results.    Diagnostics-   INDICATION: 69 year old male with a history of decompensated cirrhosis secondary to alcohol  use, colon cancer, recurrent ascites. Request for diagnostic and therapeutic paracentesis.   EXAM: ULTRASOUND GUIDED DIAGNOSTIC AND THERAPEUTIC, RIGHT-SIDED PARACENTESIS   MEDICATIONS: 1% lidocaine , 6 mL.   COMPLICATIONS: None immediate.   PROCEDURE: Informed written consent was obtained from the patient after a discussion of the risks, benefits and alternatives to treatment. A timeout was performed prior to the initiation of the procedure.   Initial ultrasound scanning demonstrates a large amount of ascites within the right lower abdominal quadrant. The right lower abdomen was prepped and draped in the usual sterile fashion. 1% lidocaine  was used for local anesthesia.   Following this, a 19 gauge, 7-cm, Yueh catheter was introduced. An ultrasound image was saved for documentation purposes. The paracentesis  was performed. The catheter was removed and a dressing was applied. The patient tolerated the procedure well without immediate post procedural complication. Patient received post-procedure intravenous albumin ; see nursing notes for details.   FINDINGS: A total of approximately 7.7 L of clear yellow fluid was removed. Samples were sent to the laboratory as requested by the clinical team.   IMPRESSION: Successful ultrasound-guided paracentesis yielding 7.7 liters of peritoneal fluid.   PLAN: The patient has required >/=2 paracenteses in a 30 day period and a formal evaluation by the [facility] Interventional Radiology Portal Hypertension Clinic has been arranged.   Procedure performed by: Sherrilee Bal, PA-C under the supervision of Dr. ONEIDA Specking     Electronically Signed   By: CHRISTELLA.  Shick M.D.   On: 08/02/2024 16:20  IV/PRN Meds- Lasix  40mg  BID IV, Albumin  25g IV Once  Current plan as per MD progress note Joel Harrell 1.22.26 Assessment & Plan:   Principal Problem:   Ascites due to alcoholic cirrhosis (HCC) Active Problems:   Decompensated hepatic cirrhosis (HCC)   Chronic diastolic CHF (congestive heart failure) (HCC)   Iron  deficiency anemia due to chronic blood loss   Hypertension   COPD (chronic obstructive pulmonary disease) (HCC)   Pancytopenia (HCC)   Colon cancer (HCC)   Alcohol  use disorder   Hospice care  Ascites due to alcoholic cirrhosis and decompensated hepatic cirrhosis [facility]): Patient has severely distended abdomen due to worsening ascites.  No abdominal pain. Pt is currently under hospice care, but he still wants to have limited treatment, including paracentesis, antibiotics, albumin , diuretics and IV fluid as needed.  His mental status is normal.  Plan: Discussed with IR and hospice liaison team.  Likely the patient would benefit from placement of an indwelling peritoneal drain rather than paracentesis given symptoms and rapid reaccumulation of fluid.   Interventional radiology inquired as to whether the patient will be a potential TIPS candidate.  Gastroenterology engaged for comment.  Pending their evaluation and recommendations in order to determine most appropriate course of action.       Chronic diastolic CHF (congestive heart failure): 2D echo on 04/20/2024 showed EF of 60-85% with grade 1 diastolic dysfunction.  Patient has anasarca with SOB - Continue Lasix  20 mg twice daily for now   Iron  deficiency anemia due to chronic blood loss: Hemoglobin stable 10.2 (10.7 on 07/01/2024). -Iron  supplement was discontinued at the recent discharge due to comfort care   Hypertension: Not taking medications currently. -IV hydralazine  as needed in hospital   COPD (chronic obstructive pulmonary disease) (HCC) -Bronchodilators as needed Mucinex    Pancytopenia (HCC): WBC 3.7, hemoglobin stable as above, platelet 120. -Follow-up with CBC   Colon cancer [facility]): S/p of palliative radiation therapy, not a candidate for surgery and chemotherapy. -Continue home as needed oxycodone    Alcohol  use disorder -In remission for more than 2 years   Hospice care: - Patient is under hospice care currently - Hospice liaison engaged  Discharge Planning- Ongoing, likely once medically optimized Family Contact- None patient is his own spokesperson IDT: updated Goals of Care: DNR Transfer summary and Med list sent to be placed under Media  Thank you for the opportunity to participate in this patient's care, please don't hesitate to call for any hospice related questions or concerns.   [facility] Liaison [phone]

## 2024-08-03 NOTE — Consult Note (Signed)
 "      Rogelia Copping, MD [facility]  [address], [address] [place], [place] [address] Phone: [phone] Fax : [phone]  Consultation  Referring Provider:     Dr. Majel Primary Care Physician:  Patient, No Pcp Per Primary Gastroenterologist:  Dr. Onita         Reason for Consultation:     Cirrhosis with ascites  Date of Admission:  08/01/2024 Date of Consultation:  08/03/2024         HPI:   Jawan Chavarria is a 69 y.o. male who has a history of alcoholic cirrhosis.  The patient has been placed into hospice for his cirrhosis.  The patient has needed multiple paracentesis in was being seen by interventional radiology for possible PleurX catheter insertion.  Due to the patient's MELD score being low and the complications associated with the PleurX catheter it was suggested that this patient may benefit from a TIPS procedure and a GI consult was called. The patient has had a history of GI bleeding hepatic encephalopathy COPD hypertension and colon cancer status post palliative radiation therapy and deemed not a candidate for surgery or chemotherapy.  The patient is under hospice care for the above reasons.  The patient had a high-volume paracentesis during this admission.  Past Medical History:  Diagnosis Date   Asthma    CHF (congestive heart failure) (HCC)    Hypertension     Past Surgical History:  Procedure Laterality Date   COLONOSCOPY N/A 12/13/2023   Procedure: COLONOSCOPY;  Surgeon: Copping Rogelia, MD;  Location: [facility] ENDOSCOPY;  Service: Endoscopy;  Laterality: N/A;   ESOPHAGOGASTRODUODENOSCOPY N/A 12/12/2023   Procedure: EGD (ESOPHAGOGASTRODUODENOSCOPY);  Surgeon: Onita Elspeth Sharper, DO;  Location: [facility] ENDOSCOPY;  Service: Gastroenterology;  Laterality: N/A;   HEMOSTASIS CLIP PLACEMENT  12/13/2023   Procedure: CONTROL OF HEMORRHAGE, GI TRACT, ENDOSCOPIC, BY CLIPPING OR OVERSEWING;  Surgeon: Copping Rogelia, MD;  Location: [facility] ENDOSCOPY;  Service: Endoscopy;;   IR ANGIOGRAM PELVIS  SELECTIVE OR SUPRASELECTIVE  12/13/2023   POLYPECTOMY  12/13/2023   Procedure: POLYPECTOMY, INTESTINE;  Surgeon: Copping Rogelia, MD;  Location: [facility] ENDOSCOPY;  Service: Endoscopy;;    Prior to Admission medications  Medication Sig Start Date End Date Taking? Authorizing Provider  guaiFENesin  (ROBITUSSIN) 100 MG/5ML liquid Take 10 mLs by mouth every 4 (four) hours as needed. 07/28/24  Yes [provider]  lactulose  (CHRONULAC ) 10 GM/15ML solution Take 10 g by mouth 2 (two) times daily. 06/22/24  Yes [provider]  oxyCODONE  (OXY IR/ROXICODONE ) 5 MG immediate release tablet Take 5 mg by mouth every 6 (six) hours as needed. 07/28/24  Yes [provider]  SARNA SENSITIVE 1 % LOTN Apply 1 Application topically 4 (four) times daily as needed. 06/15/24  Yes [provider]    Family History  Problem Relation Age of Onset   Heart attack Father    Cancer Mother      Social History[1]  Allergies as of 08/01/2024   (No Known Allergies)    Review of Systems:    All systems reviewed and negative except where noted in HPI.   Physical Exam:  Vital signs in last 24 hours: Temp:  [97.8 F (36.6 C)-98.8 F (37.1 C)] 98.2 F (36.8 C) (01/22 1449) Pulse Rate:  [107-110] 109 (01/22 1449) Resp:  [16-17] 16 (01/22 1449) BP: (104-124)/(70-91) 104/74 (01/22 1449) SpO2:  [99 %-100 %] 99 % (01/22 1449) Weight:  [95.2 kg] 95.2 kg (01/22 0405) Last BM Date : 08/02/24 General:  Pleasant, cooperative in NAD Head:  Normocephalic and atraumatic. Eyes:   No icterus.   Conjunctiva pink. PERRLA. Ears:  Normal auditory acuity. Neck:  Supple; no masses or thyroidomegaly Lungs: Respirations even and unlabored. Lungs clear to auscultation bilaterally.   No wheezes, crackles, or rhonchi.  Heart:  Regular rate and rhythm;  Without murmur, clicks, rubs or gallops Abdomen:  Soft, distended with ascites, nontender. Normal bowel sounds. No appreciable masses or hepatomegaly.  No  rebound or guarding.  Rectal:  Not performed. Msk:  Symmetrical without gross deformities.   Extremities:  Without edema, cyanosis or clubbing. Neurologic:  Alert and oriented x3;  grossly normal neurologically. Skin:  Intact without significant lesions or rashes. Cervical Nodes:  No significant cervical adenopathy. Psych:  Alert and cooperative. Normal affect.  LAB RESULTS: Recent Labs    08/01/24 1603 08/02/24 0605  WBC 3.7* 3.3*  HGB 10.2* 9.2*  HCT 33.4* 29.5*  PLT 210 186   BMET Recent Labs    08/01/24 1603 08/02/24 0605  NA 142 142  K 3.5 3.4*  CL 108 106  CO2 27 30  GLUCOSE 113* 87  BUN 8 8  CREATININE 0.55* 0.53*  CALCIUM 8.0* 8.0*   LFT Recent Labs    08/02/24 0605  PROT 6.9  ALBUMIN  2.5*  AST 59*  ALT 26  ALKPHOS 126  BILITOT 0.6   PT/INR Recent Labs    08/01/24 1603  LABPROT 16.5*  INR 1.3*    STUDIES: US  Paracentesis Result Date: 08/02/2024 INDICATION: 69 year old male with a history of decompensated cirrhosis secondary to alcohol  use, colon cancer, recurrent ascites. Request for diagnostic and therapeutic paracentesis. EXAM: ULTRASOUND GUIDED DIAGNOSTIC AND THERAPEUTIC, RIGHT-SIDED PARACENTESIS MEDICATIONS: 1% lidocaine , 6 mL. COMPLICATIONS: None immediate. PROCEDURE: Informed written consent was obtained from the patient after a discussion of the risks, benefits and alternatives to treatment. A timeout was performed prior to the initiation of the procedure. Initial ultrasound scanning demonstrates a large amount of ascites within the right lower abdominal quadrant. The right lower abdomen was prepped and draped in the usual sterile fashion. 1% lidocaine  was used for local anesthesia. Following this, a 19 gauge, 7-cm, Yueh catheter was introduced. An ultrasound image was saved for documentation purposes. The paracentesis was performed. The catheter was removed and a dressing was applied. The patient tolerated the procedure well without immediate post  procedural complication. Patient received post-procedure intravenous albumin ; see nursing notes for details. FINDINGS: A total of approximately 7.7 L of clear yellow fluid was removed. Samples were sent to the laboratory as requested by the clinical team. IMPRESSION: Successful ultrasound-guided paracentesis yielding 7.7 liters of peritoneal fluid. PLAN: The patient has required >/=2 paracenteses in a 30 day period and a formal evaluation by the [facility] Interventional Radiology [facility] has been arranged. Procedure performed by: Sherrilee Bal, PA-C under the supervision of Dr. ONEIDA Specking Electronically Signed   By: CHRISTELLA.  Shick M.D.   On: 08/02/2024 16:20   DG Chest Portable 1 View Result Date: 08/01/2024 CLINICAL DATA:  Shortness of breath with bilateral lower extremity edema. EXAM: PORTABLE CHEST 1 VIEW COMPARISON:  07/05/2024 FINDINGS: Lungs are hypoinflated with mild hazy bilateral perihilar and bibasilar opacification suggesting mild interstitial edema and less likely infection. Small left pleural effusion. Cardiomediastinal silhouette and remainder of the exam is unchanged. IMPRESSION: Hypoinflation with mild hazy bilateral perihilar and bibasilar opacification suggesting mild interstitial edema and less likely infection. Small left pleural effusion. Electronically Signed   By: Toribio Agreste M.D.  On: 08/01/2024 16:40      Impression / Plan:   Assessment: Principal Problem:   Ascites due to alcoholic cirrhosis (HCC) Active Problems:   Iron  deficiency anemia due to chronic blood loss   Decompensated hepatic cirrhosis (HCC)   Alcohol  use disorder   Hypertension   COPD (chronic obstructive pulmonary disease) (HCC)   Pancytopenia (HCC)   Colon cancer (HCC)   Hospice care   Chronic diastolic CHF (congestive heart failure) (HCC)   Mahdi Frye is a 69 y.o. y/o male with with alcoholic cirrhosis with recurrent ascites and colon cancer with the patient not being a candidate  for chemotherapy or resection.  The patient has been put on hospice.  I am being asked to see the patient as to whether a TIPS could be beneficial for the patient.  Plan:  I spoke to the patient about the options of the PleurX catheter which can be placed but has a risk of infection, repeated paracentesis of the patient as needed to take the fluid off when it becomes uncomfortable which also has its risks including bleeding perforation and oozing from the site.  The patient has been told that a TIPS procedure can decrease the portal pressure thereby decreasing his need for paracentesis and hopefully the ascites would resolve.  The patient was told the risks of this procedure include worsening or exacerbating hepatic encephalopathy.  He was told that this is not guaranteed and that if it should happen they can clot off the stent if needed.  The patient states he does not want to have hepatic encephalopathy again and he does not want to experience the confusion he has had in the past. The patient has refused a TIPS procedure therefore whether repeat paracentesis or PleurX catheter is done will be dependent on the patient and IR recommendations.  Nothing further to do from a GI point of view.  Thank you for involving me in the care of this patient.      LOS: 2 days   Rogelia Copping, MD, MD. NOLIA 08/03/2024, 2:54 PM,  Pager [phone] 7am-5pm  Check AMION for 5pm -7am coverage and on weekends   Note: This dictation was prepared with Dragon dictation along with smaller phrase technology. Any transcriptional errors that result from this process are unintentional.       [1]  Social History Tobacco Use   Smoking status: Never   Smokeless tobacco: Never  Vaping Use   Vaping status: Never Used  Substance Use Topics   Alcohol  use: Yes    Alcohol /week: 10.0 standard drinks of alcohol     Types: 10 Cans of beer per week   Drug use: No   "

## 2024-08-03 NOTE — TOC Initial Note (Signed)
 Transition of Care [facility]) - Initial/Assessment Note    Patient Details  Name: Joel Harrell MRN: [id number] Date of Birth: 1956/02/07  [facility] CM/SW Contact:    Corean ONEIDA Haddock, RN Phone Number: 08/03/2024, 3:03 PM  Clinical Narrative:                 Patient confirms plan to return home with hospice services today through [facility] Patient will require EMS transport.  VM left for wife michelle to confirm she will be at home when he arrives.  Patient states that he has already spoken to her and confirmed she will be here Patient confirms he has all DME needed in the home  Marinell with [employer] aware of dc Signed DNR on chart.  Requested Unit clerk to place EMS packet with DNR [facility] transport called.  Patient currently 6th on the list        Patient Goals and CMS Choice            Expected Discharge Plan and Services         Expected Discharge Date: 08/03/24                                    Prior Living Arrangements/Services                       Activities of Daily Living   ADL Screening (condition at time of admission) Independently performs ADLs?: No Does the patient have a NEW difficulty with bathing/dressing/toileting/self-feeding that is expected to last >3 days?: Yes (Initiates electronic notice to provider for possible OT consult) Does the patient have a NEW difficulty with getting in/out of bed, walking, or climbing stairs that is expected to last >3 days?: Yes (Initiates electronic notice to provider for possible PT consult) Does the patient have a NEW difficulty with communication that is expected to last >3 days?: No Is the patient deaf or have difficulty hearing?: No Does the patient have difficulty seeing, even when wearing glasses/contacts?: No Does the patient have difficulty concentrating, remembering, or making decisions?: No  Permission Sought/Granted                  Emotional  Assessment              Admission diagnosis:  Ascites [R18.8] Patient Active Problem List   Diagnosis Date Noted   Ascites 08/01/2024   Hospice care 08/01/2024   Chronic diastolic CHF (congestive heart failure) (HCC) 08/01/2024   Esophageal varices (HCC) 04/20/2024   Pleural effusion 04/19/2024   Palliative care encounter 12/15/2023   Colon cancer (HCC) 12/14/2023   Adenocarcinoma of sigmoid colon (HCC) 12/14/2023   Goals of care, counseling/discussion 12/14/2023   Overweight (BMI 25.0-29.9) 12/13/2023   Neoplasm of digestive system 12/13/2023   Mass of colon 12/13/2023   Hypokalemia 12/12/2023   Hyponatremia 12/12/2023   Hypomagnesemia 12/12/2023   Hypophosphatemia 12/12/2023   Portal hypertension (HCC) 12/12/2023   Ascites due to alcoholic cirrhosis (HCC) 12/11/2023   Essential hypertension 12/11/2023   Pancytopenia (HCC) 12/11/2023   Acute blood loss anemia 12/11/2023   Iron  deficiency anemia 12/11/2023   GI bleeding 12/10/2023   Acute hepatic encephalopathy (HCC) 11/22/2022   Iron  deficiency anemia due to chronic blood loss 11/22/2022   Decompensated hepatic cirrhosis (HCC) 11/22/2022   Alcohol  use disorder 11/22/2022   Hypertension 11/22/2022   COPD (chronic  obstructive pulmonary disease) (HCC) 11/22/2022   PCP:  Patient, No Pcp Per Pharmacy:   [facility] - [place], [place] - [address] [place] [address] Phone: [phone] Fax: [phone]  [id number] [place], [place] - [address] AT [facility] OF [address] [place] [address] Phone: [phone] Fax: [phone]  [id number] [place], [place] - [address] AT [facility] OF [address] [place] [address] [place] [address] Phone: [phone] Fax: [phone]     Social Drivers of Health (SDOH) Social History: SDOH Screenings   Food Insecurity: No Food Insecurity (08/01/2024)  Housing: Low  Risk (08/01/2024)  Transportation Needs: No Transportation Needs (08/01/2024)  Utilities: Not At Risk (08/01/2024)  Social Connections: Moderately Isolated (08/01/2024)  Tobacco Use: Low Risk (08/01/2024)   SDOH Interventions:     Readmission Risk Interventions    12/15/2023    8:53 AM  Readmission Risk Prevention Plan  PCP or Specialist Appt within 3-5 Days Complete  HRI or Home Care Consult Complete  Social Work Consult for Recovery Care Planning/Counseling Complete  Palliative Care Screening Not Applicable

## 2024-08-03 NOTE — Progress Notes (Signed)
 Brief Interventional Radiology Note:  69 year old male with a history of alcoholic cirrhosis with associated recurrent ascites, hepatic encephalopathy, GI bleed, and colon cancer s/p palliative radiation. IR was initially consulted for consideration of peritoneal PleurX placement. Given the high risk of infection with tunneled peritoneal catheters and patient's low MELD and Child Pugh score patient IR attending requested GI consult for consideration of TIPS procedure.   Patient underwent large volume diagnostic and therapeutic paracentesis on 1/21 following decision to hold on PleurX placement.   GI evaluated today and, per note, patient was educated on TIPS procedure, risks, and benefits and has elected to forego the procedure at this time due to the risk of hepatic encephalopathy. Patient would still like to be considered for peritoneal PleurX placement. Patient is currently on Hospice with routine at home visits. IR will reach out to [facility] coordinator for outpatient consult and re-consideration of PleurX placement. Patient is agreeable to this plan.   Patient is scheduled for likely discharge later today.   Please feel free to reach out with any additional questions/concerns if IR can be of assistance.   Electronically Signed: Madiline Saffran M Ahmari Duerson, PA-C 08/03/2024, 3:43 PM

## 2024-08-03 NOTE — Progress Notes (Signed)
 " PROGRESS NOTE    Saw Mendenhall  FMW:[id number] DOB: [birth year] DOA: 08/01/2024 PCP: Patient, No Pcp Per    Brief Narrative:   69 y.o. 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   Patient was recently hospitalized from 10/7 - 10/12 due to pleural effusion and ascites secondary to decompensated alcoholic cirrhosis (end stage).  Patient had paracentesis and thoracentesis. Per discharge summary, patient was transitioned to full comfort care 10/11 after meeting with palliative and hospice, and discharged to hospice home.    Patient states that he has worsening abdominal distention, bilateral leg edema and SOB.  His SOB has been progressively worsening.  No cough or chest pain.  No fever or chills.  He has abdominal discomfort, no active abdominal pain.  He has loose stools due to lactulose  use, no nausea vomiting.  No symptoms of UTI.   Per previous discharge summary, patient was transitioned to full comfort care and is currently under hospice care, but patient still wants to have limited treatment, including paracentesis, antibiotics, albumin , diuretics and IV fluid as needed.   Assessment & Plan:   Principal Problem:   Ascites due to alcoholic cirrhosis (HCC) Active Problems:   Decompensated hepatic cirrhosis (HCC)   Chronic diastolic CHF (congestive heart failure) (HCC)   Iron  deficiency anemia due to chronic blood loss   Hypertension   COPD (chronic obstructive pulmonary disease) (HCC)   Pancytopenia (HCC)   Colon cancer (HCC)   Alcohol  use disorder   Hospice care  Ascites due to  alcoholic cirrhosis and decompensated hepatic cirrhosis [facility]): Patient has severely distended abdomen due to worsening ascites.  No abdominal pain. Pt is currently under hospice care, but he still wants to have limited treatment, including paracentesis, antibiotics, albumin , diuretics and IV fluid as needed.  His mental status is normal. Plan: Discussed with IR and hospice liaison team.  Likely the patient would benefit from placement of an indwelling peritoneal drain rather than paracentesis given symptoms and rapid reaccumulation of fluid.  Interventional radiology inquired as to whether the patient will be a potential TIPS candidate.  Gastroenterology engaged for comment.  Pending their evaluation and recommendations in order to determine most appropriate course of action.     Chronic diastolic CHF (congestive heart failure): 2D echo on 04/20/2024 showed EF of 60-85% with grade 1 diastolic dysfunction.  Patient has anasarca with SOB - Continue Lasix  20 mg twice daily for now   Iron  deficiency anemia due to chronic blood loss: Hemoglobin stable 10.2 (10.7 on 07/01/2024). -Iron  supplement was discontinued at the recent discharge due to comfort care   Hypertension: Not taking medications currently. -IV hydralazine  as needed in hospital   COPD (chronic obstructive pulmonary disease) (HCC) -Bronchodilators as needed Mucinex    Pancytopenia (HCC): WBC 3.7, hemoglobin stable as above, platelet 120. -Follow-up with CBC   Colon cancer [facility]): S/p of palliative radiation therapy, not a candidate for surgery and chemotherapy. -Continue home as needed oxycodone    Alcohol  use disorder -In remission for more than 2 years   Hospice care: - Patient is under hospice care currently - Hospice liaison engaged     DVT prophylaxis: SCD Code Status: DNR Family  Communication: None today Disposition Plan: Status is: Inpatient Remains inpatient appropriate because: Decompensated cirrhosis, symptomatic  ascites   Level of care: Telemetry  Consultants:  IR  Procedures:  None  Antimicrobials: None   Subjective: Seen and examined.  Resting in bed.  No visible distress.  Answers questions appropriately.  Objective: Vitals:   08/02/24 1707 08/02/24 2027 08/03/24 0405 08/03/24 0748  BP: 120/73 122/81 115/70 124/75  Pulse: (!) 108 (!) 108 (!) 110 (!) 107  Resp: 16 17  16   Temp: 97.8 F (36.6 C) 98.8 F (37.1 C) 98 F (36.7 C) 98 F (36.7 C)  TempSrc:    Oral  SpO2: 100% 100% 100% 99%  Weight:   95.2 kg   Height:        Intake/Output Summary (Last 24 hours) at 08/03/2024 1148 Last data filed at 08/03/2024 1012 Gross per 24 hour  Intake 480 ml  Output 2950 ml  Net -2470 ml   Filed Weights   08/01/24 2018 08/02/24 0318 08/03/24 0405  Weight: 104 kg 103 kg 95.2 kg    Examination:  General exam: No acute distress, appears chronically ill Respiratory system: Lung clear.  Normal work of breathing.  Room air Cardiovascular system: S1-S2, RRR, no murmurs, plus pitting edema BLE Gastrointestinal system: + Distention, tender to palpation, decreased bowel sounds Central nervous system: Alert and oriented. No focal neurological deficits. Extremities: Symmetric 5 x 5 power. Skin: No rashes, lesions or ulcers Psychiatry: Judgement and insight appear normal. Mood & affect appropriate.     Data Reviewed: I have personally reviewed following labs and imaging studies  CBC: Recent Labs  Lab 08/01/24 1603 08/02/24 0605  WBC 3.7* 3.3*  HGB 10.2* 9.2*  HCT 33.4* 29.5*  MCV 88.1 87.8  PLT 210 186   Basic Metabolic Panel: Recent Labs  Lab 08/01/24 1603 08/02/24 0605  NA 142 142  K 3.5 3.4*  CL 108 106  CO2 27 30  GLUCOSE 113* 87  BUN 8 8  CREATININE 0.55* 0.53*  CALCIUM 8.0* 8.0*  MG  --  1.5*   GFR: Estimated Creatinine Clearance: 102.7 mL/min (A) (by C-G formula based on SCr of 0.53 mg/dL (L)). Liver Function Tests: Recent Labs  Lab 08/01/24 1603  08/02/24 0605  AST 64* 59*  ALT 29 26  ALKPHOS 139* 126  BILITOT 0.7 0.6  PROT 7.5 6.9  ALBUMIN  2.5* 2.5*   Recent Labs  Lab 08/01/24 1603  LIPASE 69*   Recent Labs  Lab 08/01/24 2055  AMMONIA 30   Coagulation Profile: Recent Labs  Lab 08/01/24 1603  INR 1.3*   Cardiac Enzymes: No results for input(s): CKTOTAL, CKMB, CKMBINDEX, TROPONINI in the last 168 hours. BNP (last 3 results) Recent Labs    08/01/24 2055  PROBNP 176.0   HbA1C: No results for input(s): HGBA1C in the last 72 hours. CBG: No results for input(s): GLUCAP in the last 168 hours. Lipid Profile: No results for input(s): CHOL, HDL, LDLCALC, TRIG, CHOLHDL, LDLDIRECT in the last 72 hours. Thyroid Function Tests: No results for input(s): TSH, T4TOTAL, FREET4, T3FREE, THYROIDAB in the last 72 hours. Anemia Panel: No results for input(s): VITAMINB12, FOLATE, FERRITIN, TIBC, IRON , RETICCTPCT in the last 72 hours. Sepsis Labs: No results for input(s): PROCALCITON, LATICACIDVEN in the last 168 hours.  Recent Results (from the past 240 hours)  Body fluid culture w Gram Stain     Status: None (Preliminary result)   Collection Time: 08/02/24  3:20 PM   Specimen: PATH  Cytology Peritoneal fluid  Result Value Ref Range Status   Specimen Description   Final    PERITONEAL Performed at [facility], [address], [place], [place] [address]    Special Requests   Final    PATH CYTO PERI Performed at [facility], [address] [place], [place] [address]    Gram Stain NO WBC SEEN NO ORGANISMS SEEN   Final   Culture   Final    NO GROWTH < 12 HOURS Performed at [facility], [address], [place], [place] [address]    Report Status PENDING  Incomplete         Radiology Studies: US  Paracentesis Result Date: 08/02/2024 INDICATION: 69 year old male with a history of decompensated cirrhosis secondary to alcohol  use, colon  cancer, recurrent ascites. Request for diagnostic and therapeutic paracentesis. EXAM: ULTRASOUND GUIDED DIAGNOSTIC AND THERAPEUTIC, RIGHT-SIDED PARACENTESIS MEDICATIONS: 1% lidocaine , 6 mL. COMPLICATIONS: None immediate. PROCEDURE: Informed written consent was obtained from the patient after a discussion of the risks, benefits and alternatives to treatment. A timeout was performed prior to the initiation of the procedure. Initial ultrasound scanning demonstrates a large amount of ascites within the right lower abdominal quadrant. The right lower abdomen was prepped and draped in the usual sterile fashion. 1% lidocaine  was used for local anesthesia. Following this, a 19 gauge, 7-cm, Yueh catheter was introduced. An ultrasound image was saved for documentation purposes. The paracentesis was performed. The catheter was removed and a dressing was applied. The patient tolerated the procedure well without immediate post procedural complication. Patient received post-procedure intravenous albumin ; see nursing notes for details. FINDINGS: A total of approximately 7.7 L of clear yellow fluid was removed. Samples were sent to the laboratory as requested by the clinical team. IMPRESSION: Successful ultrasound-guided paracentesis yielding 7.7 liters of peritoneal fluid. PLAN: The patient has required >/=2 paracenteses in a 30 day period and a formal evaluation by the [facility] has been arranged. Procedure performed by: Sherrilee Bal, PA-C under the supervision of Dr. ONEIDA Specking Electronically Signed   By: CHRISTELLA.  Shick M.D.   On: 08/02/2024 16:20   DG Chest Portable 1 View Result Date: 08/01/2024 CLINICAL DATA:  Shortness of breath with bilateral lower extremity edema. EXAM: PORTABLE CHEST 1 VIEW COMPARISON:  07/05/2024 FINDINGS: Lungs are hypoinflated with mild hazy bilateral perihilar and bibasilar opacification suggesting mild interstitial edema and less likely infection.  Small left pleural effusion. Cardiomediastinal silhouette and remainder of the exam is unchanged. IMPRESSION: Hypoinflation with mild hazy bilateral perihilar and bibasilar opacification suggesting mild interstitial edema and less likely infection. Small left pleural effusion. Electronically Signed   By: Toribio Agreste M.D.   On: 08/01/2024 16:40        Scheduled Meds:  furosemide   40 mg Intravenous Q12H   lactulose   10 g Oral BID   Continuous Infusions:   LOS: 2 days      Calvin KATHEE Robson, MD [facility]   If 7PM-7AM, please contact night-coverage  08/03/2024, 11:48 AM   "

## 2024-08-03 NOTE — Progress Notes (Signed)
 [facility] transporting patient home at this time. IV removed. Patient stable and discharge packet provided.

## 2024-08-06 LAB — BODY FLUID CULTURE W GRAM STAIN
Culture: NO GROWTH
Gram Stain: NONE SEEN

## 2024-08-08 LAB — CYTOLOGY - NON PAP

## 2024-08-08 LAB — MISC LABCORP TEST (SEND OUT): Labcorp test code: 9985

## 2024-08-16 ENCOUNTER — Encounter: Payer: Self-pay | Admitting: Oncology

## 2024-08-17 ENCOUNTER — Encounter: Payer: Self-pay | Admitting: Oncology

## 2024-08-17 NOTE — Progress Notes (Signed)
 Joel Wilkie POUR, MD sent to Joel Harrell It's the same issue - cirrhotics are at increased risk for infection of their ascites at baseline b/c the elevated portal pressure makes it more likely for bacteria to translocate into the fluid.  Placing a Pleur-X catheter only adds to this risk.  If it's for a patient with a longer life expectancy they will almost certainly have issues.    If on hospice and with a limited life expectancy, then the drain can be considered palliative and the overall time its in place is less  HKM       Previous Messages    ----- Message ----- From: Joel Harrell Sent: 08/16/2024   9:21 AM EST To: Wilkie Harrell Karalee, MD Subject: ATTN: Dr VEAR Joel   IR peritoneal Pleur-*  IR Approval Request:   Procedure:    IR peritoneal Pleur-X Cath  Reason:       recurrent symptomatic ascites  History:        please see pt's chart  Provider:     Dr Deane Finder, MD  Provider Contact #    [facility]   [phone]

## 2024-08-25 ENCOUNTER — Ambulatory Visit
